# Patient Record
Sex: Female | Born: 1980 | Race: Black or African American | Hispanic: No | Marital: Single | Smoking: Never smoker
Health system: Southern US, Community
[De-identification: ages and names within clinical notes are randomized; demographics above are authoritative.]

## PROBLEM LIST (undated history)

## (undated) ENCOUNTER — Emergency Department (HOSPITAL_BASED_OUTPATIENT_CLINIC_OR_DEPARTMENT_OTHER): Admission: EM | Source: Home / Self Care

## (undated) DIAGNOSIS — R259 Unspecified abnormal involuntary movements: Secondary | ICD-10-CM

## (undated) DIAGNOSIS — J45909 Unspecified asthma, uncomplicated: Secondary | ICD-10-CM

## (undated) DIAGNOSIS — K219 Gastro-esophageal reflux disease without esophagitis: Secondary | ICD-10-CM

## (undated) DIAGNOSIS — D649 Anemia, unspecified: Secondary | ICD-10-CM

## (undated) DIAGNOSIS — F32A Depression, unspecified: Secondary | ICD-10-CM

## (undated) DIAGNOSIS — F419 Anxiety disorder, unspecified: Secondary | ICD-10-CM

## (undated) DIAGNOSIS — R209 Unspecified disturbances of skin sensation: Secondary | ICD-10-CM

## (undated) DIAGNOSIS — R202 Paresthesia of skin: Secondary | ICD-10-CM

## (undated) DIAGNOSIS — D259 Leiomyoma of uterus, unspecified: Secondary | ICD-10-CM

## (undated) DIAGNOSIS — R42 Dizziness and giddiness: Secondary | ICD-10-CM

## (undated) DIAGNOSIS — R51 Headache: Secondary | ICD-10-CM

## (undated) DIAGNOSIS — F329 Major depressive disorder, single episode, unspecified: Secondary | ICD-10-CM

## (undated) DIAGNOSIS — R251 Tremor, unspecified: Secondary | ICD-10-CM

## (undated) DIAGNOSIS — T7840XA Allergy, unspecified, initial encounter: Secondary | ICD-10-CM

## (undated) DIAGNOSIS — I10 Essential (primary) hypertension: Secondary | ICD-10-CM

## (undated) HISTORY — DX: Unspecified asthma, uncomplicated: J45.909

## (undated) HISTORY — DX: Tremor, unspecified: R25.1

## (undated) HISTORY — DX: Unspecified disturbances of skin sensation: R20.9

## (undated) HISTORY — DX: Gastro-esophageal reflux disease without esophagitis: K21.9

## (undated) HISTORY — DX: Unspecified abnormal involuntary movements: R25.9

## (undated) HISTORY — DX: Allergy, unspecified, initial encounter: T78.40XA

## (undated) HISTORY — DX: Depression, unspecified: F32.A

## (undated) HISTORY — PX: ABDOMINAL HYSTERECTOMY: SHX81

## (undated) HISTORY — DX: Dizziness and giddiness: R42

## (undated) HISTORY — DX: Headache: R51

## (undated) HISTORY — DX: Essential (primary) hypertension: I10

## (undated) HISTORY — DX: Paresthesia of skin: R20.2

## (undated) HISTORY — PX: MYOMECTOMY: SHX85

## (undated) HISTORY — PX: HERNIA REPAIR: SHX51

---
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
## 2004-12-26 ENCOUNTER — Emergency Department (HOSPITAL_COMMUNITY): Admission: EM | Admit: 2004-12-26 | Discharge: 2004-12-26 | Payer: Self-pay | Admitting: *Deleted

## 2009-08-12 ENCOUNTER — Encounter: Admission: RE | Admit: 2009-08-12 | Discharge: 2009-08-12 | Payer: Self-pay | Admitting: Family Medicine

## 2010-07-07 ENCOUNTER — Inpatient Hospital Stay (HOSPITAL_COMMUNITY)
Admission: AD | Admit: 2010-07-07 | Discharge: 2010-07-07 | Disposition: A | Discharge status: Home / Self Care | Payer: Medicaid Other | Source: Ambulatory Visit | Attending: Obstetrics & Gynecology | Admitting: Obstetrics & Gynecology

## 2010-07-07 ENCOUNTER — Inpatient Hospital Stay (HOSPITAL_COMMUNITY): Payer: Medicaid Other

## 2010-07-07 DIAGNOSIS — R109 Unspecified abdominal pain: Secondary | ICD-10-CM

## 2010-07-07 DIAGNOSIS — D259 Leiomyoma of uterus, unspecified: Secondary | ICD-10-CM

## 2010-07-07 LAB — CBC
Hemoglobin: 12.8 g/dL (ref 12.0–15.0)
MCHC: 32.8 g/dL (ref 30.0–36.0)
MCV: 88.8 fL (ref 78.0–100.0)
RBC: 4.39 MIL/uL (ref 3.87–5.11)
RDW: 12.8 % (ref 11.5–15.5)
WBC: 4.5 10*3/uL (ref 4.0–10.5)

## 2010-07-07 LAB — WET PREP, GENITAL
Trich, Wet Prep: NONE SEEN
Yeast Wet Prep HPF POC: NONE SEEN

## 2010-07-07 LAB — URINALYSIS, ROUTINE W REFLEX MICROSCOPIC
Bilirubin Urine: NEGATIVE
Nitrite: NEGATIVE
Protein, ur: NEGATIVE mg/dL
pH: 5.5 (ref 5.0–8.0)

## 2010-07-07 LAB — POCT PREGNANCY, URINE: Preg Test, Ur: NEGATIVE

## 2010-07-08 LAB — GC/CHLAMYDIA PROBE AMP, GENITAL: GC Probe Amp, Genital: NEGATIVE

## 2010-07-29 ENCOUNTER — Encounter (INDEPENDENT_AMBULATORY_CARE_PROVIDER_SITE_OTHER): Payer: Medicaid Other | Admitting: Obstetrics and Gynecology

## 2010-07-29 ENCOUNTER — Other Ambulatory Visit: Payer: Self-pay | Admitting: Obstetrics and Gynecology

## 2010-07-29 DIAGNOSIS — Z124 Encounter for screening for malignant neoplasm of cervix: Secondary | ICD-10-CM

## 2010-07-29 DIAGNOSIS — D219 Benign neoplasm of connective and other soft tissue, unspecified: Secondary | ICD-10-CM

## 2010-07-29 DIAGNOSIS — D259 Leiomyoma of uterus, unspecified: Secondary | ICD-10-CM

## 2010-07-29 DIAGNOSIS — Z01419 Encounter for gynecological examination (general) (routine) without abnormal findings: Secondary | ICD-10-CM

## 2010-08-02 ENCOUNTER — Other Ambulatory Visit: Payer: Self-pay | Admitting: Obstetrics and Gynecology

## 2010-08-02 DIAGNOSIS — D219 Benign neoplasm of connective and other soft tissue, unspecified: Secondary | ICD-10-CM

## 2010-08-03 ENCOUNTER — Other Ambulatory Visit: Payer: Medicaid Other

## 2010-08-20 ENCOUNTER — Ambulatory Visit (INDEPENDENT_AMBULATORY_CARE_PROVIDER_SITE_OTHER): Payer: Medicaid Other | Admitting: Obstetrics and Gynecology

## 2010-08-20 DIAGNOSIS — N949 Unspecified condition associated with female genital organs and menstrual cycle: Secondary | ICD-10-CM

## 2010-08-20 DIAGNOSIS — D259 Leiomyoma of uterus, unspecified: Secondary | ICD-10-CM

## 2010-08-27 NOTE — Progress Notes (Signed)
NAME:  Traci Frank, Traci Frank NO.:  [id number]  MEDICAL RECORD NO.:  [id number]           PATIENT TYPE:  A  LOCATION:  [facility]                   FACILITY:  [facility]  PHYSICIAN:  Catalina Antigua, MD     DATE OF BIRTH:  [birth year]  DATE OF SERVICE:                                 CLINIC NOTE  This is a 30 year old nulligravida patient with symptomatic fibroid uterus who has been experiencing irregular periods, but pelvic pain. The patient presents today requesting surgical intervention as to preserve her fertility.  She is currently using condoms for birth control.  PAST MEDICAL HISTORY:  Significant for borderline hypertension for which she is not taking any medication.  PAST SURGICAL HISTORY:  Significant for inguinal hernia repair.  PAST OB/GYN HISTORY:  She is nulligravida.  She denies any cyst or abnormal Pap smear.  She does have an approximately 12-13 weeks' size fibroid uterus.  SOCIAL HISTORY:  She denies drinking, smoking, or the use of illicit drugs.  FAMILY HISTORY:  Significant for diabetes and hypertension, and a great grandmother diagnosed with breast cancer.  REVIEW OF SYSTEMS:  Otherwise within normal limits.  PHYSICAL EXAMINATION:  VITAL SIGNS:  Her blood pressure is 140/92, pulse of 83, weight of 75.8 kg, height of 62 inches. LUNGS:  Clear to auscultation bilaterally. HEART:  Regular rate and rhythm. ABDOMEN:  Soft, nontender, nondistended. PELVIC:  On bimanual exam, she had approximately 16-week size palpable fibroid uterus.  No palpable adnexal masses or tenderness.  The patient had an ultrasound performed on July 07, 2010, which demonstrated the uterus measuring 13 x 7 x 5 cm with excluded a large subserosal fibroid which measured 13 x 10 x 11 cm and a second smaller fibroid which measured 4 cm.  The patient's ultrasound demonstrated normal left and right ovaries.  Endometrial lining was 11 mm.  ASSESSMENT AND PLAN:  This is a  30 year old nulligravida patient with symptomatic fibroid uterus who presents today requesting surgical intervention.  The patient was counseled regarding abdominal myomectomy. Risk, benefits, and alternatives were explained to the patient along with expected recovery time.  The patient verbalized understanding, but desires to have the procedure done robotically.  The patient was informed that she would have to reschedule the surgical consultation with Dr. Marice Potter for possible robotic procedure.  It was explained to the patient that given the large size of her fibroids, robotic procedure may or may not be possible.  The patient verbalized understanding, but still wishes to move forward with the surgical consultation with Dr. Marice Potter. The patient will be rescheduled therefore as per her request.  In the meantime, the patient was advised to follow up with her primary care physician agent for further evaluation of her blood pressure.          ______________________________ Catalina Antigua, MD    PC/MEDQ  D:  08/20/2010  T:  08/21/2010  Job:  [id number]

## 2010-08-27 NOTE — Progress Notes (Unsigned)
[name], [name] NO.:  [id number]  MEDICAL RECORD NO.:  [id number]           PATIENT TYPE:  A  LOCATION:  [facility]                   FACILITY:  [facility]  PHYSICIAN:  Argentina Donovan, MD        DATE OF BIRTH:  February 22, 1981  DATE OF SERVICE:  07/29/2010                                 CLINIC NOTE  The patient is a 30 year old, nulligravida, African American female who was referred by the [facility] because of pelvic pain and fibroids.  On examination, she has normal periods with a hemoglobin of 12.8.  She has variable abdominal pain and is otherwise in good health with a borderline high blood pressure.  At the present time, she is on no medication except Naprosyn which she takes for the cramps and pain.  She uses condoms for birth control and has an allergy to LATEX.  On examination, the abdomen is soft with an obvious bulge below the umbilicus with a mass that extends from the pelvis up to the umbilicus. It seems firm and regular in configuration.  And the external genitalia is normal.  BUS within normal limits.  Vagina is clean and well rugated. Pap smear was taken.  The cervix is closed and nulliparous and uterus feels like about an 18-week pregnancy.  Ultrasound shows an uterus measuring 12 x 7 x 5 with a large left-sided fibroid subserosal arising from the left uterine body which measures 39 x 10 x 11.6 and a second smaller fibroid seen in the right uterine body measuring 3.8.  The endometrium was fine and normal and both ovaries were normal.  Being that the patient has these large fibroids, we have talked to her about uterine artery embolizations and myomectomy since she is a nulligravida and desires children, she says at least one.  I am going to send her for a consultation with the interventional radiologist, have her then come back and see a surgeon unless she just therefore chooses to have the procedure done by Radiology and the Surgery.  Whether or not to use  Depo-Lupron I will leave up to the choice of the surgeon.  IMPRESSION:  Symptomatic leiomyomata uteri in a nulligravida female.          ______________________________ Argentina Donovan, MD    PR/MEDQ  D:  07/29/2010  T:  07/30/2010  Job:  [id number]

## 2010-09-06 ENCOUNTER — Ambulatory Visit: Payer: Medicaid Other | Admitting: Obstetrics & Gynecology

## 2013-05-27 ENCOUNTER — Encounter: Payer: Self-pay | Admitting: Neurology

## 2013-05-27 ENCOUNTER — Ambulatory Visit (INDEPENDENT_AMBULATORY_CARE_PROVIDER_SITE_OTHER): Payer: 59 | Admitting: Neurology

## 2013-05-27 ENCOUNTER — Telehealth: Payer: Self-pay | Admitting: Neurology

## 2013-05-27 ENCOUNTER — Encounter (INDEPENDENT_AMBULATORY_CARE_PROVIDER_SITE_OTHER): Payer: Self-pay

## 2013-05-27 VITALS — BP 156/102 | HR 78 | Temp 98.8°F | Ht 62.0 in | Wt 189.0 lb

## 2013-05-27 DIAGNOSIS — R202 Paresthesia of skin: Secondary | ICD-10-CM | POA: Insufficient documentation

## 2013-05-27 DIAGNOSIS — R259 Unspecified abnormal involuntary movements: Secondary | ICD-10-CM

## 2013-05-27 DIAGNOSIS — R253 Fasciculation: Secondary | ICD-10-CM

## 2013-05-27 DIAGNOSIS — R209 Unspecified disturbances of skin sensation: Secondary | ICD-10-CM

## 2013-05-27 DIAGNOSIS — R42 Dizziness and giddiness: Secondary | ICD-10-CM

## 2013-05-27 DIAGNOSIS — R51 Headache: Secondary | ICD-10-CM

## 2013-05-27 DIAGNOSIS — R251 Tremor, unspecified: Secondary | ICD-10-CM | POA: Insufficient documentation

## 2013-05-27 HISTORY — DX: Tremor, unspecified: R25.1

## 2013-05-27 HISTORY — DX: Paresthesia of skin: R20.2

## 2013-05-27 NOTE — Telephone Encounter (Signed)
Please call Dr. Delena Serve with [facility] to request lab test results. Please also fax my note to Dr. Delena Serve from today, thx

## 2013-05-27 NOTE — Patient Instructions (Addendum)
I think overall you are doing fairly well but I do want to suggest a few things today:  Remember to drink plenty of fluid, eat healthy meals and do not skip any meals. Try to eat protein with a every meal and eat a healthy snack such as fruit or nuts in between meals. Try to keep a regular sleep-wake schedule and try to exercise daily, particularly in the form of walking, 20-30 minutes a day, if you can.   Please remember, that any kind of tremor may be exacerbated by anxiety, anger, nervousness, excitement, dehydration, sleep deprivation, by caffeine, and low blood sugar values or blood sugar fluctuations. Some medications, especially some antidepressants and lithium can cause or exacerbate tremors. Tremors may temporarily calm down her subside with the use of a benzodiazepine such as Valium or related medications and with alcohol. Be aware however that drinking alcohol is not an approved treatment or appropriate treatment for tremor control and long-term use of benzodiazepines such as Valium, lorazepam, alprazolam, or clonazepam can cause habit formation, physical and psychological addiction.  As far as your medications are concerned, I would like to suggest no new medications.    As far as diagnostic testing: MRI brain with and without contrast. We will request blood test results from your family doctor. Please call us with the name of the provider you saw, so I can also send my note to your new PCP.  I would like to see you back in 3 months, sooner if we need to. Please call us with any interim questions, concerns, problems, updates or refill requests.  Please also call us for any test results so we can go over those with you on the phone. Brett Canales is my clinical assistant and will answer any of your questions and relay your messages to me and also relay most of my messages to you.  Our phone number is [phone]. We also have an after hours call service for urgent matters and there is a physician  on-call for urgent questions. For any emergencies you know to call 911 or go to the nearest emergency room.

## 2013-05-27 NOTE — Progress Notes (Signed)
Subjective:    Patient ID: Traci Frank is a 32 y.o. female.  HPI    Huston Foley, MD, PhD [facility] [address], [address] [address] [place], [place] [address]  Dear Dr. Emily Filbert,  I saw your patient, Traci Frank, upon your kind request in my neurologic clinic today for initial consultation of her dizziness, tingling, tremors. The patient is unaccompanied today. As you know, Traci Frank is a 32 year old right-handed woman with a benign underlying medical history who started having dizziness earlier this month. This was described as vertiginous and associated with headache and nausea. She has had paresthesias in her right arm and also noted that trembling in her right arm. She reported no eye pain or visual disturbance and no one-sided weakness. For her headache you gave her a prescription for hydrocodone and acetaminophen and for her nausea she was given a prescription for Zofran. She started having facial tingling last year, and it was intermittent. She also developed a right hand tremor around the same time. Sx became worse in the past 3 weeks and she has noted pain in her L arm. She also went to [facility] last week and they did blood work. She currently has all over tingling and R hand tremor.   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 She lives with her cousin. She has not had any new stressors. She has had a recurrent HA, bifrontal or around both eyes, associated with nausea, no vomiting, 3 times a week, lasting for 6 hours, Ibuprofen 600 mg helps. She describes the HA as throbbing and no aura. Her vertigo has improved on its own. She has had muscle twitching in various areas of the body.   Her Past Medical History Is Significant For: Past Medical History  Diagnosis Date  . Disturbance of skin sensation     Right arm  . Abnormal involuntary movements(781.0)     Right hand  . Headache(784.0)   . Vertigo     Her  Past Surgical History Is Significant For: Past Surgical History  Procedure Laterality Date  . Myomectomy      Her Family History Is Significant For: Family History  Problem Relation Age of Onset  . Diabetes Mother   . Hypertension Mother     Her Social History Is Significant For: History   Social History  . Marital Status: Single    Spouse Name: N/A    Number of Children: N/A  . Years of Education: N/A   Social History Main Topics  . Smoking status: Never Smoker   . Smokeless tobacco: None  . Alcohol Use: 0.0 oz/week    .25 drink(s) per week  . Drug Use: No  . Sexual Activity: None   Other Topics Concern  . None   Social History Narrative  . None    Her Allergies Are:  Allergies  Allergen Reactions  . Latex Itching  :   Her Current Medications Are:  Outpatient Encounter Prescriptions as of 05/27/2013  Medication Sig  . ibuprofen (ADVIL,MOTRIN) 200 MG tablet Take 200 mg by mouth every 6 (six) hours as needed.   Review of Systems:  Out of a complete 14 point review of systems, all are reviewed and negative with the exception of these symptoms as listed below:   Review of Systems  Constitutional: Positive for fatigue.  HENT: Positive for rhinorrhea and tinnitus.   Eyes: Negative.   Respiratory: Negative.   Cardiovascular:  Negative.   Gastrointestinal: Negative.   Endocrine: Negative.   Genitourinary: Negative.   Musculoskeletal: Positive for myalgias.  Skin: Negative.   Allergic/Immunologic: Negative.   Neurological: Positive for tremors, numbness and headaches.  Hematological: Negative.   Psychiatric/Behavioral: Positive for sleep disturbance (insomnia, snoring,shift work,sleepiness).    Objective:  Neurologic Exam  Physical Exam Physical Examination:   Filed Vitals:   05/27/13 0934  BP: 156/102  Pulse: 78  Temp: 98.8 F (37.1 C)    General Examination: The patient is a very pleasant 32 y.o. female in no acute distress. She appears  well-developed and well-nourished and well groomed. She is mildly anxious appearing.   HEENT: Normocephalic, atraumatic, pupils are equal, round and reactive to light and accommodation. Funduscopic exam is normal with sharp disc margins noted. Extraocular tracking is good without limitation to gaze excursion or nystagmus noted. Normal smooth pursuit is noted. Hearing is grossly intact. Tympanic membranes are clear bilaterally. Face is symmetric with normal facial animation and normal facial sensation. Speech is clear with no dysarthria noted. There is no hypophonia. There is no lip, neck/head, jaw or voice tremor. Neck is supple with full range of passive and active motion. There are no carotid bruits on auscultation. Oropharynx exam reveals: mild mouth dryness, adequate dental hygiene and mild airway crowding. Mallampati is class II. Tongue protrudes centrally and palate elevates symmetrically. Tonsils are 1+ in size.    Chest: Clear to auscultation without wheezing, rhonchi or crackles noted.  Heart: S1+S2+0, regular and normal without murmurs, rubs or gallops noted.   Abdomen: Soft, non-tender and non-distended with normal bowel sounds appreciated on auscultation.  Extremities: There is no pitting edema in the distal lower extremities bilaterally. Pedal pulses are intact.  Skin: Warm and dry without trophic changes noted. There are no varicose veins.  Musculoskeletal: exam reveals no obvious joint deformities, tenderness or joint swelling or erythema.   Neurologically:  Mental status: The patient is awake, alert and oriented in all 4 spheres. Her memory, attention, language and knowledge are appropriate. There is no aphasia, agnosia, apraxia or anomia. Speech is clear with normal prosody and enunciation. Thought process is linear. Mood is congruent and affect is normal.  Cranial nerves are as described above under HEENT exam. In addition, shoulder shrug is normal with equal shoulder height  noted. Motor exam: Normal bulk, strength and tone is noted. There is no drift, resting tremor or rebound. There is a RUE mild postural and action tremor, this is intermittent. There tremor frequency is fairly fast and the amplitude is small. On Archimedes spiral drawing there is mild tremulousness noted only on the right Handwriting is mildly tremulousness, but legible. There is no evidence of micrographia.  Romberg is negative. Reflexes are 2+ throughout. Toes are downgoing bilaterally. Fine motor skills are intact with normal finger taps, normal hand movements, normal rapid alternating patting, normal foot taps and normal foot agility. The only finding as she has mild hesitation with finger tapping on the right. There is no decrement in amplitude. Cerebellar testing shows no dysmetria or intention tremor on finger to nose testing. Heel to shin is unremarkable bilaterally. There is no truncal or gait ataxia.  Sensory exam is intact to light touch, pinprick, vibration, temperature sense in the upper and lower extremities.  Gait, station and balance are unremarkable. No veering to one side is noted. No leaning to one side is noted. Posture is age-appropriate and stance is narrow based. No problems turning are noted. She turns en bloc.  Tandem walk is unremarkable. Intact toe and heel stance is noted.               Assessment and Plan:   In summary, Traci Frank is a very pleasant 32 y.o.-year old female with a history of tremor, dizziness, tingling, and recurrent headaches. Her exam is for the most part non-focal with the exception of an intermittent mild right upper extremity tremor with action and posture. This is a faster tremor, with smaller amplitude. I reassured the patient in that she does not have any other tremors or focal findings were parkinsonian signs.   I had a long chat with the patient about my findings and her symptoms. I am not sure how to tie this all together at this time. I advised  her that we will try to get blood work results from her family physician at [facility]. She is going to call his back with the name of the provider she saw. She does indicate she has had thyroid function tests and blood chemistry as well as CBC at their office. I would like to proceed with a brain MRI with and without contrast as differential diagnosis is broad in her case. She could have complicated migraines, symptoms of MS, TIA is less likely and also manifestation of stress. We will call her back with the MRI results as well as additional blood work that may need to be done. I will hold off on doing blood work just yet. We will make an appointment for her for the MRI and for me to see her back in followup. In the interim, based on the available test results if we need to do additional testing such as spinal fluid testing or C-spine MRI we will proceed with that as well. She was in agreement.  As far as medications are concerned, I recommended the following at this time: I did not recommend any new medication.  I answered all her questions today.  Thank you very much for allowing me to participate in the care of this nice patient. If I can be of any further assistance to you please do not hesitate to call me at [phone].  Sincerely,   Huston Foley, MD, PhD

## 2013-05-27 NOTE — Telephone Encounter (Signed)
FYI--WAS SEEN THIS MORNING--PHYSICIAN W/[facility] @ [facility] IS DR. Toni Amend [place] WHERE SHE HAD LAB WORK DONE.

## 2013-06-04 ENCOUNTER — Telehealth: Payer: Self-pay | Admitting: Neurology

## 2013-06-04 NOTE — Telephone Encounter (Signed)
Addendum:  Patient was called back and told she needed to stop by the office to sign a records release form and pay the $20 fee before processing. Patient stated she would stop by Monday morning.

## 2013-06-04 NOTE — Telephone Encounter (Signed)
Patient requesting FMLA papers be filled out before MRI is scheduled as her insurance company called requesting paperwork. Patient is faxing over FMLA papers to [place] today. Please call back to advise.

## 2013-06-10 ENCOUNTER — Other Ambulatory Visit: Payer: Medicaid Other

## 2013-06-13 ENCOUNTER — Ambulatory Visit
Admission: RE | Admit: 2013-06-13 | Discharge: 2013-06-13 | Disposition: A | Discharge status: Home / Self Care | Payer: 59 | Source: Ambulatory Visit | Attending: Neurology | Admitting: Neurology

## 2013-06-13 DIAGNOSIS — R519 Headache, unspecified: Secondary | ICD-10-CM

## 2013-06-13 DIAGNOSIS — R251 Tremor, unspecified: Secondary | ICD-10-CM

## 2013-06-13 DIAGNOSIS — R253 Fasciculation: Secondary | ICD-10-CM

## 2013-06-13 DIAGNOSIS — R42 Dizziness and giddiness: Secondary | ICD-10-CM

## 2013-06-13 DIAGNOSIS — R209 Unspecified disturbances of skin sensation: Secondary | ICD-10-CM

## 2013-06-13 DIAGNOSIS — R51 Headache: Secondary | ICD-10-CM

## 2013-06-13 DIAGNOSIS — R202 Paresthesia of skin: Secondary | ICD-10-CM

## 2013-06-13 MED ORDER — GADOBENATE DIMEGLUMINE 529 MG/ML IV SOLN
18.0000 mL | Freq: Once | INTRAVENOUS | Status: AC | PRN
  Administered 2013-06-13: 18 mL via INTRAVENOUS

## 2013-06-14 DIAGNOSIS — Z0289 Encounter for other administrative examinations: Secondary | ICD-10-CM

## 2013-06-17 NOTE — Progress Notes (Signed)
Quick Note:  Please call and advise the patient that the recent scan we did was within normal limits. We did a brain MRI with and without contrast, which showed normal findings. In particular, there were no acute findings, such as a stroke, or mass or blood products or abnormal contrast uptake. No further action is required on this test at this time. Please remind patient to keep any upcoming appointments or tests and to call us with any interim questions, concerns, problems or updates. Thanks,  Star Age, MD, PhD   ______

## 2013-06-18 ENCOUNTER — Telehealth: Payer: Self-pay

## 2013-06-18 NOTE — Telephone Encounter (Signed)
Message copied by [facility] on Tue Jun 18, 2013 11:41 AM ------      Message from: Star Age      Created: Mon Jun 17, 2013  5:33 PM       Please call and advise the patient that the recent scan we did was within normal limits. We did a brain MRI with and without contrast, which showed normal findings. In particular, there were no acute findings, such as a stroke, or mass or blood products or abnormal contrast uptake. No further action is required on this test at this time. Please remind patient to keep any upcoming appointments or tests and to call us with any interim questions, concerns, problems or updates. Thanks,       Star Age, MD, PhD             ------

## 2013-06-18 NOTE — Telephone Encounter (Signed)
Patient received call from our office without message. We reviewed the results of her MRI. Patient stated that her symptoms have not improved. She stated that she would see about seeing a different doctor to see what they can find. I reviewed last OV note to see what Dr. Guadelupe Sabin plan was for this patient. I see that patient was going to get back top [place] with the name of a new primary care physician. I asked the patient about this. She stated that she was going to go with [employer]. I urged her to call us when she had the name of the physician who would see her. We can then forward out findings and notes and they can manage her care. That for her to bounce from doctor to doctor to find out the root cause of her symptoms is not prudent. Patient agrees to do this.  I reviewed with her that she is scheduled for a follow up on Oct 10 2013 at 2:30 p.m.

## 2013-06-24 ENCOUNTER — Telehealth: Payer: Self-pay

## 2013-06-24 NOTE — Telephone Encounter (Signed)
I spoke with Dr. Rexene Alberts about patient request to complete FMLA forms. She reviewed last office note and felt there was no justification for FMLA. Dr. Rexene Alberts stated she would be glad to have patient come in and she would re-evaluate her.   I called the patient to ascertain what was the purpose of the FMLA forms. Patient stated that she has been sent home from work a number of times and to Urgent Care because of her ongoing symptoms. She said they kept her out of work for the rest of that particular week. She needs FMLA so she won't get written up at work. I let her know we could not complete these forms at this time with the information from Dr. Tori Milks last assessment. I asked patient if she would like to schedule a sooner appointment and Dr. Rexene Alberts can re-evaluate her. Patient stated that she is having the same symptoms, nothing has changed. I asked if she could get the form completed by the Doctor at Urgent Care, since that is the person who took her out on any leave. Patient stated that they would not do it and advised them to have Neurologist complete form.   She will find someone else to give her a second opinion.   Patient asked if she could come pick up her forms and get a refund. I let her know I will leave forms up front for her to pick up. I will notify billing and they will make a request to have refund sent to patient.

## 2013-06-24 NOTE — Telephone Encounter (Signed)
We are unable to complete FMLA forms, based on finding of last (first) office visit. I have notified patient and she is requesting refund of her payment.

## 2013-06-24 NOTE — Telephone Encounter (Signed)
Note reviewed.

## 2013-10-10 ENCOUNTER — Ambulatory Visit: Payer: 59 | Admitting: Neurology

## 2014-01-27 ENCOUNTER — Other Ambulatory Visit: Payer: Self-pay | Admitting: Family Medicine

## 2014-01-27 ENCOUNTER — Other Ambulatory Visit (HOSPITAL_COMMUNITY)
Admission: RE | Admit: 2014-01-27 | Discharge: 2014-01-27 | Disposition: A | Discharge status: Home / Self Care | Payer: 59 | Source: Ambulatory Visit | Attending: Family Medicine | Admitting: Family Medicine

## 2014-01-27 DIAGNOSIS — Z124 Encounter for screening for malignant neoplasm of cervix: Secondary | ICD-10-CM | POA: Diagnosis not present

## 2014-01-27 DIAGNOSIS — Z1151 Encounter for screening for human papillomavirus (HPV): Secondary | ICD-10-CM | POA: Diagnosis present

## 2014-01-28 ENCOUNTER — Other Ambulatory Visit: Payer: Self-pay | Admitting: Family Medicine

## 2014-01-28 DIAGNOSIS — R202 Paresthesia of skin: Principal | ICD-10-CM

## 2014-01-28 DIAGNOSIS — R200 Anesthesia of skin: Secondary | ICD-10-CM

## 2014-01-29 LAB — CYTOLOGY - PAP

## 2014-02-17 ENCOUNTER — Ambulatory Visit
Admission: RE | Admit: 2014-02-17 | Discharge: 2014-02-17 | Disposition: A | Discharge status: Home / Self Care | Payer: 59 | Source: Ambulatory Visit | Attending: Family Medicine | Admitting: Family Medicine

## 2014-02-17 DIAGNOSIS — R200 Anesthesia of skin: Secondary | ICD-10-CM

## 2014-02-17 DIAGNOSIS — R202 Paresthesia of skin: Principal | ICD-10-CM

## 2014-03-17 ENCOUNTER — Ambulatory Visit: Payer: 59 | Admitting: Neurology

## 2014-03-17 ENCOUNTER — Telehealth: Payer: Self-pay | Admitting: Neurology

## 2014-03-17 NOTE — Telephone Encounter (Signed)
Pt no showed today's appt due to car trouble. Dr. Tomi Likens gave OK to r/s appt / Sherri S.

## 2014-04-28 ENCOUNTER — Encounter: Payer: Self-pay | Admitting: Neurology

## 2014-04-28 ENCOUNTER — Ambulatory Visit (INDEPENDENT_AMBULATORY_CARE_PROVIDER_SITE_OTHER): Payer: 59 | Admitting: Neurology

## 2014-04-28 VITALS — BP 140/92 | HR 78 | Temp 98.2°F | Resp 20 | Ht 62.0 in | Wt 187.2 lb

## 2014-04-28 DIAGNOSIS — R202 Paresthesia of skin: Secondary | ICD-10-CM

## 2014-04-28 DIAGNOSIS — M62838 Other muscle spasm: Secondary | ICD-10-CM

## 2014-04-28 DIAGNOSIS — R251 Tremor, unspecified: Secondary | ICD-10-CM

## 2014-04-28 LAB — CK: CK TOTAL: 85 U/L (ref 7–177)

## 2014-04-28 LAB — FOLATE: FOLATE: 11.4 ng/mL

## 2014-04-28 LAB — MAGNESIUM: MAGNESIUM: 1.9 mg/dL (ref 1.5–2.5)

## 2014-04-28 NOTE — Progress Notes (Signed)
NEUROLOGY CONSULTATION NOTE  Traci Frank MRN: [id number] DOB: 25-Apr-1981  Referring provider: Ms. Rolland Porter Primary care provider: Ms. Rolland Porter  Reason for consult:  Tremor, paresthesias  HISTORY OF PRESENT ILLNESS: Traci Frank is a 33 year old right-handed woman with headache  who presents for tremor and numbness and tingling.  Records and images personally reviewed.  In late 2014, she began experiencing several symptoms.  She developed a tremor in the right hand.  Her right arm would start feeling tired and then the hand would start to tremor.  It would occur spontaneously and was not triggered by any particular activity, however it caused difficulty typing at work.  At first, it would last up to 5 hours, however it is now quite infrequent and only occurs once in awhile.  Now, she says her right hand usually feels ice-cold at times.  There is no associated neck pain or pain radiating down the arm.  There is no weakness.  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  She had an ultrasound which revealed no mass.  She also reports tingling of the entire body.  At first, it only involved the cheeks, but then spread to involve the entire body.  Symptoms are constant.  There is no associated pain or numbness. She also experiences muscle jerks or spasms, most pronounced when she is inactive in bed.  It would either present as small spasms of different muscles to jerking of the entire limb.  She was evaluated at [facility] in December 2014.  An MRI of the brain with and without contrast was performed on 06/17/13, which was normal.  Labs from December 2014 showed normal CBC and CMP.  B12 was 297 and TSH was 1.33.  A diagnosis was never made.  She reports that she has some family members with history of tremor.  PAST MEDICAL HISTORY: Past Medical History  Diagnosis Date    . Disturbance of skin sensation     Right arm  . Abnormal involuntary movements(781.0)     Right hand  . Headache(784.0)   . Vertigo   . Tingling sensation 05/27/2013  . Tremor of unknown origin 05/27/2013    PAST SURGICAL HISTORY: Past Surgical History  Procedure Laterality Date  . Myomectomy      MEDICATIONS: Current Outpatient Prescriptions on File Prior to Visit  Medication Sig Dispense Refill  . ibuprofen (ADVIL,MOTRIN) 200 MG tablet Take 200 mg by mouth every 6 (six) hours as needed.     No current facility-administered medications on file prior to visit.    ALLERGIES: Allergies  Allergen Reactions  . Latex Itching    FAMILY HISTORY: Family History  Problem Relation Age of Onset  . Hypertension Mother   . Diabetes Maternal Grandmother   . Diabetes Maternal Grandfather   . Stroke Paternal Grandfather     SOCIAL HISTORY: History   Social History  . Marital Status: Single    Spouse Name: N/A    Number of Children: N/A  . Years of Education: N/A   Occupational History  . Not on file.   Social History Main Topics  . Smoking status: Never Smoker   . Smokeless tobacco: Never Used  . Alcohol Use: 0.0 oz/week    0 Not specified per week     Comment: 1 drink a month  . Drug Use: No  . Sexual Activity:  Partners: Male   Other Topics Concern  . Not on file   Social History Narrative    REVIEW OF SYSTEMS: Constitutional: No fevers, chills, or sweats, no generalized fatigue, change in appetite Eyes: No visual changes, double vision, eye pain Ear, nose and throat: No hearing loss, ear pain, nasal congestion, sore throat Cardiovascular: No chest pain, palpitations Respiratory:  No shortness of breath at rest or with exertion, wheezes GastrointestinaI: No nausea, vomiting, diarrhea, abdominal pain, fecal incontinence Genitourinary:  No dysuria, urinary retention or frequency Musculoskeletal:  No neck pain, back pain Integumentary: No rash,  pruritus, skin lesions Neurological: as above Psychiatric: No depression, insomnia, anxiety Endocrine: No palpitations, fatigue, diaphoresis, mood swings, change in appetite, change in weight, increased thirst Hematologic/Lymphatic:  No anemia, purpura, petechiae. Allergic/Immunologic: no itchy/runny eyes, nasal congestion, recent allergic reactions, rashes  PHYSICAL EXAM: Filed Vitals:   04/28/14 1306  BP: 140/92  Pulse: 78  Temp: 98.2 F (36.8 C)  Resp: 20   General: No acute distress Head:  Normocephalic/atraumatic Eyes:  fundi unremarkable, without vessel changes, exudates, hemorrhages or papilledema. CN III, IV, VI:  full range of motion, no nystagmus, no ptosis Neck: supple, no paraspinal tenderness, full range of motion Back: No paraspinal tenderness Heart: regular rate and rhythm Lungs: Clear to auscultation bilaterally. Vascular: No carotid bruits. Neurological Exam: Mental status: alert and oriented to person, place, and time, recent and remote memory intact, fund of knowledge intact, attention and concentration intact, speech fluent and not dysarthric, language intact. Cranial nerves: CN I: not tested CN II: pupils equal, round and reactive to light, visual fields intact, fundi unremarkable, without vessel changes, exudates, hemorrhages or papilledema. CN III, IV, VI:  full range of motion, no nystagmus, no ptosis CN V: facial sensation intact CN VII: upper and lower face symmetric CN VIII: hearing intact CN IX, X: gag intact, uvula midline CN XI: sternocleidomastoid and trapezius muscles intact CN XII: tongue midline Bulk & Tone: normal, no fasciculations. Motor:  5/5 throughout Sensation:  Reduced pinprick over dorsum of right hand and (insignificant) left arm.  Vibration intact. Deep Tendon Reflexes:  2+ throughout, toes downgoing Finger to nose testing:  No dysmetria Gait:  Normal station and stride.  Able to turn and walk in tandem. Romberg  negative.  IMPRESSION: Intermittent right hand tremor, not present on evaluation Muscle spasms or jerks Paresthesias involving the entire body  PLAN: 1. Will get NCV-EMG of the right upper and lower extremities. 2.  Check CK, Mg, folate 3.  Follow up after tests.  Thank you for allowing me to take part in the care of this patient.  Metta Clines, DO  CC:  Marda Stalker, PA

## 2014-04-28 NOTE — Patient Instructions (Signed)
Not really sure at this time what the cause is. 1.  We will check CK, Mg, and folate 2.  We will check a nerve conduction study of the right upper and lower extremities  3.  Follow up afterwards.

## 2014-04-29 ENCOUNTER — Telehealth: Payer: Self-pay | Admitting: *Deleted

## 2014-04-29 NOTE — Telephone Encounter (Signed)
-----   Message from Dudley Major, DO sent at 04/29/2014  7:26 AM EST ----- Labs are okay ----- Message -----    From: Lab in [facility] Interface    Sent: 04/28/2014  10:17 PM      To: Dudley Major, DO

## 2014-04-29 NOTE — Telephone Encounter (Signed)
Patient is aware of normal labs  

## 2014-06-12 ENCOUNTER — Ambulatory Visit (INDEPENDENT_AMBULATORY_CARE_PROVIDER_SITE_OTHER): Payer: 59 | Admitting: Neurology

## 2014-06-12 DIAGNOSIS — R202 Paresthesia of skin: Secondary | ICD-10-CM

## 2014-06-12 DIAGNOSIS — M62838 Other muscle spasm: Secondary | ICD-10-CM

## 2014-06-12 DIAGNOSIS — R251 Tremor, unspecified: Secondary | ICD-10-CM

## 2014-06-12 NOTE — Procedures (Signed)
[facility]  [address], [address]  [place], [place] [address] Tel: [phone] Fax:  [phone] Test Date:  06/12/2014  Patient: Traci Frank DOB: 07-Jun-1980 Physician: Narda Amber  Sex: Female Height: 5\' 2"  Ref Phys: Metta Clines  ID#: [id number] Temp: 32.0C Technician: Laureen Ochs R. NCS T.   Patient Complaints: Patient is a 34 year old female here for evaluation of whole-body paresthesias and muscle spasms.  NCV & EMG Findings: Extensive electrodiagnostic testing of the right upper and lower extremity shows: 1. Right median, ulnar, radial, and palmar sensory responses are within normal limits.  2. Right median and ulnar motor responses are within normal limits.  3. Right sural, superficial peroneal, and medial plantar response is within normal limits. 4. Right peroneal and tibial motor responses are within normal limits. 5. No evidence of active or chronic motor axon loss changes affecting any of the tested muscles. Motor unit configuration and recruitment pattern is normal.  Impression: This is a normal study of the right upper and lower extremity.  In particular, there is no evidence of a generalized sensorimotor polyneuropathy, cervical/lumbosacral radiculopathy, carpal tunnel syndrome, or diffuse myopathy.   ___________________________ Narda Amber    Nerve Conduction Studies Anti Sensory Summary Table   Stim Site NR Peak (ms) Norm Peak (ms) P-T Amp (V) Norm P-T Amp  Right Median Anti Sensory (2nd Digit)  32C  Wrist    3.1 <3.4 73.1 >20  Right Radial Anti Sensory (Base 1st Digit)  32C  Wrist    2.3 <2.7 48.4 >18  Right Sup Peroneal Anti Sensory (Ant Lat Mall)  32C  12 cm    2.4 <4.5 18.3 >5  Right Sural Anti Sensory (Lat Mall)  32C  Calf    3.0 <4.5 12.3 >5  Right Ulnar Anti Sensory (5th Digit)  32C  Wrist    2.6 <3.1 65.7 >12   Ortho Sensory Summary Table   Stim Site NR Peak (ms) Norm Peak (ms) P-T Amp (V) Norm P-T Amp  Right Medial  Plantar Ortho Sensory (Med Malleolus)  32C  Digit 1    3.4  20.3    Motor Summary Table   Stim Site NR Onset (ms) Norm Onset (ms) O-P Amp (mV) Norm O-P Amp Site1 Site2 Delta-0 (ms) Dist (cm) Vel (m/s) Norm Vel (m/s)  Right Median Motor (Abd Poll Brev)  32C  Wrist    3.3 <3.9 17.3 >6 Elbow Wrist 4.2 27.0 64 >50  Elbow    7.5  16.1         Right Peroneal Motor (Ext Dig Brev)  32C  Ankle    4.0 <5.5 7.5 >3 B Fib Ankle 5.6 28.0 50 >40  B Fib    9.6  6.9  Poplt B Fib 1.9 9.0 47 >40  Poplt    11.5  7.0         Right Peroneal TA Motor (Tib Ant)  32C  Fib Head    2.8 <4.0 6.5 >4 Poplit Fib Head 0.9 6.0 67 >40  Poplit    3.7  6.2         Right Tibial Motor (Abd Hall Brev)  32C  Ankle    4.1 <6.0 14.0 >8 Knee Ankle 6.8 35.0 51 >40  Knee    10.9  13.7         Right Ulnar Motor (Abd Dig Minimi)  32C  Wrist    3.1 <3.1 12.9 >7 B Elbow Wrist 3.7 21.0 57 >50  B Elbow    6.8  12.3  A Elbow B Elbow 1.5 10.0 67 >50  A Elbow    8.3  12.3          Comparison Summary Table   Stim Site NR Peak (ms) Norm Peak (ms) P-T Amp (V) Site1 Site2 Delta-P (ms) Norm Delta (ms)  Right Median/Ulnar Palm Comparison (Wrist - 8cm)  32C  Median Palm    1.7 <2.2 87.5 Median Palm Ulnar Palm 0.0   Ulnar Palm    1.7 <2.2 15.6       F Wave Studies   NR F-Lat (ms) Lat Norm (ms) L-R F-Lat (ms)  Right Ulnar (Mrkrs) (Abd Dig Min)  32C     24.71 <33    EMG   Side Muscle Ins Act Fibs Psw Fasc Number Recrt Dur Dur. Amp Amp. Poly Poly. Comment  Right AntTibialis Nml Nml Nml Nml Nml Nml Nml Nml Nml Nml Nml Nml N/A  Right Gastroc Nml Nml Nml Nml Nml Nml Nml Nml Nml Nml Nml Nml N/A  Right Flex Dig Long Nml Nml Nml Nml Nml Nml Nml Nml Nml Nml Nml Nml N/A  Right RectFemoris Nml Nml Nml Nml Nml Nml Nml Nml Nml Nml Nml Nml N/A  Right BicepsFemS Nml Nml Nml Nml Nml Nml Nml Nml Nml Nml Nml Nml N/A  Right GluteusMed Nml Nml Nml Nml Nml Nml Nml Nml Nml Nml Nml Nml N/A  Right 1stDorInt Nml Nml Nml Nml Nml Nml Nml Nml Nml Nml  Nml Nml N/A  Right Ext Indicis Nml Nml Nml Nml Nml Nml Nml Nml Nml Nml Nml Nml N/A  Right PronatorTeres Nml Nml Nml Nml Nml Nml Nml Nml Nml Nml Nml Nml N/A  Right Abd Poll Brev Nml Nml Nml Nml Nml Nml Nml Nml Nml Nml Nml Nml N/A  Right Biceps Nml Nml Nml Nml Nml Nml Nml Nml Nml Nml Nml Nml N/A  Right Triceps Nml Nml Nml Nml Nml Nml Nml Nml Nml Nml Nml Nml N/A  Right Deltoid Nml Nml Nml Nml Nml Nml Nml Nml Nml Nml Nml Nml N/A      Waveforms:

## 2014-06-13 ENCOUNTER — Telehealth: Payer: Self-pay | Admitting: *Deleted

## 2014-06-13 NOTE — Telephone Encounter (Signed)
Patient is aware of normal eeg

## 2014-06-13 NOTE — Telephone Encounter (Signed)
-----   Message from Dudley Major, DO sent at 06/13/2014  6:39 AM EST ----- ncv-emg normal ----- Message -----    From: Alda Berthold, DO    Sent: 06/12/2014   2:32 PM      To: Dudley Major, DO

## 2016-07-05 DIAGNOSIS — I10 Essential (primary) hypertension: Secondary | ICD-10-CM | POA: Insufficient documentation

## 2016-07-05 DIAGNOSIS — K5904 Chronic idiopathic constipation: Secondary | ICD-10-CM | POA: Insufficient documentation

## 2018-01-11 DIAGNOSIS — R198 Other specified symptoms and signs involving the digestive system and abdomen: Secondary | ICD-10-CM | POA: Insufficient documentation

## 2018-01-11 DIAGNOSIS — Z86018 Personal history of other benign neoplasm: Secondary | ICD-10-CM | POA: Insufficient documentation

## 2018-12-16 DIAGNOSIS — F322 Major depressive disorder, single episode, severe without psychotic features: Secondary | ICD-10-CM | POA: Insufficient documentation

## 2019-02-01 ENCOUNTER — Ambulatory Visit (INDEPENDENT_AMBULATORY_CARE_PROVIDER_SITE_OTHER): Payer: Self-pay | Admitting: Psychiatry

## 2019-02-01 ENCOUNTER — Other Ambulatory Visit: Payer: Self-pay

## 2019-02-01 ENCOUNTER — Encounter (HOSPITAL_COMMUNITY): Payer: Self-pay | Admitting: Psychiatry

## 2019-02-01 DIAGNOSIS — F410 Panic disorder [episodic paroxysmal anxiety] without agoraphobia: Secondary | ICD-10-CM

## 2019-02-01 DIAGNOSIS — F3342 Major depressive disorder, recurrent, in full remission: Secondary | ICD-10-CM | POA: Insufficient documentation

## 2019-02-01 DIAGNOSIS — F411 Generalized anxiety disorder: Secondary | ICD-10-CM

## 2019-02-01 DIAGNOSIS — F331 Major depressive disorder, recurrent, moderate: Secondary | ICD-10-CM

## 2019-02-01 DIAGNOSIS — F3341 Major depressive disorder, recurrent, in partial remission: Secondary | ICD-10-CM | POA: Insufficient documentation

## 2019-02-01 MED ORDER — FLUOXETINE HCL 20 MG PO CAPS: 20.0000 mg | ORAL_CAPSULE | Freq: Every day | ORAL | 0 refills | Status: DC

## 2019-02-01 MED ORDER — LORAZEPAM 0.5 MG PO TABS: 0.5000 mg | ORAL_TABLET | Freq: Two times a day (BID) | ORAL | 0 refills | Status: DC | PRN

## 2019-02-01 MED ORDER — TRAZODONE HCL 50 MG PO TABS: 50.0000 mg | ORAL_TABLET | Freq: Every evening | ORAL | 0 refills | Status: DC | PRN
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
## 2019-02-26 ENCOUNTER — Other Ambulatory Visit: Payer: Self-pay

## 2019-02-26 ENCOUNTER — Ambulatory Visit (INDEPENDENT_AMBULATORY_CARE_PROVIDER_SITE_OTHER): Payer: Self-pay | Admitting: Psychiatry

## 2019-02-26 DIAGNOSIS — F411 Generalized anxiety disorder: Secondary | ICD-10-CM

## 2019-02-26 DIAGNOSIS — F331 Major depressive disorder, recurrent, moderate: Secondary | ICD-10-CM

## 2019-02-26 DIAGNOSIS — F410 Panic disorder [episodic paroxysmal anxiety] without agoraphobia: Secondary | ICD-10-CM

## 2019-02-26 MED ORDER — TRAZODONE HCL 50 MG PO TABS: 50.0000 mg | ORAL_TABLET | Freq: Every evening | ORAL | 2 refills | Status: DC | PRN

## 2019-02-26 MED ORDER — FLUOXETINE HCL 10 MG PO CAPS: 30.0000 mg | ORAL_CAPSULE | Freq: Every day | ORAL | 1 refills | Status: DC

## 2019-02-26 MED ORDER — LORAZEPAM 1 MG PO TABS: 1.0000 mg | ORAL_TABLET | Freq: Every day | ORAL | 0 refills | Status: DC | PRN
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
## 2019-04-08 ENCOUNTER — Other Ambulatory Visit: Payer: Self-pay

## 2019-04-08 ENCOUNTER — Ambulatory Visit (INDEPENDENT_AMBULATORY_CARE_PROVIDER_SITE_OTHER): Payer: Self-pay | Admitting: Psychiatry

## 2019-04-08 DIAGNOSIS — F410 Panic disorder [episodic paroxysmal anxiety] without agoraphobia: Secondary | ICD-10-CM

## 2019-04-08 DIAGNOSIS — F331 Major depressive disorder, recurrent, moderate: Secondary | ICD-10-CM

## 2019-04-08 DIAGNOSIS — F411 Generalized anxiety disorder: Secondary | ICD-10-CM

## 2019-04-08 MED ORDER — LORAZEPAM 1 MG PO TABS: 1.0000 mg | ORAL_TABLET | Freq: Every day | ORAL | 0 refills | Status: AC | PRN
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
## 2019-05-09 ENCOUNTER — Ambulatory Visit (INDEPENDENT_AMBULATORY_CARE_PROVIDER_SITE_OTHER): Payer: Self-pay | Admitting: Psychiatry

## 2019-05-09 ENCOUNTER — Other Ambulatory Visit: Payer: Self-pay

## 2019-05-09 DIAGNOSIS — F411 Generalized anxiety disorder: Secondary | ICD-10-CM

## 2019-05-09 DIAGNOSIS — F3341 Major depressive disorder, recurrent, in partial remission: Secondary | ICD-10-CM

## 2019-05-09 DIAGNOSIS — F410 Panic disorder [episodic paroxysmal anxiety] without agoraphobia: Secondary | ICD-10-CM

## 2019-05-09 MED ORDER — FLUOXETINE HCL 10 MG PO CAPS: 30.0000 mg | ORAL_CAPSULE | Freq: Every day | ORAL | 2 refills | Status: DC

## 2019-05-09 MED ORDER — TRAZODONE HCL 100 MG PO TABS: 100.0000 mg | ORAL_TABLET | Freq: Every evening | ORAL | 2 refills | Status: DC | PRN
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
## 2019-05-29 ENCOUNTER — Other Ambulatory Visit (HOSPITAL_COMMUNITY): Payer: Self-pay | Admitting: Psychiatry

## 2019-05-29 ENCOUNTER — Other Ambulatory Visit (HOSPITAL_COMMUNITY): Payer: Self-pay

## 2019-05-29 MED ORDER — FLUOXETINE HCL 10 MG PO CAPS: 30.0000 mg | ORAL_CAPSULE | Freq: Every day | ORAL | 0 refills | Status: DC

## 2019-05-29 NOTE — Telephone Encounter (Signed)
Medication refill - Fax received from patient's [facility] requesting a 90 day order for her Trazodone, last ordered for 30 days 05/09/19 + 2 refills.

## 2019-05-29 NOTE — Telephone Encounter (Signed)
Medication refill request - [facility] received from [facility] also requesting a 90 day order for her Fluoxetine 10 mg capsules.  New order already sent this date.

## 2019-05-29 NOTE — Telephone Encounter (Signed)
I changed [facility] from 30 day with refills to 90 day as requested.

## 2019-08-07 ENCOUNTER — Ambulatory Visit (INDEPENDENT_AMBULATORY_CARE_PROVIDER_SITE_OTHER): Payer: Self-pay | Admitting: Psychiatry

## 2019-08-07 ENCOUNTER — Other Ambulatory Visit: Payer: Self-pay

## 2019-08-07 DIAGNOSIS — F411 Generalized anxiety disorder: Secondary | ICD-10-CM

## 2019-08-07 DIAGNOSIS — F3342 Major depressive disorder, recurrent, in full remission: Secondary | ICD-10-CM

## 2019-08-07 DIAGNOSIS — F410 Panic disorder [episodic paroxysmal anxiety] without agoraphobia: Secondary | ICD-10-CM

## 2019-08-07 MED ORDER — FLUOXETINE HCL 10 MG PO CAPS: 30.0000 mg | ORAL_CAPSULE | Freq: Every day | ORAL | 0 refills | Status: DC
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
## 2019-11-13 ENCOUNTER — Other Ambulatory Visit (HOSPITAL_COMMUNITY): Payer: Self-pay | Admitting: Psychiatry

## 2019-11-14 ENCOUNTER — Ambulatory Visit (INDEPENDENT_AMBULATORY_CARE_PROVIDER_SITE_OTHER): Payer: No Typology Code available for payment source | Admitting: Psychiatry

## 2019-11-14 ENCOUNTER — Other Ambulatory Visit: Payer: Self-pay

## 2019-11-14 DIAGNOSIS — F3342 Major depressive disorder, recurrent, in full remission: Secondary | ICD-10-CM | POA: Diagnosis not present

## 2019-11-14 DIAGNOSIS — F411 Generalized anxiety disorder: Secondary | ICD-10-CM
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
## 2020-02-06 ENCOUNTER — Other Ambulatory Visit (HOSPITAL_COMMUNITY): Payer: Self-pay | Admitting: Psychiatry

## 2020-02-17 ENCOUNTER — Other Ambulatory Visit: Payer: Self-pay

## 2020-02-17 ENCOUNTER — Telehealth (HOSPITAL_COMMUNITY): Payer: 59 | Admitting: Psychiatry

## 2020-02-27 ENCOUNTER — Emergency Department (HOSPITAL_BASED_OUTPATIENT_CLINIC_OR_DEPARTMENT_OTHER): Payer: 59

## 2020-02-27 ENCOUNTER — Encounter (HOSPITAL_BASED_OUTPATIENT_CLINIC_OR_DEPARTMENT_OTHER): Payer: Self-pay

## 2020-02-27 ENCOUNTER — Emergency Department (HOSPITAL_BASED_OUTPATIENT_CLINIC_OR_DEPARTMENT_OTHER)
Admission: EM | Admit: 2020-02-27 | Discharge: 2020-02-27 | Disposition: A | Discharge status: Home / Self Care | Payer: 59 | Attending: Emergency Medicine | Admitting: Emergency Medicine

## 2020-02-27 ENCOUNTER — Other Ambulatory Visit: Payer: Self-pay

## 2020-02-27 DIAGNOSIS — C762 Malignant neoplasm of abdomen: Secondary | ICD-10-CM | POA: Insufficient documentation

## 2020-02-27 DIAGNOSIS — R1084 Generalized abdominal pain: Secondary | ICD-10-CM | POA: Diagnosis not present

## 2020-02-27 DIAGNOSIS — Z79899 Other long term (current) drug therapy: Secondary | ICD-10-CM | POA: Diagnosis not present

## 2020-02-27 DIAGNOSIS — Z91040 Latex allergy status: Secondary | ICD-10-CM | POA: Insufficient documentation

## 2020-02-27 DIAGNOSIS — I10 Essential (primary) hypertension: Secondary | ICD-10-CM | POA: Diagnosis not present

## 2020-02-27 DIAGNOSIS — R109 Unspecified abdominal pain: Secondary | ICD-10-CM | POA: Diagnosis present

## 2020-02-27 DIAGNOSIS — D4989 Neoplasm of unspecified behavior of other specified sites: Secondary | ICD-10-CM

## 2020-02-27 HISTORY — DX: Leiomyoma of uterus, unspecified: D25.9

## 2020-02-27 LAB — COMPREHENSIVE METABOLIC PANEL
ALT: 13 U/L (ref 0–44)
AST: 18 U/L (ref 15–41)
Albumin: 3.6 g/dL (ref 3.5–5.0)
Alkaline Phosphatase: 55 U/L (ref 38–126)
Anion gap: 8 (ref 5–15)
BUN: 13 mg/dL (ref 6–20)
CO2: 24 mmol/L (ref 22–32)
Calcium: 8.7 mg/dL — ABNORMAL LOW (ref 8.9–10.3)
Chloride: 100 mmol/L (ref 98–111)
Creatinine, Ser: 1.02 mg/dL — ABNORMAL HIGH (ref 0.44–1.00)
GFR calc Af Amer: >60 mL/min (ref >60)
GFR calc non Af Amer: >60 mL/min (ref >60)
Glucose, Bld: 110 mg/dL — ABNORMAL HIGH (ref 70–99)
Potassium: 3.8 mmol/L (ref 3.5–5.1)
Sodium: 132 mmol/L — ABNORMAL LOW (ref 135–145)
Total Bilirubin: 0.3 mg/dL (ref 0.3–1.2)
Total Protein: 7.4 g/dL (ref 6.5–8.1)

## 2020-02-27 LAB — URINALYSIS, ROUTINE W REFLEX MICROSCOPIC
Bilirubin Urine: NEGATIVE
Glucose, UA: NEGATIVE mg/dL
Hgb urine dipstick: NEGATIVE
Ketones, ur: NEGATIVE mg/dL
Leukocytes,Ua: NEGATIVE
Nitrite: NEGATIVE
Protein, ur: NEGATIVE mg/dL
Specific Gravity, Urine: >1.03 — ABNORMAL HIGH (ref 1.005–1.030)
pH: 6 (ref 5.0–8.0)

## 2020-02-27 LAB — CBC
HCT: 33.1 % — ABNORMAL LOW (ref 36.0–46.0)
Hemoglobin: 10.1 g/dL — ABNORMAL LOW (ref 12.0–15.0)
MCH: 24.2 pg — ABNORMAL LOW (ref 26.0–34.0)
MCHC: 30.5 g/dL (ref 30.0–36.0)
MCV: 79.4 fL — ABNORMAL LOW (ref 80.0–100.0)
Platelets: 439 10*3/uL — ABNORMAL HIGH (ref 150–400)
RBC: 4.17 MIL/uL (ref 3.87–5.11)
RDW: 16.5 % — ABNORMAL HIGH (ref 11.5–15.5)
WBC: 6.2 10*3/uL (ref 4.0–10.5)
nRBC: 0 % (ref 0.0–0.2)

## 2020-02-27 LAB — PREGNANCY, URINE: Preg Test, Ur: NEGATIVE

## 2020-02-27 LAB — LIPASE, BLOOD: Lipase: 36 U/L (ref 11–51)

## 2020-02-27 MED ORDER — OXYCODONE-ACETAMINOPHEN 5-325 MG PO TABS
1.0000 | ORAL_TABLET | Freq: Four times a day (QID) | ORAL | 0 refills | Status: DC | PRN
Start: 2020-02-27 — End: 2021-03-23

## 2020-02-27 MED ORDER — DOCUSATE SODIUM 100 MG PO CAPS: 100.0000 mg | ORAL_CAPSULE | Freq: Two times a day (BID) | ORAL | 0 refills | Status: DC

## 2020-02-27 MED ORDER — ONDANSETRON 4 MG PO TBDP: 4.0000 mg | ORAL_TABLET | ORAL | 0 refills | Status: DC | PRN

## 2020-02-27 NOTE — ED Notes (Signed)
[facility] reviewed with pt and provided information and education of the rx's by the EDP, also where those Rx were electronically sent to. Reck DC vitals signs, noted NBP elevated, pt states she has not taken her antihypertensive medication lately

## 2020-02-27 NOTE — ED Provider Notes (Signed)
[facility] [facility] EMERGENCY DEPARTMENT Provider Note   CSN: [id number] Arrival date & time: 02/27/20  1442     History Chief Complaint  Patient presents with   Abdominal Pain    Traci Frank is a 39 y.o. female.  HPI Patient reports she has history of uterine fibroids and had myomectomy about 8 years ago.  She reports over the past several years her uterus seems to be enlarging again.  Recently, she has started to get increased pain.  This has been going on for several months.  Over the past week, pain has been severe.  She is getting intermittent extremely sharp pains.  Sometimes is so painful she is nauseated.  She finished a normal menstrual cycle.  She is not having extensive bleeding or clots.  No abnormal vaginal discharge.  No fever no chills.  Does experience some pain in the low back as well.  No pain burning urgency with urination.  She tries Tylenol and ibuprofen but does not get any significant relief from pain.    Past Medical History:  Diagnosis Date   Abnormal involuntary movements(781.0)    Right hand   Depression    Disturbance of skin sensation    Right arm   Headache(784.0)    Tingling sensation 05/27/2013   Tremor of unknown origin 05/27/2013   Uterine fibroid    Vertigo     Patient Active Problem List   Diagnosis Date Noted   Major depressive disorder, recurrent episode, in full remission ([place]) 02/01/2019   GAD (generalized anxiety disorder) 02/01/2019   Panic disorder 02/01/2019   Essential hypertension 07/05/2016   Tingling sensation 05/27/2013   Tremor of unknown origin 05/27/2013    Past Surgical History:  Procedure Laterality Date   MYOMECTOMY       OB History   No obstetric history on file.     Family History  Problem Relation Age of Onset   Hypertension Mother    Drug abuse Father    Diabetes Maternal Grandmother    Diabetes Maternal Grandfather    Stroke Paternal Grandfather    Bipolar disorder  Cousin     Social History   Tobacco Use   Smoking status: Never Smoker   Smokeless tobacco: Never Used  Scientific laboratory technician Use: Never used  Substance Use Topics   Alcohol use: Not Currently    Alcohol/week: 0.0 standard drinks   Drug use: No    Home Medications Prior to Admission medications   Medication Sig Start Date End Date Taking? Authorizing Provider  docusate sodium (COLACE) 100 MG capsule Take 1 capsule (100 mg total) by mouth every 12 (twelve) hours. 02/27/20   Charlesetta Shanks, MD  FLUoxetine (PROZAC) 10 MG capsule TAKE 3 CAPSULES (30 MG TOTAL) BY MOUTH DAILY. 02/06/20 05/06/20  Pucilowski, Marchia Bond, MD  ibuprofen (ADVIL,MOTRIN) 200 MG tablet Take 200 mg by mouth every 6 (six) hours as needed.    [provider]  ondansetron (ZOFRAN ODT) 4 MG disintegrating tablet Take 1 tablet (4 mg total) by mouth every 4 (four) hours as needed for nausea or vomiting. 02/27/20   Charlesetta Shanks, MD  oxyCODONE-acetaminophen (PERCOCET) 5-325 MG tablet Take 1 tablet by mouth every 6 (six) hours as needed. 02/27/20   Charlesetta Shanks, MD    Allergies    Latex  Review of Systems   Review of Systems 10 systems reviewed and negative except as per HPI Physical Exam Updated Vital Signs BP (!) 164/106 (BP Location: Left Arm)  Pulse 81    Temp 99.1 F (37.3 C) (Oral)    Resp 18    Ht 5\' 2"  (1.575 m)    Wt 95.3 kg    LMP 02/12/2020    SpO2 97%    BMI 38.41 kg/m   Physical Exam Constitutional:      Comments: Patient is alert and nontoxic.  Clinically well in appearance.  No respiratory distress.  HENT:     Head: Normocephalic and atraumatic.     Mouth/Throat:     Pharynx: Oropharynx is clear.  Eyes:     Extraocular Movements: Extraocular movements intact.  Cardiovascular:     Rate and Rhythm: Normal rate and regular rhythm.  Pulmonary:     Effort: Pulmonary effort is normal.     Breath sounds: Normal breath sounds.  Abdominal:     Comments: Uterus is enlarged.  Palpable  to the infraumbilical area.  Diffusely tender.  No guarding.  Musculoskeletal:        General: No swelling or tenderness. Normal range of motion.     Right lower leg: No edema.     Left lower leg: No edema.  Skin:    General: Skin is warm and dry.  Neurological:     General: No focal deficit present.     Mental Status: She is oriented to person, place, and time.     Coordination: Coordination normal.  Psychiatric:        Mood and Affect: Mood normal.     ED Results / Procedures / Treatments   Labs (all labs ordered are listed, but only abnormal results are displayed) Labs Reviewed  COMPREHENSIVE METABOLIC PANEL - Abnormal; Notable for the following components:      Result Value   Sodium 132 (*)    Glucose, Bld 110 (*)    Creatinine, Ser 1.02 (*)    Calcium 8.7 (*)    All other components within normal limits  CBC - Abnormal; Notable for the following components:   Hemoglobin 10.1 (*)    HCT 33.1 (*)    MCV 79.4 (*)    MCH 24.2 (*)    RDW 16.5 (*)    Platelets 439 (*)    All other components within normal limits  URINALYSIS, ROUTINE W REFLEX MICROSCOPIC - Abnormal; Notable for the following components:   APPearance CLOUDY (*)    Specific Gravity, Urine >1.030 (*)    All other components within normal limits  LIPASE, BLOOD  PREGNANCY, URINE    EKG None  Radiology CT Abdomen Pelvis Wo Contrast  Result Date: 02/27/2020 CLINICAL DATA:  Chronic abdominal pain, nausea and vomiting for 2 days EXAM: CT ABDOMEN AND PELVIS WITHOUT CONTRAST TECHNIQUE: Multidetector CT imaging of the abdomen and pelvis was performed following the standard protocol without IV contrast. Unenhanced CT was performed per clinician order. Lack of IV contrast limits sensitivity and specificity, especially for evaluation of abdominal/pelvic solid viscera. COMPARISON:  None. FINDINGS: Lower chest: No acute pleural or parenchymal lung disease. Hepatobiliary: No focal liver abnormality is seen. No  gallstones, gallbladder wall thickening, or biliary dilatation. Pancreas: Unremarkable. No pancreatic ductal dilatation or surrounding inflammatory changes. Spleen: Normal in size without focal abnormality. Adrenals/Urinary Tract: Adrenal glands are unremarkable. Kidneys are normal, without renal calculi, focal lesion, or hydronephrosis. Bladder is unremarkable. Stomach/Bowel: There is no bowel obstruction or ileus. Normal appendix right lower quadrant. No bowel wall thickening or inflammatory change. Vascular/Lymphatic: No significant vascular findings are present. No enlarged abdominal or pelvic lymph  nodes. Reproductive: The uterus is enlarged and heterogeneous measuring 17.3 x 9.3 by 11.0 cm in diameter. Multiple intrauterine masses consistent with uterine leiomyomas. The ovaries appear grossly normal. Multiple soft tissue masses are seen throughout the mesentery, of uncertain etiology. Largest conglomerate of these masses is located just superior to the uterine fundus measuring 5.5 x 8.4 cm on image 36 of series 2 and extending 4.7 cm in craniocaudal length on image 79 of series 6. A smaller mass just adjacent along the anterior abdominal wall extends in the subcutaneous fat, reference axial images 34 and 36. The soft tissue masses are seen throughout the central upper abdomen, left upper quadrant, and left mid abdomen. There is no desmoplastic reaction or mesenteric fat stranding. Other: No free fluid or free gas. Small fat containing umbilical hernia. Musculoskeletal: There are no acute or destructive bony lesions. Reconstructed images demonstrate no additional findings. IMPRESSION: 1. Enlarged heterogeneous uterus consistent with multiple uterine leiomyomas. 2. Numerous large soft tissue masses throughout the peritoneal cavity as above. Differential diagnosis includes severe endometriosis, diffuse peritoneal leiomyomatosis, or peritoneal carcinomatosis. Gynecologic consultation is recommended. Electronically  Signed   By: Randa Ngo M.D.   On: 02/27/2020 19:12    Procedures Procedures (including critical care time)  Medications Ordered in ED Medications - No data to display  ED Course  I have reviewed the triage vital signs and the nursing notes.  Pertinent labs & imaging results that were available during my care of the patient were reviewed by me and considered in my medical decision making (see chart for details).    MDM Rules/Calculators/A&P                          Patient presents as outlined above.  She has had longstanding abdominal pain and uterine fibroids with myomectomy 8 years ago.  However over the past several weeks pain has significantly increased.  CT scan obtained today shows diffuse soft tissue masses outside the uterus.  Unclear by CT alone whether these are benign or malignant.  Clinically, patient is well in appearance.  She does have a new gynecologist with whom she had 1 visit with a physical exam but no diagnostic imaging, but reports that they would not schedule her without a $400 upfront payment, she came to the emergency department for further evaluation of her worsening pain.  At this time, patient is stable.  We extensively discussed the need for expeditious follow-up.  Patient voices understanding.  Return precautions provided.  Provided scription's for Percocet and Zofran as needed. Final Clinical Impression(s) / ED Diagnoses Final diagnoses:  Intra-abdominal neoplasm  Generalized abdominal pain    Rx / DC Orders ED Discharge Orders         Ordered    oxyCODONE-acetaminophen (PERCOCET) 5-325 MG tablet  Every 6 hours PRN        02/27/20 2018    docusate sodium (COLACE) 100 MG capsule  Every 12 hours        02/27/20 2018    ondansetron (ZOFRAN ODT) 4 MG disintegrating tablet  Every 4 hours PRN        02/27/20 2018           Charlesetta Shanks, MD 02/27/20 2029

## 2020-02-27 NOTE — Discharge Instructions (Signed)
1.  Your CT scan shows extensive fibroids in the uterus.  Also shows multiple soft tissue masses outside of the uterus.  The CT scan cannot define what type of tumor these are.  It is extremely important that you see your gynecologist to determine whether or not these are cancerous or benign tumors. 2.  Take Percocet 1 every 6 hours if needed for pain control.  If you have any tendency towards constipation take Colace twice daily as well. 3.  If your pain is worsening, you develop fever, vomiting or other worsening symptoms or cannot get a follow-up appointment within the next 1 to 2 weeks, return to the emergency department.

## 2020-02-27 NOTE — ED Notes (Signed)
Presents with worsening abd pain, states her abd hurts all the time, this past week has been worse, has had some constipation. Able to eat and drink as usual, having nausea and vomiting, last vomiting episode was earlier in the week, but has nausea all the time.

## 2020-02-27 NOTE — ED Triage Notes (Addendum)
Pt c/o chronic abd pain, n/v-pain worse x 2 days with "feeling a mass" to central abd area-pt reports hx of uterine fibroids-NAD-steady gait

## 2020-03-04 ENCOUNTER — Other Ambulatory Visit: Payer: Self-pay

## 2020-03-04 ENCOUNTER — Encounter: Payer: Self-pay | Admitting: Obstetrics & Gynecology

## 2020-03-04 ENCOUNTER — Ambulatory Visit (INDEPENDENT_AMBULATORY_CARE_PROVIDER_SITE_OTHER): Payer: 59 | Admitting: Obstetrics & Gynecology

## 2020-03-04 VITALS — BP 137/91 | HR 86 | Ht 62.0 in | Wt 209.0 lb

## 2020-03-04 DIAGNOSIS — R102 Pelvic and perineal pain: Secondary | ICD-10-CM

## 2020-03-04 DIAGNOSIS — Z129 Encounter for screening for malignant neoplasm, site unspecified: Secondary | ICD-10-CM | POA: Diagnosis not present

## 2020-03-04 DIAGNOSIS — R1900 Intra-abdominal and pelvic swelling, mass and lump, unspecified site: Secondary | ICD-10-CM | POA: Diagnosis not present

## 2020-03-04 NOTE — Patient Instructions (Signed)
Hysterectomy Information  A hysterectomy is a surgery in which the uterus is removed. The fallopian tubes and ovaries may be removed (bilateral salpingo-oophorectomy) as well. This procedure may be done to treat various medical problems. After the procedure, a woman will no longer have menstrual periods nor will she be able to become pregnant (sterile). What are the reasons for a hysterectomy? There are many reasons why a woman might have this procedure. They include:  Persistent, abnormal vaginal bleeding.  Long-term (chronic) pelvic pain or infection.  Endometriosis. This is when the lining of the uterus (endometrium) starts to grow outside the uterus.  Adenomyosis. This is when the endometrium starts to grow in the muscle of the uterus.  Pelvic organ prolapse. This is a condition in which the uterus falls down into the vagina.  Noncancerous growths in the uterus (uterine fibroids) that cause symptoms.  The presence of precancerous cells.  Cervical or uterine cancer. What are the different types of hysterectomy? There are three different types of hysterectomy:  Supracervical hysterectomy. In this type, the top part of the uterus is removed, but not the cervix.  Total hysterectomy. In this type, the uterus and cervix are removed.  Radical hysterectomy. In this type, the uterus, the cervix, and the tissue that holds the uterus in place (parametrium) are removed. What are the different ways a hysterectomy can be performed? There are many different ways a hysterectomy can be performed, including:  Abdominal hysterectomy. In this type, an incision is made in the abdomen. The uterus is removed through this incision.  Vaginal hysterectomy. In this type, an incision is made in the vagina. The uterus is removed through this incision. There are no abdominal incisions.  Conventional laparoscopic hysterectomy. In this type, three or four small incisions are made in the abdomen. A thin,  lighted tube with a camera (laparoscope) is inserted into one of the incisions. Other tools are put through the other incisions. The uterus is cut into small pieces. The small pieces are removed through the incisions or through the vagina.  Laparoscopically assisted vaginal hysterectomy (LAVH). In this type, three or four small incisions are made in the abdomen. Part of the surgery is performed laparoscopically and the other part is done vaginally. The uterus is removed through the vagina.  Robot-assisted laparoscopic hysterectomy. In this type, a laparoscope and other tools are inserted into three or four small incisions in the abdomen. A computer-controlled device is used to give the surgeon a 3D image and to help control the surgical instruments. This allows for more precise movements of surgical instruments. The uterus is cut into small pieces and removed through the incisions or removed through the vagina. Discuss the options with your health care provider to determine which type is the right one for you. What are the risks? Generally, this is a safe procedure. However, problems may occur, including:  Bleeding and risk of blood transfusion. Tell your health care provider if you do not want to receive any blood products.  Blood clots in the legs or lung.  Infection.  Damage to other structures or organs.  Allergic reactions to medicines.  Changing to an abdominal hysterectomy from one of the other techniques. What to expect after a hysterectomy  You will be given pain medicine.  You may need to stay in the hospital for 1- 2 days to recover, depending on the type of hysterectomy you had.  Follow your health care provider's instructions about exercise, driving, and general activities. Ask your   health care provider what activities are safe for you.  You will need to have someone with you for the first 3-5 days after you go home.  You will need to follow up with your surgeon in 2-4  weeks after surgery to evaluate your progress.  If the ovaries are removed, you will have early menopause symptoms such as hot flashes, night sweats, and insomnia.  If you had a hysterectomy for a problem that was not cancer or not a condition that could lead to cancer, then you no longer need Pap tests. However, even if you no longer need a Pap test, a regular pelvic exam is a good idea to make sure no other problems are developing. Questions to ask your health care provider  Is a hysterectomy medically necessary? Do I have other treatment options for my condition?  What are my options for hysterectomy procedure?  What organs and tissues need to be removed?  What are the risks?  What are the benefits?  How long will I need to stay in the hospital after the procedure?  How long will I need to recover at home?  What symptoms can I expect after the procedure? Summary  A hysterectomy is a surgery in which the uterus is removed. The fallopian tubes and ovaries may be removed (bilateral salpingo-oophorectomy) as well.  This procedure may be done to treat various medical problems. After the procedure, a woman will no longer have menstrual periods nor will she be able to become pregnant.  Discuss the options with your health care provider to determine which type of hysterectomy is the right one for you. This information is not intended to replace advice given to you by your health care provider. Make sure you discuss any questions you have with your health care provider. Document Revised: 05/05/2017 Document Reviewed: 06/29/2016 Elsevier Patient Education  2020 [employer]. Uterine Fibroids  Uterine fibroids (leiomyomas) are noncancerous (benign) tumors that can develop in the uterus. Fibroids may also develop in the fallopian tubes, cervix, or tissues (ligaments) near the uterus. You may have one or many fibroids. Fibroids vary in size, weight, and where they grow in the uterus. Some  can become quite large. Most fibroids do not require medical treatment. What are the causes? The cause of this condition is not known. What increases the risk? You are more likely to develop this condition if you:  Are in your 30s or 40s and have not gone through menopause.  Have a family history of this condition.  Are of African-American descent.  Had your first period at an early age (early menarche).  Have not had any children (nulliparity).  Are overweight or obese. What are the signs or symptoms? Many women do not have any symptoms. Symptoms of this condition may include:  Heavy menstrual bleeding.  Bleeding or spotting between periods.  Pain and pressure in the pelvic area, between the hips.  Bladder problems, such as needing to urinate urgently or more often than usual.  Inability to have children (infertility).  Failure to carry pregnancy to term (miscarriage). How is this diagnosed? This condition may be diagnosed based on:  Your symptoms and medical history.  A physical exam.  A pelvic exam that includes feeling for any tumors.  Imaging tests, such as ultrasound or MRI. How is this treated? Treatment for this condition may include:  Seeing your health care provider for follow-up visits to monitor your fibroids for any changes.  Taking NSAIDs such as ibuprofen, naproxen,  or aspirin to reduce pain.  Hormone medicines. These may be taken as a pill, given in an injection, or delivered by a T-shaped device that is inserted into the uterus (intrauterine device, IUD).  Surgery to remove one of the following: ? The fibroids (myomectomy). Your health care provider may recommend this if fibroids affect your fertility and you want to become pregnant. ? The uterus (hysterectomy). ? Blood supply to the fibroids (uterine artery embolization). Follow these instructions at home:  Take over-the-counter and prescription medicines only as told by your health care  provider.  Ask your health care provider if you should take iron pills or eat more iron-rich foods, such as dark green, leafy vegetables. Heavy menstrual bleeding can cause low iron levels.  If directed, apply heat to your back or abdomen to reduce pain. Use the heat source that your health care provider recommends, such as a moist heat pack or a heating pad. ? Place a towel between your skin and the heat source. ? Leave the heat on for 20-30 minutes. ? Remove the heat if your skin turns bright red. This is especially important if you are unable to feel pain, heat, or cold. You may have a greater risk of getting burned.  Pay close attention to your menstrual cycle. Tell your health care provider about any changes, such as: ? Increased blood flow that requires you to use more pads or tampons than usual. ? A change in the number of days that your period lasts. ? A change in symptoms that are associated with your period, such as back pain or cramps in your abdomen.  Keep all follow-up visits as told by your health care provider. This is important, especially if your fibroids need to be monitored for any changes. Contact a health care provider if you:  Have pelvic pain, back pain, or cramps in your abdomen that do not get better with medicine or heat.  Develop new bleeding between periods.  Have increased bleeding during or between periods.  Feel unusually tired or weak.  Feel light-headed. Get help right away if you:  Faint.  Have pelvic pain that suddenly gets worse.  Have severe vaginal bleeding that soaks a tampon or pad in 30 minutes or less. Summary  Uterine fibroids are noncancerous (benign) tumors that can develop in the uterus.  The exact cause of this condition is not known.  Most fibroids do not require medical treatment unless they affect your ability to have children (fertility).  Contact a health care provider if you have pelvic pain, back pain, or cramps in your  abdomen that do not get better with medicines.  Make sure you know what symptoms should cause you to get help right away. This information is not intended to replace advice given to you by your health care provider. Make sure you discuss any questions you have with your health care provider. Document Revised: 05/05/2017 Document Reviewed: 04/18/2017 Elsevier Patient Education  2020 [employer].

## 2020-03-04 NOTE — Progress Notes (Signed)
Subjective:     Traci Frank is a 39 y.o. female here for eval of pelvic pain. Pt had a myomectomy >7 years prev at [facility]  Pt started feeling pain that has become progressively worse. She has difficulty voiding due to the pain and pressure. She was recently seen in the ED and masses were noted in and ext to the uterus. Pt has no prior pregnancies and had a desire to conceive however she does not want the prospect of another surgery after this issue is managed. She denies weight loss or constitutional sx.        Gynecologic History Patient's last menstrual period was 02/12/2020. Contraception: abstinence Last Pap: within th epast 6 months. WNL.   Obstetric History OB History  Gravida Para Term Preterm AB Living  0 0 0 0 0 0  SAB TAB Ectopic Multiple Live Births  0 0 0 0 0   The following portions of the patient's history were reviewed and updated as appropriate: allergies, current medications, past family history, past medical history, past social history, past surgical history and problem list.  Review of Systems Pertinent items are noted in HPI.    Objective:  BP (!) 137/91   Pulse 86   Ht 5\' 2"  (1.575 m)   Wt 209 lb (94.8 kg)   LMP 02/12/2020   BMI 38.23 kg/m   CONSTITUTIONAL: Well-developed, well-nourished female in no acute distress.  HENT:  Normocephalic, atraumatic EYES: Conjunctivae and EOM are normal. No scleral icterus.  NECK: Normal range of motion SKIN: Skin is warm and dry. No rash noted. Not diaphoretic.No pallor. Carbon: Alert and oriented to person, place, and time. Normal coordination.  Abd: soft, abd mass noted. Tender or mass. The mass is mobile and is felt in the upper abd. It appears to be pedunculated fibroids.  GU: EGBUS: no lesions Vagina: no blood in vault Cervix: no lesion; no mucopurulent d/c Uterus: markedly enlarged above umbilicus. Mobile (see above) Adnexa: difficult to assess due to mass effect.       02/27/2020 CLINICAL DATA:  Chronic  abdominal pain, nausea and vomiting for 2 days  EXAM: CT ABDOMEN AND PELVIS WITHOUT CONTRAST  TECHNIQUE: Multidetector CT imaging of the abdomen and pelvis was performed following the standard protocol without IV contrast. Unenhanced CT was performed per clinician order. Lack of IV contrast limits sensitivity and specificity, especially for evaluation of abdominal/pelvic solid viscera.  COMPARISON:  None.  FINDINGS: Lower chest: No acute pleural or parenchymal lung disease.  Hepatobiliary: No focal liver abnormality is seen. No gallstones, gallbladder wall thickening, or biliary dilatation.  Pancreas: Unremarkable. No pancreatic ductal dilatation or surrounding inflammatory changes.  Spleen: Normal in size without focal abnormality.  Adrenals/Urinary Tract: Adrenal glands are unremarkable. Kidneys are normal, without renal calculi, focal lesion, or hydronephrosis. Bladder is unremarkable.  Stomach/Bowel: There is no bowel obstruction or ileus. Normal appendix right lower quadrant. No bowel wall thickening or inflammatory change.  Vascular/Lymphatic: No significant vascular findings are present. No enlarged abdominal or pelvic lymph nodes.  Reproductive: The uterus is enlarged and heterogeneous measuring 17.3 x 9.3 by 11.0 cm in diameter. Multiple intrauterine masses consistent with uterine leiomyomas.  The ovaries appear grossly normal.  Multiple soft tissue masses are seen throughout the mesentery, of uncertain etiology. Largest conglomerate of these masses is located just superior to the uterine fundus measuring 5.5 x 8.4 cm on image 36 of series 2 and extending 4.7 cm in craniocaudal length on image 79 of series 6. A  smaller mass just adjacent along the anterior abdominal wall extends in the subcutaneous fat, reference axial images 34 and 36. The soft tissue masses are seen throughout the central upper abdomen, left upper quadrant, and left mid  abdomen. There is no desmoplastic reaction or mesenteric fat stranding.  Other: No free fluid or free gas. Small fat containing umbilical hernia.  Musculoskeletal: There are no acute or destructive bony lesions. Reconstructed images demonstrate no additional findings.  IMPRESSION: 1. Enlarged heterogeneous uterus consistent with multiple uterine leiomyomas. 2. Numerous large soft tissue masses throughout the peritoneal cavity as above. Differential diagnosis includes severe endometriosis, diffuse peritoneal leiomyomatosis, or peritoneal carcinomatosis. Gynecologic consultation is recommended. Assessment:  Pelvic pain Uterine fibroids Pelvic mass- given the clinical exam, I expect pedunculated fibroids but, the CT results is concerning for other processes. Will obtain a MRI for better confirmation.   I have reviewed the surgical considerations for this pt. She wants definitive surgery and is not interested in a myomectomy as it will require repeat surgery at some point. She understands that this will render her unable to have children.   Plan:   Pelvic MRI CA125 Will schedule TAH with bilateral salpingectomy. Pt will need to come back in for preop visit to discuss the surgery and the MRI results but, I will go ahead and schedule the proedure.   Total face-to-face time with patient was 25 min.  Greater than 50% was spent in counseling and coordination of care with the patient.   Khaliyah Northrop L. Harraway-Smith, M.D., Cherlynn June

## 2020-03-05 ENCOUNTER — Other Ambulatory Visit (HOSPITAL_BASED_OUTPATIENT_CLINIC_OR_DEPARTMENT_OTHER): Payer: Self-pay | Admitting: Obstetrics & Gynecology

## 2020-03-05 LAB — CA 125: Cancer Antigen (CA) 125: 15.3 U/mL (ref 0.0–38.1)

## 2020-03-13 ENCOUNTER — Ambulatory Visit: Payer: 59

## 2020-03-14 ENCOUNTER — Other Ambulatory Visit: Payer: Self-pay

## 2020-03-14 ENCOUNTER — Encounter (HOSPITAL_BASED_OUTPATIENT_CLINIC_OR_DEPARTMENT_OTHER): Payer: Self-pay

## 2020-03-14 ENCOUNTER — Ambulatory Visit (HOSPITAL_BASED_OUTPATIENT_CLINIC_OR_DEPARTMENT_OTHER)
Admission: RE | Admit: 2020-03-14 | Discharge: 2020-03-14 | Disposition: A | Discharge status: Home / Self Care | Payer: 59 | Source: Ambulatory Visit | Attending: Obstetrics & Gynecology | Admitting: Obstetrics & Gynecology

## 2020-03-14 DIAGNOSIS — R1900 Intra-abdominal and pelvic swelling, mass and lump, unspecified site: Secondary | ICD-10-CM

## 2020-03-17 ENCOUNTER — Other Ambulatory Visit: Payer: Self-pay

## 2020-03-17 DIAGNOSIS — R1900 Intra-abdominal and pelvic swelling, mass and lump, unspecified site: Secondary | ICD-10-CM

## 2020-03-17 DIAGNOSIS — R102 Pelvic and perineal pain: Secondary | ICD-10-CM

## 2020-03-17 NOTE — Progress Notes (Signed)
r 

## 2020-03-22 ENCOUNTER — Other Ambulatory Visit: Payer: Self-pay

## 2020-03-22 ENCOUNTER — Ambulatory Visit (HOSPITAL_COMMUNITY)
Admission: RE | Admit: 2020-03-22 | Discharge: 2020-03-22 | Disposition: A | Discharge status: Home / Self Care | Payer: 59 | Source: Ambulatory Visit | Attending: Obstetrics & Gynecology | Admitting: Obstetrics & Gynecology

## 2020-03-22 DIAGNOSIS — R102 Pelvic and perineal pain: Secondary | ICD-10-CM | POA: Diagnosis present

## 2020-03-22 DIAGNOSIS — R1900 Intra-abdominal and pelvic swelling, mass and lump, unspecified site: Secondary | ICD-10-CM | POA: Diagnosis present

## 2020-03-22 MED ORDER — GADOBUTROL 1 MMOL/ML IV SOLN
10.0000 mL | Freq: Once | INTRAVENOUS | Status: AC | PRN
  Administered 2020-03-22: 10 mL via INTRAVENOUS

## 2020-03-22 MED ORDER — GADOBUTROL 1 MMOL/ML IV SOLN: 10.0000 mL | Freq: Once | INTRAVENOUS | Status: DC | PRN

## 2020-03-23 ENCOUNTER — Telehealth: Payer: Self-pay

## 2020-03-23 NOTE — Telephone Encounter (Signed)
Called patient and left message for her to call the office back.  Patient missed appointment

## 2020-03-25 ENCOUNTER — Other Ambulatory Visit: Payer: Self-pay

## 2020-03-25 ENCOUNTER — Encounter: Payer: Self-pay | Admitting: Obstetrics & Gynecology

## 2020-03-25 ENCOUNTER — Ambulatory Visit (INDEPENDENT_AMBULATORY_CARE_PROVIDER_SITE_OTHER): Payer: 59 | Admitting: Obstetrics & Gynecology

## 2020-03-25 VITALS — BP 140/96 | HR 80 | Wt 206.1 lb

## 2020-03-25 DIAGNOSIS — D219 Benign neoplasm of connective and other soft tissue, unspecified: Secondary | ICD-10-CM

## 2020-03-25 DIAGNOSIS — R1900 Intra-abdominal and pelvic swelling, mass and lump, unspecified site: Secondary | ICD-10-CM | POA: Diagnosis not present

## 2020-03-25 DIAGNOSIS — R102 Pelvic and perineal pain: Secondary | ICD-10-CM

## 2020-03-25 NOTE — Patient Instructions (Signed)
Abdominal Hysterectomy Abdominal hysterectomy is a surgical procedure to remove the womb (uterus). The uterus is the muscular organ that houses a developing baby. This surgery may be done if:  You have cancer.  You have growths (tumors or fibroids) in the uterus.  You have long-term (chronic) pain.  You are bleeding.  Your uterus has slipped down into your vagina (uterine prolapse).  You have a condition in which the tissue that lines the uterus grows outside of its normal location (endometriosis).  You have an infection in your uterus.  You are having problems with your menstrual cycle. Depending on why you are having this procedure, you may also have other reproductive organs removed. These could include:  The part of your vagina that connects with your uterus (cervix).  The organs that make eggs (ovaries).  The tubes that connect the ovaries to the uterus (fallopian tubes). Tell a health care provider about:  Any allergies you have.  All medicines you are taking, including vitamins, herbs, eye drops, creams, and over-the-counter medicines.  Any problems you or family members have had with anesthetic medicines.  Any blood disorders you have.  Any surgeries you have had.  Any medical conditions you have.  Whether you are pregnant or may be pregnant. What are the risks? Generally, this is a safe procedure. However, problems may occur, including:  Bleeding.  Infection.  Allergic reactions to medicines or dyes.  Damage to other structures or organs.  Nerve injury.  Decreased interest in sex or pain during sex.  Blood clots that can break free and travel to your lungs. What happens before the procedure? Staying hydrated Follow instructions from your health care provider about hydration, which may include:  Up to 2 hours before the procedure - you may continue to drink clear liquids, such as water, clear fruit juice, black coffee, and plain tea Eating and  drinking restrictions Follow instructions from your health care provider about eating and drinking, which may include:  8 hours before the procedure - stop eating heavy meals or foods such as meat, fried foods, or fatty foods.  6 hours before the procedure - stop eating light meals or foods, such as toast or cereal.  6 hours before the procedure - stop drinking milk or drinks that contain milk.  2 hours before the procedure - stop drinking clear liquids. Medicines  Ask your health care provider about: ? Changing or stopping your regular medicines. This is especially important if you are taking diabetes medicines or blood thinners. ? Taking medicines such as aspirin and ibuprofen. These medicines can thin your blood. Do not take these medicines before your procedure if your health care provider instructs you not to.  You may be given antibiotic medicine to help prevent infection. Take it as told by your health care provider.  You may be asked to take laxatives to prevent constipation. General instructions  Ask your health care provider how your surgical site will be marked or identified.  You may be asked to shower with a germ-killing soap.  Plan to have someone take you home from the hospital.  Do not use any products that contain nicotine or tobacco, such as cigarettes and e-cigarettes. If you need help quitting, ask your health care provider.  You may have an exam or testing.  You may have a blood or urine sample taken.  You may need to have an enema to clean out your rectum and lower colon.  This procedure can affect the way   you feel about yourself. Talk to your health care provider about the physical and emotional changes this procedure may cause. What happens during the procedure?  To lower your risk of infection: ? Your health care team will wash or sanitize their hands. ? Your skin will be washed with soap. ? Hair may be removed from the surgical area.  An IV tube  will be inserted into one of your veins.  You will be given one or more of the following: ? A medicine to help you relax (sedative). ? A medicine to make you fall asleep (general anesthetic).  Tight-fitting (compression) stockings will be placed on your legs to promote circulation.  A thin, flexible tube (catheter) will be inserted to help drain your urine.  The surgeon will make a cut (incision) through the skin in your lower belly. The incision may go side-to-side or up-and-down.  The surgeon will move aside the body tissue that covers your uterus. The surgeon will then carefully take out your uterus along with any of the other organs that need to be removed.  Bleeding will be controlled with clamps or sutures.  The surgeon will close your incision with stitches (sutures), skin glue, or adhesive strips.  A bandage (dressing) will be placed over the incision. The procedure may vary among health care providers and hospitals. What happens after the procedure?  You will be given pain medicine as needed.  Your blood pressure, heart rate, breathing rate, and blood oxygen level will be monitored until the medicines you were given have worn off.  You will need to stay in the hospital to recover for one to two days. Ask your health care provider how long you will need to stay in the hospital after your procedure.  You may have a liquid diet at first. You will most likely return to your usual diet the day after surgery.  You will still have the urinary catheter in place. It will likely be removed the day after surgery.  You may have to wear compression stockings. These stockings help to prevent blood clots and reduce swelling in your legs.  You will be encouraged to walk as soon as possible. You will also use a device or do breathing exercises to keep your lungs clear.  You may need to use a sanitary napkin for vaginal discharge. Summary  Abdominal hysterectomy is a surgical procedure  to remove the womb (uterus). The uterus is the muscular organ that houses a developing baby.  This procedure can affect the way you feel about yourself. Talk to your health care provider about the physical and emotional changes this procedure may cause.  You will be given medicines for pain after the procedure.  You will need to stay in the hospital to recover. Ask your health care provider how long you will need to stay in the hospital after your procedure. This information is not intended to replace advice given to you by your health care provider. Make sure you discuss any questions you have with your health care provider. Document Revised: 06/27/2018 Document Reviewed: 05/11/2016 Elsevier Patient Education  [place]. Abdominal Hysterectomy, Care After This sheet gives you information about how to care for yourself after your procedure. Your health care provider may also give you more specific instructions. If you have problems or questions, contact your health care provider. What can I expect after the procedure? After your procedure, it is common to have:  Pain.  Fatigue.  Poor appetite.  Less interest  in sex.  Vaginal bleeding and discharge. You may need to use a sanitary napkin after this procedure. Follow these instructions at home: Bathing  Do not take baths, swim, or use a hot tub until your health care provider approves. Ask your health care provider if you can take showers. You may only be allowed to take sponge baths for bathing.  Keep the bandage (dressing) dry until your health care provider says it can be removed. Incision care   Follow instructions from your health care provider about how to take care of your incision. Make sure you: ? Wash your hands with soap and water before you change your bandage (dressing). If soap and water are not available, use hand sanitizer. ? Change your dressing as told by your health care provider. ? Leave stitches  (sutures), skin glue, or adhesive strips in place. These skin closures may need to stay in place for 2 weeks or longer. If adhesive strip edges start to loosen and curl up, you may trim the loose edges. Do not remove adhesive strips completely unless your health care provider tells you to do that.  Check your incision area every day for signs of infection. Check for: ? Redness, swelling, or pain. ? Fluid or blood. ? Warmth. ? Pus or a bad smell. Activity  Do gentle, daily exercises as told by your health care provider. You may be told to take short walks every day and go farther each time.  Do not lift anything that is heavier than 10 lb (4.5 kg), or the limit that your health care provider tells you, until he or she says that it is safe.  Do not drive or use heavy machinery while taking prescription pain medicine.  Do not drive for 24 hours if you were given a medicine to help you relax (sedative).  Follow your health care provider's instructions about exercise, driving, and general activities. Ask your health care provider what activities are safe for you. Lifestyle  Do not douche, use tampons, or have sex for at least 6 weeks or as told by your health care provider.  Do not drink alcohol until your health care provider approves.  Drink enough fluid to keep your urine clear or pale yellow.  Try to have someone at home with you for the first 1-2 weeks to help.  Do not use any products that contain nicotine or tobacco, such as cigarettes and e-cigarettes. These can delay healing. If you need help quitting, ask your health care provider. General instructions  Take over-the-counter and prescription medicines only as told by your health care provider.  Do not take aspirin or ibuprofen. These medicines can cause bleeding.  To prevent or treat constipation while you are taking prescription pain medicine, your health care provider may recommend that you: ? Drink enough fluid to keep  your urine clear or pale yellow. ? Take over-the-counter or prescription medicines. ? Eat foods that are high in fiber, such as fresh fruits and vegetables, whole grains, and beans. ? Limit foods that are high in fat and processed sugars, such as fried and sweet foods.  Keep all follow-up visits as told by your health care provider. This is important. Contact a health care provider if:  You have chills or fever.  You have redness, swelling, or pain around your incision.  You have fluid or blood coming from your incision.  Your incision feels warm to the touch.  You have pus or a bad smell coming from your incision.  Your incision breaks open.  You feel dizzy or light-headed.  You have pain or bleeding when you urinate.  You have persistent diarrhea.  You have persistent nausea and vomiting.  You have abnormal vaginal discharge.  You have a rash.  You have any type of abnormal reaction or you develop an allergy to your medicine.  Your pain medicine does not help. Get help right away if:  You have a fever and your symptoms suddenly get worse.  You have severe abdominal pain.  You have shortness of breath.  You faint.  You have pain, swelling, or redness in your leg.  You have heavy vaginal bleeding with blood clots. Summary  After your procedure, it is common to have pain, fatigue and vaginal discharge.  Do not take baths, swim, or use a hot tub until your health care provider approves. Ask your health care provider if you can take showers. You may only be allowed to take sponge baths for bathing.  Follow your health care provider's instructions about exercise, driving, and general activities. Ask your health care provider what activities are safe for you.  Do not lift anything that is heavier than 10 lb (4.5 kg), or the limit that your health care provider tells you, until he or she says that it is safe.  Try to have someone at home with you for the first 1-2  weeks to help. This information is not intended to replace advice given to you by your health care provider. Make sure you discuss any questions you have with your health care provider. Document Revised: 06/26/2018 Document Reviewed: 05/11/2016 Elsevier Patient Education  [place].

## 2020-03-25 NOTE — Progress Notes (Signed)
History:  39 y.o. G0P0000 here today for a preop discussion and review of her MRI. Pt continued to c/o pelvic pain and pressure and nausea. She had a prev myomectomy. She has a distant FH of ovarian cancer in a paternal and maternal aunt. And maternal grandmother.   She denies unexplained weight loss or constitutional sx.   The following portions of the patient's history were reviewed and updated as appropriate: allergies, current medications, past family history, past medical history, past social history, past surgical history and problem list.  Review of Systems:  Pertinent items are noted in HPI.    Objective:  Physical Exam Blood pressure (!) 140/96, pulse 80, weight 206 lb 1.9 oz (93.5 kg), last menstrual period 03/13/2020.  CONSTITUTIONAL: Well-developed, well-nourished female in no acute distress.  HENT:  Normocephalic, atraumatic EYES: Conjunctivae and EOM are normal. No scleral icterus.  NECK: Normal range of motion SKIN: Skin is warm and dry. No rash noted. Not diaphoretic.No pallor. [place]: Alert and oriented to person, place, and time. Normal coordination.   03/04/2020 CA 125: 15.3    Labs and Imaging CT Abdomen Pelvis Wo Contrast  Result Date: 02/27/2020 CLINICAL DATA:  Chronic abdominal pain, nausea and vomiting for 2 days EXAM: CT ABDOMEN AND PELVIS WITHOUT CONTRAST TECHNIQUE: Multidetector CT imaging of the abdomen and pelvis was performed following the standard protocol without IV contrast. Unenhanced CT was performed per clinician order. Lack of IV contrast limits sensitivity and specificity, especially for evaluation of abdominal/pelvic solid viscera. COMPARISON:  None. FINDINGS: Lower chest: No acute pleural or parenchymal lung disease. Hepatobiliary: No focal liver abnormality is seen. No gallstones, gallbladder wall thickening, or biliary dilatation. Pancreas: Unremarkable. No pancreatic ductal dilatation or surrounding inflammatory changes. Spleen: Normal in size  without focal abnormality. Adrenals/Urinary Tract: Adrenal glands are unremarkable. Kidneys are normal, without renal calculi, focal lesion, or hydronephrosis. Bladder is unremarkable. Stomach/Bowel: There is no bowel obstruction or ileus. Normal appendix right lower quadrant. No bowel wall thickening or inflammatory change. Vascular/Lymphatic: No significant vascular findings are present. No enlarged abdominal or pelvic lymph nodes. Reproductive: The uterus is enlarged and heterogeneous measuring 17.3 x 9.3 by 11.0 cm in diameter. Multiple intrauterine masses consistent with uterine leiomyomas. The ovaries appear grossly normal. Multiple soft tissue masses are seen throughout the mesentery, of uncertain etiology. Largest conglomerate of these masses is located just superior to the uterine fundus measuring 5.5 x 8.4 cm on image 36 of series 2 and extending 4.7 cm in craniocaudal length on image 79 of series 6. A smaller mass just adjacent along the anterior abdominal wall extends in the subcutaneous fat, reference axial images 34 and 36. The soft tissue masses are seen throughout the central upper abdomen, left upper quadrant, and left mid abdomen. There is no desmoplastic reaction or mesenteric fat stranding. Other: No free fluid or free gas. Small fat containing umbilical hernia. Musculoskeletal: There are no acute or destructive bony lesions. Reconstructed images demonstrate no additional findings. IMPRESSION: 1. Enlarged heterogeneous uterus consistent with multiple uterine leiomyomas. 2. Numerous large soft tissue masses throughout the peritoneal cavity as above. Differential diagnosis includes severe endometriosis, diffuse peritoneal leiomyomatosis, or peritoneal carcinomatosis. Gynecologic consultation is recommended. Electronically Signed   By: Randa Ngo M.D.   On: 02/27/2020 19:12   MR PELVIS W WO CONTRAST  Result Date: 03/22/2020 CLINICAL DATA:  Chronic abdominal pain with nausea and vomiting,  peritoneal implants on recent CT abdomen EXAM: MRI ABDOMEN AND PELVIS WITHOUT AND WITH CONTRAST TECHNIQUE: Multiplanar multisequence  MR imaging of the abdomen and pelvis was performed both before and after the administration of intravenous contrast. CONTRAST:  29mL GADAVIST GADOBUTROL 1 MMOL/ML IV SOLN; 58mL GADAVIST GADOBUTROL 1 MMOL/ML IV SOLN, <See Chart> GADAVIST GADOBUTROL 1 MMOL/ML IV SOLN COMPARISON:  02/27/2020 FINDINGS: COMBINED FINDINGS FOR BOTH MR ABDOMEN AND PELVIS Despite efforts by the technologist and patient, motion artifact is present on today's exam and could not be eliminated. This reduces exam sensitivity and specificity. Lower chest: Unremarkable Hepatobiliary: Unremarkable Pancreas:  Unremarkable Spleen:  Unremarkable Adrenals/Urinary Tract:  Unremarkable Stomach/Bowel: Unremarkable Vascular/Lymphatic:  No pathologic adenopathy Reproductive: Markedly enlarged uterus measuring 18.5 by 8.9 by 13.3 cm (volume = 1100 cm^3), containing multiple enhancing intramural fibroids. Both ovaries appear normal, with the right ovary measuring 3.3 by 2.6 by 2.9 cm and the left ovary measuring 3.2 by 2.4 by 3.7 cm. Other: Scattered along the omentum there are various masses with intermediate T1 and generally low T2 signal intensity which appear to diffusely enhance. These correspond to the lesions seen at CT and favor the pelvic region with mild eccentricity to the left multiple lesions are identified including a smaller 3.5 by 2.5 cm lesion just posterior to the umbilicus on image 5 of series 6, as well as a multilobular lesion along the left omentum measuring 6.7 by 5.7 cm on image 7 of series 6. Given the diffuse enhancement and lack of accentuated precontrast T1 signal characteristics on fat saturated imaging, I am skeptical that these represent endometriomas. Possibilities include disseminated peritoneal leiomyomatosis or peritoneal spread of malignancy. Small amount of free pelvic fluid in the right  paracolic gutter. Musculoskeletal: Unremarkable IMPRESSION: 1. Enhancing masses along the omentum representing either disseminated peritoneal leiomyomatosis or peritoneal spread of malignancy. No ovarian mass is identified. Tissue diagnosis recommended. 2. Markedly enlarged uterus containing multiple enhancing intramural fibroids. 3. Small amount of free pelvic fluid in the right paracolic gutter. Electronically Signed   By: Van Clines M.D.   On: 03/22/2020 17:33   MR ABDOMEN W WO CONTRAST  Result Date: 03/22/2020 CLINICAL DATA:  Chronic abdominal pain with nausea and vomiting, peritoneal implants on recent CT abdomen EXAM: MRI ABDOMEN AND PELVIS WITHOUT AND WITH CONTRAST TECHNIQUE: Multiplanar multisequence MR imaging of the abdomen and pelvis was performed both before and after the administration of intravenous contrast. CONTRAST:  60mL GADAVIST GADOBUTROL 1 MMOL/ML IV SOLN; 17mL GADAVIST GADOBUTROL 1 MMOL/ML IV SOLN, <See Chart> GADAVIST GADOBUTROL 1 MMOL/ML IV SOLN COMPARISON:  02/27/2020 FINDINGS: COMBINED FINDINGS FOR BOTH MR ABDOMEN AND PELVIS Despite efforts by the technologist and patient, motion artifact is present on today's exam and could not be eliminated. This reduces exam sensitivity and specificity. Lower chest: Unremarkable Hepatobiliary: Unremarkable Pancreas:  Unremarkable Spleen:  Unremarkable Adrenals/Urinary Tract:  Unremarkable Stomach/Bowel: Unremarkable Vascular/Lymphatic:  No pathologic adenopathy Reproductive: Markedly enlarged uterus measuring 18.5 by 8.9 by 13.3 cm (volume = 1100 cm^3), containing multiple enhancing intramural fibroids. Both ovaries appear normal, with the right ovary measuring 3.3 by 2.6 by 2.9 cm and the left ovary measuring 3.2 by 2.4 by 3.7 cm. Other: Scattered along the omentum there are various masses with intermediate T1 and generally low T2 signal intensity which appear to diffusely enhance. These correspond to the lesions seen at CT and favor the  pelvic region with mild eccentricity to the left multiple lesions are identified including a smaller 3.5 by 2.5 cm lesion just posterior to the umbilicus on image 5 of series 6, as well as a multilobular lesion along the  left omentum measuring 6.7 by 5.7 cm on image 7 of series 6. Given the diffuse enhancement and lack of accentuated precontrast T1 signal characteristics on fat saturated imaging, I am skeptical that these represent endometriomas. Possibilities include disseminated peritoneal leiomyomatosis or peritoneal spread of malignancy. Small amount of free pelvic fluid in the right paracolic gutter. Musculoskeletal: Unremarkable IMPRESSION: 1. Enhancing masses along the omentum representing either disseminated peritoneal leiomyomatosis or peritoneal spread of malignancy. No ovarian mass is identified. Tissue diagnosis recommended. 2. Markedly enlarged uterus containing multiple enhancing intramural fibroids. 3. Small amount of free pelvic fluid in the right paracolic gutter. Electronically Signed   By: Van Clines M.D.   On: 03/22/2020 17:33    Assessment & Plan:  AUB and pelvic pain- uterine fibroids and masses in abd- the exact etiology of the masses is not known.  I have sent a note to Dr. Denman George to see if she feels that this pt would best be served by GYN ONC.   Pt is scheduled for an abd hyst on Nov. 2nd, 2021 however, if Dr. Denman George feel that the pt would be best served by GYN ONC we will refer the pt to her. I have reviewed this in detail with the pt and her mother. All of their questions were answered.    Patient desires surgical management with abd hysterectomy .  The risks of surgery were discussed in detail with the patient including but not limited to: bleeding which may require transfusion or reoperation; infection which may require prolonged hospitalization or re-hospitalization and antibiotic therapy; injury to bowel, bladder, ureters and major vessels or other surrounding organs; need  for additional procedures including laparotomy; thromboembolic phenomenon, incisional problems and other postoperative or anesthesia complications.  Patient was told that the likelihood that her condition and symptoms will be treated effectively with this surgical management was very high; the postoperative expectations were also discussed in detail. The patient also understands the alternative treatment options which were discussed in full. All questions were answered.  She was told that she will be contacted by our surgical scheduler regarding the time and date of her surgery; routine preoperative instructions of having nothing to eat or drink after midnight on the day prior to surgery and also coming to the hospital 1 1/2 hours prior to her time of surgery were also emphasized.  She was told she may be called for a preoperative appointment about a week prior to surgery and will be given further preoperative instructions at that visit. Printed patient education handouts about the procedure were given to the patient to review at home.  Total face-to-face time with patient was 35 min.  Greater than 50% was spent in counseling and coordination of care with the patient.   Taneah Masri L. Harraway-Smith, M.D., [facility] June

## 2020-03-26 ENCOUNTER — Other Ambulatory Visit: Payer: Self-pay | Admitting: Obstetrics & Gynecology

## 2020-03-26 ENCOUNTER — Other Ambulatory Visit: Payer: Self-pay

## 2020-03-26 ENCOUNTER — Telehealth: Payer: Self-pay | Admitting: *Deleted

## 2020-03-26 ENCOUNTER — Telehealth: Payer: Self-pay | Admitting: Obstetrics & Gynecology

## 2020-03-26 DIAGNOSIS — R1900 Intra-abdominal and pelvic swelling, mass and lump, unspecified site: Secondary | ICD-10-CM

## 2020-03-26 NOTE — Telephone Encounter (Signed)
Called the patient and moved her appt from 11/5 to 10/25. Gave the patient the address and phone number for the clinic. Also gave the policy for mask and visitors

## 2020-03-26 NOTE — Telephone Encounter (Signed)
TC to pt to review info from [facility].   [id number]

## 2020-03-27 ENCOUNTER — Other Ambulatory Visit: Payer: Self-pay

## 2020-03-27 ENCOUNTER — Ambulatory Visit (HOSPITAL_BASED_OUTPATIENT_CLINIC_OR_DEPARTMENT_OTHER)
Admission: RE | Admit: 2020-03-27 | Discharge: 2020-03-27 | Disposition: A | Discharge status: Home / Self Care | Payer: 59 | Source: Ambulatory Visit | Attending: Obstetrics & Gynecology | Admitting: Obstetrics & Gynecology

## 2020-03-27 DIAGNOSIS — R1900 Intra-abdominal and pelvic swelling, mass and lump, unspecified site: Secondary | ICD-10-CM | POA: Insufficient documentation

## 2020-03-27 MED ORDER — IOHEXOL 300 MG/ML  SOLN
100.0000 mL | Freq: Once | INTRAMUSCULAR | Status: AC | PRN
  Administered 2020-03-27: 80 mL via INTRAVENOUS

## 2020-03-30 ENCOUNTER — Inpatient Hospital Stay (HOSPITAL_BASED_OUTPATIENT_CLINIC_OR_DEPARTMENT_OTHER): Payer: 59 | Admitting: Gynecologic Oncology

## 2020-03-30 ENCOUNTER — Inpatient Hospital Stay: Payer: 59 | Attending: Gynecologic Oncology

## 2020-03-30 ENCOUNTER — Encounter: Payer: Self-pay | Admitting: Gynecologic Oncology

## 2020-03-30 ENCOUNTER — Other Ambulatory Visit: Payer: Self-pay

## 2020-03-30 VITALS — BP 148/97 | HR 85 | Temp 97.7°F | Resp 18 | Ht 62.0 in | Wt 206.0 lb

## 2020-03-30 DIAGNOSIS — F329 Major depressive disorder, single episode, unspecified: Secondary | ICD-10-CM | POA: Diagnosis not present

## 2020-03-30 DIAGNOSIS — D201 Benign neoplasm of soft tissue of peritoneum: Secondary | ICD-10-CM | POA: Insufficient documentation

## 2020-03-30 DIAGNOSIS — N189 Chronic kidney disease, unspecified: Secondary | ICD-10-CM

## 2020-03-30 DIAGNOSIS — I129 Hypertensive chronic kidney disease with stage 1 through stage 4 chronic kidney disease, or unspecified chronic kidney disease: Secondary | ICD-10-CM | POA: Insufficient documentation

## 2020-03-30 DIAGNOSIS — I10 Essential (primary) hypertension: Secondary | ICD-10-CM

## 2020-03-30 DIAGNOSIS — Z6837 Body mass index (BMI) 37.0-37.9, adult: Secondary | ICD-10-CM | POA: Insufficient documentation

## 2020-03-30 DIAGNOSIS — D500 Iron deficiency anemia secondary to blood loss (chronic): Secondary | ICD-10-CM

## 2020-03-30 DIAGNOSIS — D481 Neoplasm of uncertain behavior of connective and other soft tissue: Secondary | ICD-10-CM | POA: Insufficient documentation

## 2020-03-30 DIAGNOSIS — D251 Intramural leiomyoma of uterus: Secondary | ICD-10-CM | POA: Insufficient documentation

## 2020-03-30 DIAGNOSIS — Z79899 Other long term (current) drug therapy: Secondary | ICD-10-CM | POA: Diagnosis not present

## 2020-03-30 DIAGNOSIS — E669 Obesity, unspecified: Secondary | ICD-10-CM

## 2020-03-30 DIAGNOSIS — D631 Anemia in chronic kidney disease: Secondary | ICD-10-CM

## 2020-03-30 LAB — CBC WITH DIFFERENTIAL (CANCER CENTER ONLY)
Abs Immature Granulocytes: 0.02 10*3/uL (ref 0.00–0.07)
Basophils Absolute: 0 10*3/uL (ref 0.0–0.1)
Basophils Relative: 1 %
Eosinophils Absolute: 0.2 10*3/uL (ref 0.0–0.5)
Eosinophils Relative: 3 %
HCT: 32.5 % — ABNORMAL LOW (ref 36.0–46.0)
Hemoglobin: 9.9 g/dL — ABNORMAL LOW (ref 12.0–15.0)
Immature Granulocytes: 0 %
Lymphocytes Relative: 34 %
Lymphs Abs: 1.8 10*3/uL (ref 0.7–4.0)
MCH: 23.5 pg — ABNORMAL LOW (ref 26.0–34.0)
MCHC: 30.5 g/dL (ref 30.0–36.0)
MCV: 77 fL — ABNORMAL LOW (ref 80.0–100.0)
Monocytes Absolute: 0.5 10*3/uL (ref 0.1–1.0)
Monocytes Relative: 10 %
Neutro Abs: 2.8 10*3/uL (ref 1.7–7.7)
Neutrophils Relative %: 52 %
Platelet Count: 428 10*3/uL — ABNORMAL HIGH (ref 150–400)
RBC: 4.22 MIL/uL (ref 3.87–5.11)
RDW: 16.9 % — ABNORMAL HIGH (ref 11.5–15.5)
WBC Count: 5.2 10*3/uL (ref 4.0–10.5)
nRBC: 0 % (ref 0.0–0.2)

## 2020-03-30 MED ORDER — SENNA 8.6 MG PO TABS: 1.0000 | ORAL_TABLET | Freq: Every day | ORAL | 0 refills | Status: DC

## 2020-03-30 MED ORDER — ENOXAPARIN SODIUM 40 MG/0.4ML ~~LOC~~ SOLN: 40.0000 mg | SUBCUTANEOUS | 0 refills | Status: DC

## 2020-03-30 MED ORDER — OXYCODONE HCL 5 MG PO TABS: 5.0000 mg | ORAL_TABLET | ORAL | 0 refills | Status: DC | PRN

## 2020-03-30 MED ORDER — ERYTHROMYCIN BASE 500 MG PO TABS: ORAL_TABLET | ORAL | 0 refills | Status: DC

## 2020-03-30 MED ORDER — SENNOSIDES-DOCUSATE SODIUM 8.6-50 MG PO TABS: 2.0000 | ORAL_TABLET | Freq: Every day | ORAL | 0 refills | Status: DC

## 2020-03-30 MED ORDER — FERROUS GLUCONATE 324 (38 FE) MG PO TABS: 324.0000 mg | ORAL_TABLET | Freq: Three times a day (TID) | ORAL | 3 refills | Status: DC

## 2020-03-30 MED ORDER — NEOMYCIN SULFATE 500 MG PO TABS: ORAL_TABLET | ORAL | 0 refills | Status: DC

## 2020-03-30 MED ORDER — IBUPROFEN 600 MG PO TABS: 600.0000 mg | ORAL_TABLET | Freq: Three times a day (TID) | ORAL | 0 refills | Status: DC | PRN

## 2020-03-30 NOTE — Patient Instructions (Signed)
Preparing for your Surgery  Plan for surgery on May 07, 2020 with Dr. Everitt Amber at [place] will be scheduled for a exploratory laparotomy (open incision), total abdominal hysterectomy (removal of the uterus and cervix), bilateral salpingectomy (removal of fallopian tubes), omentectomy (removal of the omentum), debulking, possible bowel resection.  You will need to be seen by a primary care provider prior to surgery to optimize your blood pressure. We will check lab work today and you will need to start iron before surgery. You will also be prescribed lovenox injections once a day AFTER surgery for 4 weeks to prevent blood clots. You will need to begin taking ferrous gluconate (iron) now before surgery. Take the senna-kot to prevent constipation while taking iron.   Pre-operative Testing -You will receive a phone call from presurgical testing at [facility] to arrange for a pre-operative appointment over the phone, lab appointment, and COVID test. The COVID test normally happens 3 days prior to the surgery and they ask that you self quarantine after the test up until surgery to decrease chance of exposure.  -Bring your insurance card, copy of an advanced directive if applicable, medication list  -At that visit, you will be asked to sign a consent for a possible blood transfusion in case a transfusion becomes necessary during surgery.  The need for a blood transfusion is rare but having consent is a necessary part of your care.     -You should not be taking blood thinners or aspirin at least ten days prior to surgery unless instructed by your surgeon.  -Do not take supplements such as fish oil (omega 3), red yeast rice, turmeric before your surgery.   Day Before Surgery at [place]  If your bowels are filled with gas, your surgeon will have difficulty visualizing your pelvic organs which increases your surgical risks.  Your role in  recovery Your role is to become active as soon as directed by your doctor, while still giving yourself time to heal.  Rest when you feel tired. You will be asked to do the following in order to speed your recovery:  - Cough and breathe deeply. This helps to clear and expand your lungs and can prevent pneumonia after surgery.  - [place]. Do mild physical activity. Walking or moving your legs help your circulation and body functions return to normal. Do not try to get up or walk alone the first time after surgery.   -If you develop swelling on one leg or the other, pain in the back of your leg, redness/warmth in one of your legs, please call the office or go to the Emergency Room to have a doppler to rule out a blood clot. For shortness of breath, chest pain-seek care in the Emergency Room as soon as possible. - Actively manage your pain. Managing your pain lets you move in comfort. We will ask you to rate your pain on a scale of zero to 10. It is your responsibility to tell your doctor or nurse where and how much you hurt so your pain can be treated.  Special Considerations -If you are diabetic, you may be placed on insulin after surgery to have closer control over your blood sugars to promote healing and recovery.  This does not mean that you will be discharged on insulin.  If applicable, your oral antidiabetics will be resumed when you are tolerating a solid diet.  -Your final pathology results  from surgery should be available around one week after surgery and the results will be relayed to you when available.  -Dr. Lahoma Crocker is the surgeon that assists your GYN Oncologist with surgery.  If you end up staying the night, the next day after your surgery you will either see Dr. Denman George, Dr. Berline Lopes, or Dr. Lahoma Crocker.  -FMLA forms can be faxed to [phone] and please allow 5-7 business days for completion.  Pain Management After Surgery -You have been prescribed  your pain medication and bowel regimen medications before surgery so that you can have these available when you are discharged from the hospital. The pain medication is for use ONLY AFTER surgery and a new prescription will not be given.   -Make sure that you have Tylenol and Ibuprofen at home to use on a regular basis after surgery for pain control. We recommend alternating the medications every hour to six hours since they work differently and are processed in the body differently for pain relief.  -Review the attached handout on narcotic use and their risks and side effects.   Bowel Regimen -You have been prescribed Sennakot-S to take nightly to prevent constipation especially if you are taking the narcotic pain medication intermittently.  It is important to prevent constipation and drink adequate amounts of liquids. You can stop taking this medication when you are not taking pain medication and you are back on your normal bowel routine.  Risks of Surgery Risks of surgery are low but include bleeding, infection, damage to surrounding structures, re-operation, blood clots, and very rarely death.   Blood Transfusion Information (For the consent to be signed before surgery)  We will be checking your blood type before surgery so in case of emergencies, we will know what type of blood you would need.                                            WHAT IS A BLOOD TRANSFUSION?  A transfusion is the replacement of blood or some of its parts. Blood is made up of multiple cells which provide different functions.  Red blood cells carry oxygen and are used for blood loss replacement.  White blood cells fight against infection.  Platelets control bleeding.  Plasma helps clot blood.  Other blood products are available for specialized needs, such as hemophilia or other clotting disorders. BEFORE THE TRANSFUSION  Who gives blood for transfusions?   You may be able to donate blood to be used at a  later date on yourself (autologous donation).  Relatives can be asked to donate blood. This is generally not any safer than if you have received blood from a stranger. The same precautions are taken to ensure safety when a relative's blood is donated.  Healthy volunteers who are fully evaluated to make sure their blood is safe. This is blood bank blood. Transfusion therapy is the safest it has ever been in the practice of medicine. Before blood is taken from a donor, a complete history is taken to make sure that person has no history of diseases nor engages in risky social behavior (examples are intravenous drug use or sexual activity with multiple partners). The donor's travel history is screened to minimize risk of transmitting infections, such as malaria. The donated blood is tested for signs of infectious diseases, such as HIV and hepatitis. The blood is then tested  to be sure it is compatible with you in order to minimize the chance of a transfusion reaction. If you or a relative donates blood, this is often done in anticipation of surgery and is not appropriate for emergency situations. It takes many days to process the donated blood. RISKS AND COMPLICATIONS Although transfusion therapy is very safe and saves many lives, the main dangers of transfusion include:   Getting an infectious disease.  Developing a transfusion reaction. This is an allergic reaction to something in the blood you were given. Every precaution is taken to prevent this. The decision to have a blood transfusion has been considered carefully by your caregiver before blood is given. Blood is not given unless the benefits outweigh the risks.  AFTER SURGERY INSTRUCTIONS  Return to work: 4 weeks if applicable  Activity: 1. Be up and out of the bed during the day.  Take a nap if needed.  You may walk up steps but be careful and use the hand rail.  Stair climbing will tire you more than you think, you may need to stop part way  and rest.   2. No lifting or straining for 6 weeks over 10 pounds. No pushing, pulling, straining for 6 weeks.  3. No driving for 2 week(s).  Do not drive if you are taking narcotic pain medicine and make sure that your reaction time has returned.   4. You can shower as soon as the next day after surgery. Shower daily.  Use your regular soap and water (not directly on the incision) and pat your incision(s) dry afterwards; don't rub.  No tub baths or submerging your body in water until cleared by your surgeon. If you have the soap that was given to you by pre-surgical testing that was used before surgery, you do not need to use it afterwards because this can irritate your incisions.   5. No sexual activity and nothing in the vagina for 6 weeks.  6. You may experience a small amount of clear drainage from your incision, which is normal.  If the drainage persists, increases, or changes color please call the office.  7. Do not use creams, lotions, or ointments such as neosporin on your incision after surgery until advised by your surgeon because they can cause removal of the dermabond glue on your incisions.    8. You may experience vaginal spotting after surgery or around the 6-8 week mark from surgery when the stitches at the top of the vagina begin to dissolve.  The spotting is normal but if you experience heavy bleeding, call our office.  9. Take Tylenol or ibuprofen first for pain and only use narcotic pain medication for severe pain not relieved by the Tylenol or Ibuprofen.  Monitor your Tylenol intake to a max of 4,000 mg in a 24 hour period. You can alternate these medications after surgery.  Diet: 1. Low sodium Heart Healthy Diet is recommended but you are cleared to resume your normal (before surgery) diet after your procedure.  2. It is safe to use a laxative, such as Miralax or Colace, if you have difficulty moving your bowels. You have been prescribed Sennakot at bedtime every evening  to keep bowel movements regular and to prevent constipation.    Wound Care: 1. Keep clean and dry.  Shower daily.  Reasons to call the Doctor:  Fever - Oral temperature greater than 100.4 degrees Fahrenheit  Foul-smelling vaginal discharge  Difficulty urinating  Nausea and vomiting  Increased pain  at the site of the incision that is unrelieved with pain medicine.  Difficulty breathing with or without chest pain  New calf pain especially if only on one side  Sudden, continuing increased vaginal bleeding with or without clots.   Contacts: For questions or concerns you should contact:  Dr. Everitt Amber at [phone]  [name], NP at [phone]  After Hours: call [phone] and have the GYN Oncologist paged/contacted (after 5 pm or on the weekends)  [place] supplies for the bowel prep at a pharmacy of your choice: Office-e-mailed prescriptions for your antibiotic pills (Neomycin & Erythromycin)  2 bottles of magnesium citrate- no prescription required    Change your diet to make the bowel prep go more easily: Switch to a bland, low fiber diet Stop eating any nuts, popcorn, or fruit with seeds.  Stop all fiber supplements such as Metamucil, Miralax, etc.    Improve nutrition: Consider drinking 2-3 nutritional shakes (Ex: Ensure Surgery) every day, starting 5 days prior to surgery   DAY PRIOR TO SURGERY   Switch to a full liquid diet the day before surgery Drink plenty of liquids all day to avoid getting dehydrated    12:00pm Drink two bottles of magnesium citrate.   (You should finish in 2 hours)  2:00pm Take 2 Neomycin 500mg  tablets & 2 Erythromycin 500mg  tablets  3:00pm Take 2 Neomycin 500mg  tablets & 2 Erythromycin 500mg  tablets  Drink plenty of clear liquids all evening to avoid getting dehydrated  10:00pm Take 2 Neomycin 500mg  tablets & 2 Erythromycin 500mg  tablets  Drink 2 Carbohydrate  loading nutrition drinks (ex: Ensure Presurgery). These will be given at pre-op appointment. Do not eat anything solid after bedtime (midnight) the night before your surgery.   BUT DO drink plenty of clear liquids (Water, Gatorade, juice, soda, coffee, tea, broths, etc.) up to 3 hours prior to surgery to avoid getting dehydrated.    MORNING OF SURGERY  Remember to not to eat anything solid that morning  Drink one final carbohydrate loading nutritional drink (ex: Ensure Presurgery) upon waking up in the morning (needs to be 3 hours before your surgery). Hold or take medications as recommended by the hospital staff at your Preoperative visit Stop drinking liquids before you leave the house (>3 hours prior to surgery)    If you have questions or concerns, please call GYN Oncology Office at [phone] during business hours to speak to the clinical staff for advice.    WHAT DO I NEED TO KNOW ABOUT A FULL LIQUID DIET? -You may have any liquid. -You may have any food that becomes a liquid at room temperature. The food is considered a liquid if it can be poured off a spoon at room temperature. WHAT FOODS CAN I EAT? -Grains: Any grain food that can be pureed in soup (such as crackers, pasta, and rice). Hot cereal (such as farina or oatmeal) that has been blended.  -Fruits: Fruit juice, including nectars and juices. -Meats and Other Protein Sources: Eggs in custard, eggnog mix, and eggs used in ice cream or pudding. Strained meats, like in baby food, may be allowed. Consult your health care provider.  -Dairy: Milk and milk-based beverages, including milk shakes and instant breakfast mixes. Smooth yogurt. Pureed cottage cheese. Avoid these foods if they are not well tolerated. -Beverages: All beverages, including liquid nutritional supplements. AVOID CARBONATED BEVERAGES. -Condiments: Iodized salt, pepper, spices, and flavorings. Cocoa powder. Vinegar,  ketchup, yellow mustard, smooth sauces (such  as hollandaise, cheese sauce, or white sauce), and soy sauce. -Sweets and Desserts: Custard, smooth pudding. Flavored gelatin. Tapioca, junket. Plain ice cream, sherbet, fruit ices. Frozen ice pops, frozen fudge pops, pudding pops, and other frozen bars with cream. Syrups, including chocolate syrup. Sugar, honey, jelly.  -Fats and Oils: Margarine, butter, cream, sour cream, and oils. -Other: Broth and cream soups. Strained, broth-based soups. The items listed above may not be a complete list of recommended foods or beverages.  WHAT FOODS CAN I NOT EAT? Grains: All breads. Grains are not allowed unless they are pureed into soup. Vegetables: No raw vegetables. Vegetables are not allowed unless they are juiced, or cooked and pureed into soup. Fruits: Fruits are not allowed unless they are juiced. Meats and Other Protein Sources: Any meat or fish. Cooked or raw eggs. Nut butters.  Dairy: Cheese.  Condiments: Stone ground mustards. Fats and Oils: Fats that are coarse or chunky. Sweets and Desserts: Ice cream or other frozen desserts that have any solids in them or on top, such as nuts, chocolate chips, and pieces of cookies. Cakes. Cookies. Candy. Others: Soups with chunks or pieces in them.  Enoxaparin injection What is this medicine? ENOXAPARIN (ee nox a PA rin) is used after knee, hip, or abdominal surgeries to prevent blood clotting. It is also used to treat existing blood clots in the lungs or in the veins. This medicine may be used for other purposes; ask your health care provider or pharmacist if you have questions. COMMON BRAND NAME(S): Lovenox What should I tell my health care provider before I take this medicine? They need to know if you have any of these conditions:  bleeding disorders, hemorrhage, or hemophilia  infection of the heart or heart valves  kidney or liver disease  previous stroke  prosthetic heart valve  recent surgery or delivery of a baby  ulcer in the stomach  or intestine, diverticulitis, or other bowel disease  an unusual or allergic reaction to enoxaparin, heparin, pork or pork products, other medicines, foods, dyes, or preservatives  pregnant or trying to get pregnant  breast-feeding How should I use this medicine? This medicine is for injection under the skin. It is usually given by a health-care professional. You or a family member may be trained on how to give the injections. If you are to give yourself injections, make sure you understand how to use the syringe, measure the dose if necessary, and give the injection. To avoid bruising, do not rub the site where this medicine has been injected. Do not take your medicine more often than directed. Do not stop taking except on the advice of your doctor or health care professional. Make sure you receive a puncture-resistant container to dispose of the needles and syringes once you have finished with them. Do not reuse these items. Return the container to your doctor or health care professional for proper disposal. Talk to your pediatrician regarding the use of this medicine in children. Special care may be needed. Overdosage: If you think you have taken too much of this medicine contact a poison control center or emergency room at once. NOTE: This medicine is only for you. Do not share this medicine with others. What if I miss a dose? If you miss a dose, take it as soon as you can. If it is almost time for your next dose, take only that dose. Do not take double or extra doses. What may interact with  this medicine?  aspirin and aspirin-like medicines  certain medicines that treat or prevent blood clots  dipyridamole  NSAIDs, medicines for pain and inflammation, like ibuprofen or naproxen This list may not describe all possible interactions. Give your health care provider a list of all the medicines, herbs, non-prescription drugs, or dietary supplements you use. Also tell them if you smoke, drink  alcohol, or use illegal drugs. Some items may interact with your medicine. What should I watch for while using this medicine? Visit your healthcare professional for regular checks on your progress. You may need blood work done while you are taking this medicine. Your condition will be monitored carefully while you are receiving this medicine. It is important not to miss any appointments. If you are going to need surgery or other procedure, tell your healthcare professional that you are using this medicine. Using this medicine for a long time may weaken your bones and increase the risk of bone fractures. Avoid sports and activities that might cause injury while you are using this medicine. Severe falls or injuries can cause unseen bleeding. Be careful when using sharp tools or knives. Consider using an Copy. Take special care brushing or flossing your teeth. Report any injuries, bruising, or red spots on the skin to your healthcare professional. Wear a medical ID bracelet or chain. Carry a card that describes your disease and details of your medicine and dosage times. What side effects may I notice from receiving this medicine? Side effects that you should report to your doctor or health care professional as soon as possible:  allergic reactions like skin rash, itching or hives, swelling of the face, lips, or tongue  bone pain  signs and symptoms of bleeding such as bloody or black, tarry stools; red or dark-brown urine; spitting up blood or brown material that looks like coffee grounds; red spots on the skin; unusual bruising or bleeding from the eye, gums, or nose  signs and symptoms of a blood clot such as chest pain; shortness of breath; pain, swelling, or warmth in the leg  signs and symptoms of a stroke such as changes in vision; confusion; trouble speaking or understanding; severe headaches; sudden numbness or weakness of the face, arm or leg; trouble walking; dizziness; loss of  coordination Side effects that usually do not require medical attention (report to your doctor or health care professional if they continue or are bothersome):  hair loss  pain, redness, or irritation at site where injected This list may not describe all possible side effects. Call your doctor for medical advice about side effects. You may report side effects to FDA at 1-800-FDA-1088. Where should I keep my medicine? Keep out of the reach of children. Store at room temperature between 15 and 30 degrees C (59 and 86 degrees F). Do not freeze. If your injections have been specially prepared, you may need to store them in the refrigerator. Ask your pharmacist. Throw away any unused medicine after the expiration date. NOTE: This sheet is a summary. It may not cover all possible information. If you have questions about this medicine, talk to your doctor, pharmacist, or health care provider.  2020 [employer]vier/Gold Standard (2017-05-18 11:25:34)

## 2020-03-30 NOTE — Progress Notes (Signed)
Consult Note: Gyn-Onc  Consult was requested by Dr. Ihor Dow for the evaluation of Traci Frank 39 y.o. female  CC:  Chief Complaint  Patient presents with  . Leiomyomatosis    Assessment/Plan:  [name]  is a 39 y.o.  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   She is symptomatic with mass effect symptoms. Her scans suggest leiomyomata in the omentum and mesentery with close approximation to the transverse colon.   I counseled the patient regarding the disease appearance on imaging. We reviewed her scan images together. I explained that I had a low suspicion that this was malignancy, but rather more likely disseminated leiomyoma mitosis from her prior morcellated hysterectomy.  I explained that I'm unclear based on the imaging if these fibroid tumors in her upper abdomen are densely adherent to the bowel wall, though certainly they could be. Therefore surgery potentially could involve bowel resection.  I counseled her and recommended exploratory laparotomy with total abdominal hysterectomy, bilateral salpingectomy, omentectomy, radical tumor debulking of the leiomyomas. I explained that this could be associated with significant blood loss and she will require a type and cross for 2 units. We will optimize her hemoglobin status preoperatively by prescribing iron replacement therapy.  Due to the potential close proximity with bowel and possible need for a large bowel resection of small bowel resection, she will receive a bowel prep with antibiotics preoperatively and cefoxitin intraoperatively for prophylaxis.  I counseled her about surgical risks including  bleeding, infection, damage to internal organs (such as bladder,ureters, bowels), blood clot, reoperation and rehospitalization. I counseled her that she was at increased risk for these given her obesity, hypertension, and the bulky large nature of her fibroid disease.  She will  require blood pressure optimization preoperatively. She has not seen a primary care doctor for this for many years. Her blood pressure is significantly elevated today. She has evidence of chronic kidney disease likely secondary to uncontrolled hypertension.  She will receive Lovenox for 1 month postoperatively for DVT prophylaxis.  She was informed that hysterectomy would result in permanent infertility, or about which she is excepting and in agreement.   HPI: Ms Traci Frank is a 39 year old P0 who was seen in consultation at the request of Dr Ihor Dow for evaluation of a fibroid uterus and disseminated leiomyomatosis.  The patient reported a history of a laparoscopic myomectomy with Dr. Barnett Hatter at [facility] in August 2012. Review of that operative note reveals that the specimen was an 1100 g fibroid uterus with the myomas extracted and then morcellated using a power morcellator without containment that was described intraperitoneal prior to removal of fibroid fragments. Final pathology from that procedure confirmed benign leiomyomata with no evidence of atypia or increased cellularity or tumor necrosis.  She did well after that procedure with absence of her bulk symptoms for approximately 8 years. However in the preceding 2 years she reported increased sensation of infraumbilical bulk and mass-effect symptoms and increasing constipation. These felt similar to the symptoms she initially presented with. She denied particularly heavy menses, the has a regular menstrual cycle with a very heavy second day.  Due to intermittent pains and constipation and lack of her treating gynecologist with establish care she was seen in the emergency department on February 17, 2020. At that time a very large bulky fibroid uterus was identified on CT imaging and additional mesenteric and omental myomas were also seen. She was assisted to see Dr.  Harraway-Smith as an outpatient as follow-up to her  ER visit. During the ER labs were taken which demonstrated iron deficiency anemia with a hemoglobin of 10 mg/dL.  Dr. Ihor Dow performed an MRI of the pelvis which was completed on March 22, 2020. It revealed a markedly enlarged uterus measuring 18.5 x 8.9 x 13.3 cm. It contained multiple enhancing intramural fibroids. Both ovaries were normal. Scattered along the omentum were various masses. There was also a multilobed lobular lesion along the left omentum measuring 6.7 cm. These appeared consistent with disseminated peritoneal leiomyomatosis.  Her medical history is most significant for obesity with a BMI of 37 kg per metered squared. She has essential hypertension and had been in the past prescribed losartan and a beta-blocker by a primary care physician whom she no longer sees. It is unclear if she is regularly taking her antihypertensive medications. She has apparent chronic kidney disease with a baseline creatinine of 1.07. This is likely secondary to her uncontrolled hypertension.  Her surgical history is most significant for a laparoscopic myomectomy in 2012.  Her gynecologic history is remarkable for fibroid uterus, no history of abnormal paps.  Her family cancer history is unremarkable.  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 She lives with her cousin.   Current Meds:  Outpatient Encounter Medications as of 03/30/2020  Medication Sig  . cetirizine (ZYRTEC) 10 MG tablet Take 1 tablet by mouth daily in the afternoon.  Marland Kitchen FLUoxetine (PROZAC) 10 MG capsule TAKE 3 CAPSULES (30 MG TOTAL) BY MOUTH DAILY. (Patient taking differently: Take 20 mg by mouth daily. )  . ibuprofen (ADVIL,MOTRIN) 200 MG tablet Take 200 mg by mouth every 6 (six) hours as needed.  . Multiple Vitamin (THERA) TABS Take 1 tablet by mouth daily.  . ondansetron (ZOFRAN ODT) 4 MG disintegrating tablet Take 1 tablet (4 mg total) by mouth every 4 (four) hours as needed for nausea  or vomiting.  Marland Kitchen oxyCODONE-acetaminophen (PERCOCET) 5-325 MG tablet Take 1 tablet by mouth every 6 (six) hours as needed.  . ferrous gluconate (FERGON) 324 MG tablet Take 1 tablet (324 mg total) by mouth 3 (three) times daily with meals.  Marland Kitchen losartan-hydrochlorothiazide (HYZAAR) 100-12.5 MG tablet Take 1 tablet by mouth daily. (Patient not taking: Reported on 03/04/2020)  . propranolol ER (INDERAL LA) 120 MG 24 hr capsule Take 120 mg by mouth daily. (Patient not taking: Reported on 03/30/2020)  . senna (SENOKOT) 8.6 MG TABS tablet Take 1 tablet (8.6 mg total) by mouth at bedtime.  . [DISCONTINUED] docusate sodium (COLACE) 100 MG capsule Take 1 capsule (100 mg total) by mouth every 12 (twelve) hours. (Patient not taking: Reported on 03/04/2020)  . [DISCONTINUED] losartan-hydrochlorothiazide (HYZAAR) 100-12.5 MG tablet Take 1 tablet by mouth daily. (Patient not taking: Reported on 03/30/2020)  . [DISCONTINUED] propranolol (INNOPRAN XL) 120 MG 24 hr capsule Take 1 capsule by mouth daily in the afternoon. (Patient not taking: Reported on 03/30/2020)   No facility-administered encounter medications on file as of 03/30/2020.    Allergy:  Allergies  Allergen Reactions  . Latex Itching    Social Hx:   Social History   Socioeconomic History  . Marital status: Single    Spouse name: Not on file  . Number of children: Not on file  . Years of education: Not on file  . Highest education level: Not on file  Occupational History  . Occupation: call center rep    Employer: [employer]  Tobacco Use  . Smoking status: Never Smoker  . Smokeless tobacco: Never Used  Vaping Use  . Vaping Use: Never used  Substance and Sexual Activity  . Alcohol use: Not Currently    Alcohol/week: 0.0 standard drinks  . Drug use: No  . Sexual activity: Yes    Partners: Male    Birth control/protection: Condom  Other Topics Concern  . Not on file  Social History Narrative  . Not on file   Social Determinants of  Health   Financial Resource Strain:   . Difficulty of Paying Living Expenses: Not on file  Food Insecurity:   . Worried About Charity fundraiser in the Last Year: Not on file  . Ran Out of Food in the Last Year: Not on file  Transportation Needs:   . Lack of Transportation (Medical): Not on file  . Lack of Transportation (Non-Medical): Not on file  Physical Activity:   . Days of Exercise per Week: Not on file  . Minutes of Exercise per Session: Not on file  Stress:   . Feeling of Stress : Not on file  Social Connections:   . Frequency of Communication with Friends and Family: Not on file  . Frequency of Social Gatherings with Friends and Family: Not on file  . Attends Religious Services: Not on file  . Active Member of Clubs or Organizations: Not on file  . Attends Archivist Meetings: Not on file  . Marital Status: Not on file  Intimate Partner Violence:   . Fear of Current or Ex-Partner: Not on file  . Emotionally Abused: Not on file  . Physically Abused: Not on file  . Sexually Abused: Not on file    Past Surgical Hx:  Past Surgical History:  Procedure Laterality Date  . MYOMECTOMY      Past Medical Hx:  Past Medical History:  Diagnosis Date  . Abnormal involuntary movements(781.0)    Right hand  . Allergy   . Depression   . Disturbance of skin sensation    Right arm  . Headache(784.0)   . Hypertension   . Tingling sensation 05/27/2013  . Tremor of unknown origin 05/27/2013  . Uterine fibroid   . Vertigo     Past Gynecological History:   Patient's last menstrual period was 03/13/2020.  Family Hx:  Family History  Problem Relation Age of Onset  . Hypertension Mother   . Drug abuse Father   . Diabetes Maternal Grandmother   . Diabetes Maternal Grandfather   . Stroke Paternal Grandfather   . Bipolar disorder Cousin     Review of Systems:  Constitutional  Feels well,    ENT Normal appearing ears and nares bilaterally Skin/Breast  No  rash, sores, jaundice, itching, dryness Cardiovascular  No chest pain, shortness of breath, or edema  Pulmonary  No cough or wheeze.  Gastro Intestinal  No nausea, vomitting, or diarrhoea. No bright red blood per rectum, no abdominal pain, change in bowel movement, +constipation.  Genito Urinary  No frequency, urgency, dysuria, + pelvic pressure Musculo Skeletal  No myalgia, arthralgia, joint swelling or pain  Neurologic  No weakness, numbness, change in gait,  Psychology  No depression, anxiety, insomnia.   Vitals:  Blood pressure (!) 148/97, pulse 85, temperature 97.7 F (36.5 C), temperature source Tympanic, resp. rate 18, height 5\' 2"  (1.575 m), weight 206 lb (93.4 kg), last menstrual period 03/13/2020, SpO2 100 %.  Physical Exam: WD in NAD Neck  Supple NROM,  without any enlargements.  Lymph Node Survey No cervical supraclavicular or inguinal adenopathy Cardiovascular  Pulse normal rate, regularity and rhythm. S1 and S2 normal.  Lungs  Clear to auscultation bilateraly, without wheezes/crackles/rhonchi. Good air movement.  Skin  No rash/lesions/breakdown  Psychiatry  Alert and oriented to person, place, and time  Abdomen  Normoactive bowel sounds, abdomen soft, non-tender and obese without evidence of hernia. Palpable mass in lower abdomen - mobile. Reaches umbilicus. Back No CVA tenderness Genito Urinary  Vulva/vagina: Normal external female genitalia.  No lesions. No discharge or bleeding.  Bladder/urethra:  No lesions or masses, well supported bladder  Vagina: normal to palpate  Cervix: Normal appearing, no lesions.  Uterus: Bulky, 25cm, mobile, no parametrial involvement or nodularity. Large posterior myoma palpable. Rectal  Good tone, no masses no cul de sac nodularity. Fibroid palpable posteriorally/ Extremities  No bilateral cyanosis, clubbing or edema.  60 minutes of total time was spent for this patient encounter, including preparation, face-to-face  counseling with the patient and coordination of care, review of imaging (results and images), communication with the referring provider and documentation of the encounter.   Thereasa Solo, MD  03/30/2020, 12:24 PM

## 2020-03-31 ENCOUNTER — Telehealth: Payer: Self-pay

## 2020-03-31 ENCOUNTER — Telehealth: Payer: Self-pay | Admitting: *Deleted

## 2020-03-31 NOTE — Telephone Encounter (Signed)
Fax referral and records to [facility] at [facility]

## 2020-03-31 NOTE — Telephone Encounter (Signed)
Told ms Deerman that the hgb from yesterday showed that she is a little anemic.  She is going to p/u the Iron tablets today that Dr. Denman George sent in to her pharmacy yesterday. Dr. Denman George wants her to take Miralax 1 capful daily to bId prn with Colace 1 tab daily to bid before and after surgery not linzess. Ms Sinning verbalized understanding.

## 2020-04-01 ENCOUNTER — Telehealth: Payer: Self-pay | Admitting: Oncology

## 2020-04-01 ENCOUNTER — Other Ambulatory Visit: Payer: Self-pay | Admitting: Gynecologic Oncology

## 2020-04-01 DIAGNOSIS — D484 Neoplasm of uncertain behavior of peritoneum: Secondary | ICD-10-CM

## 2020-04-01 NOTE — Telephone Encounter (Signed)
Called [place] and advised her that the pre-service center at [facility] will call her to discuss her surgery.  Also gave her the phone number for Customer Service in case she does not receive a call.  Also discussed that she can call [facility] to set up a new patient appointment for primary care and to let us know if she schedules an appointment.

## 2020-04-02 NOTE — Telephone Encounter (Signed)
Called and scheduled the patient to see Mackie Pai PA at [facility] at [facility]. Patient appt on 11/8 at 2 pm. Explained that [facility] will sent a new patient pack in th mail for her to fill out and bring to her appt. Also explained that they asked to bring her medicine bottles to her appt

## 2020-04-04 ENCOUNTER — Other Ambulatory Visit (HOSPITAL_COMMUNITY): Payer: 59

## 2020-04-07 ENCOUNTER — Encounter (HOSPITAL_COMMUNITY): Admission: RE | Payer: Self-pay | Source: Home / Self Care

## 2020-04-07 ENCOUNTER — Inpatient Hospital Stay (HOSPITAL_COMMUNITY): Admission: RE | Admit: 2020-04-07 | Payer: 59 | Source: Home / Self Care | Admitting: Obstetrics & Gynecology

## 2020-04-07 SURGERY — HYSTERECTOMY, TOTAL, ABDOMINAL, WITH SALPINGECTOMY
Anesthesia: Choice

## 2020-04-10 ENCOUNTER — Ambulatory Visit: Payer: 59 | Admitting: Gynecologic Oncology

## 2020-04-13 ENCOUNTER — Other Ambulatory Visit: Payer: Self-pay

## 2020-04-13 ENCOUNTER — Encounter: Payer: Self-pay | Admitting: Medical

## 2020-04-13 ENCOUNTER — Ambulatory Visit (INDEPENDENT_AMBULATORY_CARE_PROVIDER_SITE_OTHER): Payer: Self-pay | Admitting: Medical

## 2020-04-13 VITALS — BP 154/80 | HR 92 | Resp 18 | Ht 62.0 in | Wt 210.0 lb

## 2020-04-13 DIAGNOSIS — R739 Hyperglycemia, unspecified: Secondary | ICD-10-CM

## 2020-04-13 DIAGNOSIS — D649 Anemia, unspecified: Secondary | ICD-10-CM

## 2020-04-13 DIAGNOSIS — F411 Generalized anxiety disorder: Secondary | ICD-10-CM

## 2020-04-13 DIAGNOSIS — I10 Essential (primary) hypertension: Secondary | ICD-10-CM

## 2020-04-13 DIAGNOSIS — J309 Allergic rhinitis, unspecified: Secondary | ICD-10-CM

## 2020-04-13 MED ORDER — AMLODIPINE BESYLATE 5 MG PO TABS: 5.0000 mg | ORAL_TABLET | Freq: Every day | ORAL | 3 refills | Status: DC

## 2020-04-13 MED ORDER — LOSARTAN POTASSIUM-HCTZ 100-12.5 MG PO TABS: 1.0000 | ORAL_TABLET | Freq: Every day | ORAL | 3 refills | Status: DC

## 2020-04-13 MED ORDER — PROPRANOLOL HCL ER 120 MG PO CP24: 120.0000 mg | ORAL_CAPSULE | Freq: Every day | ORAL | 3 refills | Status: DC

## 2020-04-13 NOTE — Patient Instructions (Signed)
Your bp is high today and not controlled by hx with losartan/hctz and propanolol. Will add amlodipine 5 mg to your regimen. Check bp in 14 days on follow up .  For depression and anxiety continue prozac.  For allergic rhinitis continue zyrtec.  For anemia should improve after surgery for fibroids. Continue iron.  Follow up 2 weeks bp check or as needed.

## 2020-04-13 NOTE — Progress Notes (Signed)
Subjective:    Patient ID: Traci Frank, female    DOB: [birth year], 39 y.o.   MRN: [id number]  HPI  Pt in for first time.  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 Pt does not exercise. Moderate healthy diet but not always since fibroids make her feel bloated. Rare alcohol use. Nonsmoker.  Pt states her readings even on meds in the past would be 150/90 at best.   Pt has history of htn, allergies, anemia and depression.  Pt has been on losartan/hctz and propanolol. On these med's for 2-3 years.  Pt states she ran out of BP meds one week ago. Even before last week she states her bp were not well controlled.   Pt state allergies controlled when takes zyrtec.Marland Kitchen  Pt depression controlled with prozac.  Pt has hx of anemia related to fibroids.  She has plans to get hysterectomy next month but needs her bp better controlled.  lmp- today.   Review of Systems  Constitutional: Negative for chills, fatigue and fever.  Respiratory: Negative for cough, chest tightness, shortness of breath and wheezing.   Cardiovascular: Negative for chest pain and palpitations.  Gastrointestinal: Negative for abdominal pain, blood in stool and constipation.  Genitourinary: Negative for dysuria, flank pain and hematuria.  Musculoskeletal: Negative for back pain.  Skin: Negative for rash.  Neurological: Negative for dizziness, seizures, numbness and headaches.  Hematological: Negative for adenopathy. Does not bruise/bleed easily.  Psychiatric/Behavioral: Negative for behavioral problems and confusion.    Past Medical History:  Diagnosis Date   Abnormal involuntary movements(781.0)    Right hand   Allergy    Depression    Disturbance of skin sensation    Right arm   Headache(784.0)    Hypertension    Tingling sensation 05/27/2013   Tremor of unknown origin 05/27/2013   Uterine fibroid    Vertigo      Social History   Socioeconomic History   Marital status:  Single    Spouse name: Not on file   Number of children: Not on file   Years of education: Not on file   Highest education level: Not on file  Occupational History   Occupation: call center rep    Employer: [employer]  Tobacco Use   Smoking status: Never Smoker   Smokeless tobacco: Never Used  Vaping Use   Vaping Use: Never used  Substance and Sexual Activity   Alcohol use: Not Currently    Alcohol/week: 0.0 standard drinks   Drug use: No   Sexual activity: Yes    Partners: Male    Birth control/protection: Condom  Other Topics Concern   Not on file  Social History Narrative   Not on file   Social Determinants of Health   Financial Resource Strain:    Difficulty of Paying Living Expenses: Not on file  Food Insecurity:    Worried About Charity fundraiser in the Last Year: Not on file   [employer] of Food in the Last Year: Not on file  Transportation Needs:    Lack of Transportation (Medical): Not on file   Lack of Transportation (Non-Medical): Not on file  Physical Activity:    Days of Exercise per Week: Not on file   Minutes of Exercise per Session: Not on file  Stress:    Feeling of Stress : Not on file  Social Connections:    Frequency of Communication with Friends and Family: Not on file  Frequency of Social Gatherings with Friends and Family: Not on file   Attends Religious Services: Not on file   Active Member of Clubs or Organizations: Not on file   Attends Archivist Meetings: Not on file   Marital Status: Not on file  Intimate Partner Violence:    Fear of Current or Ex-Partner: Not on file   Emotionally Abused: Not on file   Physically Abused: Not on file   Sexually Abused: Not on file    Past Surgical History:  Procedure Laterality Date   MYOMECTOMY      Family History  Problem Relation Age of Onset   Hypertension Mother    Drug abuse Father    Diabetes Maternal Grandmother    Diabetes Maternal  Grandfather    Stroke Paternal Grandfather    Bipolar disorder Cousin     Allergies  Allergen Reactions   Latex Itching    Current Outpatient Medications on File Prior to Visit  Medication Sig Dispense Refill   [START ON 05/08/2020] enoxaparin (LOVENOX) 40 MG/0.4ML injection Inject 0.4 mLs (40 mg total) into the skin daily for 28 doses. For AFTER surgery only 11.2 mL 0   [START ON 05/06/2020] erythromycin base (E-MYCIN) 500 MG tablet Take 2 tablets at 2 pm, 3 pm, and 10 pm the day before surgery 6 tablet 0   ferrous gluconate (FERGON) 324 MG tablet Take 1 tablet (324 mg total) by mouth 3 (three) times daily with meals. 90 tablet 3   FLUoxetine (PROZAC) 10 MG capsule TAKE 3 CAPSULES (30 MG TOTAL) BY MOUTH DAILY. (Patient taking differently: Take 20 mg by mouth daily. ) 270 capsule 0   ibuprofen (ADVIL) 600 MG tablet Take 1 tablet (600 mg total) by mouth every 8 (eight) hours as needed for moderate pain. For AFTER surgery only. Use sparingly as needed. 30 tablet 0   Multiple Vitamin (THERA) TABS Take 1 tablet by mouth daily.     cetirizine (ZYRTEC) 10 MG tablet Take 1 tablet by mouth daily in the afternoon. (Patient not taking: Reported on 04/13/2020)     ibuprofen (ADVIL,MOTRIN) 200 MG tablet Take 200 mg by mouth every 6 (six) hours as needed. (Patient not taking: Reported on 04/13/2020)     losartan-hydrochlorothiazide (HYZAAR) 100-12.5 MG tablet Take 1 tablet by mouth daily. (Patient not taking: Reported on 03/04/2020)     neomycin (MYCIFRADIN) 500 MG tablet Take 2 tablets at 2 pm, 3 pm, and 10 pm the day before surgery (Patient not taking: Reported on 04/13/2020) 6 tablet 0   ondansetron (ZOFRAN ODT) 4 MG disintegrating tablet Take 1 tablet (4 mg total) by mouth every 4 (four) hours as needed for nausea or vomiting. (Patient not taking: Reported on 04/13/2020) 20 tablet 0   oxyCODONE (OXY IR/ROXICODONE) 5 MG immediate release tablet Take 1 tablet (5 mg total) by mouth every 4 (four)  hours as needed for severe pain. For AFTER surgery only, do not take and drive (Patient not taking: Reported on 04/13/2020) 20 tablet 0   oxyCODONE-acetaminophen (PERCOCET) 5-325 MG tablet Take 1 tablet by mouth every 6 (six) hours as needed. (Patient not taking: Reported on 04/13/2020) 20 tablet 0   propranolol ER (INDERAL LA) 120 MG 24 hr capsule Take 120 mg by mouth daily. (Patient not taking: Reported on 03/30/2020)     senna (SENOKOT) 8.6 MG TABS tablet Take 1 tablet (8.6 mg total) by mouth at bedtime. (Patient not taking: Reported on 04/13/2020) 120 tablet 0   senna-docusate (  SENOKOT-S) 8.6-50 MG tablet Take 2 tablets by mouth at bedtime. For AFTER surgery, do not take if having diarrhea (Patient not taking: Reported on 04/13/2020) 30 tablet 0   No current facility-administered medications on file prior to visit.    BP (!) 150/96    Pulse 92    Resp 18    Ht 5\' 2"  (1.575 m)    Wt 210 lb (95.3 kg)    SpO2 96%    BMI 38.41 kg/m       Objective:   Physical Exam   General Mental Status- Alert. General Appearance- Not in acute distress.   Skin General: Color- Normal Color. Moisture- Normal Moisture.  Neck Carotid Arteries- Normal color. Moisture- Normal Moisture. No carotid bruits. No JVD.  Chest and Lung Exam Auscultation: Breath Sounds:-Normal.  Cardiovascular Auscultation:Rythm- Regular. Murmurs & Other Heart Sounds:Auscultation of the heart reveals- No Murmurs.  Abdomen Inspection:-Inspeection Normal. Palpation/Percussion:Note:No mass. Palpation and Percussion of the abdomen reveal- Non Tender, Non Distended + BS, no rebound or guarding.   NeurologicCranial Nerve exam:- CN III-XII intact(No nystagmus), symmetric smile. Strength:- 5/5 equal and symmetric strength both upper and lower extremities.     Assessment & Plan:  Your bp is high today and not controlled by hx with losartan/hctz and propanolol. Will add amlodipine 5 mg to your regimen. Check bp in 14 days on  follow up .  For depression and anxiety continue prozac.  For allergic rhinitis continue zyrtec.  For anemia should improve after surgery for fibroids. Continue iron.  Follow up 2 weeks bp check or as needed.

## 2020-04-15 ENCOUNTER — Telehealth: Payer: Self-pay | Admitting: Medical

## 2020-04-15 MED ORDER — LOSARTAN POTASSIUM-HCTZ 100-12.5 MG PO TABS: 1.0000 | ORAL_TABLET | Freq: Every day | ORAL | 3 refills | Status: DC

## 2020-04-15 MED ORDER — AMLODIPINE BESYLATE 5 MG PO TABS
5.0000 mg | ORAL_TABLET | Freq: Every day | ORAL | 3 refills | Status: DC
Start: 2020-04-15 — End: 2020-06-01

## 2020-04-15 MED ORDER — PROPRANOLOL HCL ER 120 MG PO CP24
120.0000 mg | ORAL_CAPSULE | Freq: Every day | ORAL | 3 refills | Status: DC
Start: 2020-04-15 — End: 2020-05-04

## 2020-04-15 NOTE — Telephone Encounter (Signed)
Patient would like prescription to be transfer to a different pharmacy due to cost  amLODipine (NORVASC) 5 MG tablet [[id number]]   propranolol ER (INDERAL LA) 120 MG 24 hr capsule [[id number]]   losartan-hydrochlorothiazide (HYZAAR) 100-12.5 MG tablet [[id number]]    [facility] - [place], [place] - [address] Phone:  [phone]  Fax:  [phone]

## 2020-04-15 NOTE — Telephone Encounter (Signed)
Rx sent 

## 2020-04-27 ENCOUNTER — Encounter: Payer: Self-pay | Admitting: Medical

## 2020-04-27 ENCOUNTER — Ambulatory Visit (INDEPENDENT_AMBULATORY_CARE_PROVIDER_SITE_OTHER): Payer: Self-pay | Admitting: Medical

## 2020-04-27 ENCOUNTER — Other Ambulatory Visit: Payer: Self-pay

## 2020-04-27 VITALS — BP 145/88 | HR 96 | Resp 18 | Ht 62.0 in | Wt 207.8 lb

## 2020-04-27 DIAGNOSIS — I10 Essential (primary) hypertension: Secondary | ICD-10-CM

## 2020-04-27 MED ORDER — CHLORTHALIDONE 25 MG PO TABS: 25.0000 mg | ORAL_TABLET | Freq: Every day | ORAL | 0 refills | Status: DC

## 2020-04-27 MED ORDER — LOSARTAN POTASSIUM 100 MG PO TABS: 100.0000 mg | ORAL_TABLET | Freq: Every day | ORAL | 0 refills | Status: DC

## 2020-04-27 NOTE — Progress Notes (Signed)
Subjective:    Patient ID: Traci Frank, female    DOB: [birth year], 39 y.o.   MRN: [id number]  HPI Pt in for follow up.  Pt was on losartan hctz., amlodipine and propanlol. Pt states when she took her propanolol cause ha.   When she stopped propanolol her ha stopped.   Pt has not checked her bp since last visit.   Today no cardiac or neurologic signs/symptoms.  lmp- April 13, 2020. Pt upcoming week has surgery for hystercotomy on Dec 22 nd.     Review of Systems  Constitutional: Negative for chills, fatigue and fever.  Respiratory: Negative for cough, chest tightness, shortness of breath and wheezing.   Cardiovascular: Negative for chest pain and palpitations.  Gastrointestinal: Negative for abdominal pain.  Musculoskeletal: Negative for back pain.  Neurological: Negative for dizziness, syncope, numbness and headaches.       See hpi.  Hematological: Negative for adenopathy. Does not bruise/bleed easily.  Psychiatric/Behavioral: Negative for behavioral problems and confusion.    Past Medical History:  Diagnosis Date   Abnormal involuntary movements(781.0)    Right hand   Allergy    Depression    Disturbance of skin sensation    Right arm   Headache(784.0)    Hypertension    Tingling sensation 05/27/2013   Tremor of unknown origin 05/27/2013   Uterine fibroid    Vertigo      Social History   Socioeconomic History   Marital status: Single    Spouse name: Not on file   Number of children: Not on file   Years of education: Not on file   Highest education level: Not on file  Occupational History   Occupation: call center rep    Employer: [employer]  Tobacco Use   Smoking status: Never Smoker   Smokeless tobacco: Never Used  Vaping Use   Vaping Use: Never used  Substance and Sexual Activity   Alcohol use: Not Currently    Alcohol/week: 0.0 standard drinks   Drug use: No   Sexual activity: Yes    Partners: Male    Birth  control/protection: Condom  Other Topics Concern   Not on file  Social History Narrative   Not on file   Social Determinants of Health   Financial Resource Strain:    Difficulty of Paying Living Expenses: Not on file  Food Insecurity:    Worried About Charity fundraiser in the Last Year: Not on file   [employer] of Food in the Last Year: Not on file  Transportation Needs:    Lack of Transportation (Medical): Not on file   Lack of Transportation (Non-Medical): Not on file  Physical Activity:    Days of Exercise per Week: Not on file   Minutes of Exercise per Session: Not on file  Stress:    Feeling of Stress : Not on file  Social Connections:    Frequency of Communication with Friends and Family: Not on file   Frequency of Social Gatherings with Friends and Family: Not on file   Attends Religious Services: Not on file   Active Member of Clubs or Organizations: Not on file   Attends Archivist Meetings: Not on file   Marital Status: Not on file  Intimate Partner Violence:    Fear of Current or Ex-Partner: Not on file   Emotionally Abused: Not on file   Physically Abused: Not on file   Sexually Abused: Not on file  Past Surgical History:  Procedure Laterality Date   MYOMECTOMY      Family History  Problem Relation Age of Onset   Hypertension Mother    Drug abuse Father    Diabetes Maternal Grandmother    Diabetes Maternal Grandfather    Stroke Paternal Grandfather    Bipolar disorder Cousin     Allergies  Allergen Reactions   Latex Itching    Current Outpatient Medications on File Prior to Visit  Medication Sig Dispense Refill   amLODipine (NORVASC) 5 MG tablet Take 1 tablet (5 mg total) by mouth daily. 30 tablet 3   cetirizine (ZYRTEC) 10 MG tablet Take 10 mg by mouth daily in the afternoon.      ferrous gluconate (FERGON) 324 MG tablet Take 1 tablet (324 mg total) by mouth 3 (three) times daily with meals. 90 tablet 3     FLUoxetine (PROZAC) 10 MG capsule TAKE 3 CAPSULES (30 MG TOTAL) BY MOUTH DAILY. 270 capsule 0   LORazepam (ATIVAN) 0.5 MG tablet Take 0.5 mg by mouth every 8 (eight) hours as needed for anxiety.     [START ON 05/08/2020] enoxaparin (LOVENOX) 40 MG/0.4ML injection Inject 0.4 mLs (40 mg total) into the skin daily for 28 doses. For AFTER surgery only (Patient not taking: Reported on 04/27/2020) 11.2 mL 0   [START ON 05/06/2020] erythromycin base (E-MYCIN) 500 MG tablet Take 2 tablets at 2 pm, 3 pm, and 10 pm the day before surgery (Patient not taking: Reported on 04/27/2020) 6 tablet 0   ibuprofen (ADVIL) 600 MG tablet Take 1 tablet (600 mg total) by mouth every 8 (eight) hours as needed for moderate pain. For AFTER surgery only. Use sparingly as needed. (Patient not taking: Reported on 04/27/2020) 30 tablet 0   ibuprofen (ADVIL,MOTRIN) 200 MG tablet Take 200 mg by mouth every 6 (six) hours as needed. (Patient not taking: Reported on 04/13/2020)     Multiple Vitamin (THERA) TABS Take 1 tablet by mouth daily. (Patient not taking: Reported on 04/22/2020)     neomycin (MYCIFRADIN) 500 MG tablet Take 2 tablets at 2 pm, 3 pm, and 10 pm the day before surgery (Patient not taking: Reported on 04/27/2020) 6 tablet 0   ondansetron (ZOFRAN ODT) 4 MG disintegrating tablet Take 1 tablet (4 mg total) by mouth every 4 (four) hours as needed for nausea or vomiting. (Patient not taking: Reported on 04/27/2020) 20 tablet 0   oxyCODONE (OXY IR/ROXICODONE) 5 MG immediate release tablet Take 1 tablet (5 mg total) by mouth every 4 (four) hours as needed for severe pain. For AFTER surgery only, do not take and drive (Patient not taking: Reported on 04/27/2020) 20 tablet 0   oxyCODONE-acetaminophen (PERCOCET) 5-325 MG tablet Take 1 tablet by mouth every 6 (six) hours as needed. (Patient not taking: Reported on 04/13/2020) 20 tablet 0   propranolol ER (INDERAL LA) 120 MG 24 hr capsule Take 1 capsule (120 mg total) by  mouth daily. (Patient not taking: Reported on 04/27/2020) 30 capsule 3   senna (SENOKOT) 8.6 MG TABS tablet Take 1 tablet (8.6 mg total) by mouth at bedtime. (Patient not taking: Reported on 04/13/2020) 120 tablet 0   senna-docusate (SENOKOT-S) 8.6-50 MG tablet Take 2 tablets by mouth at bedtime. For AFTER surgery, do not take if having diarrhea (Patient not taking: Reported on 04/13/2020) 30 tablet 0   No current facility-administered medications on file prior to visit.    BP (!) 145/88    Pulse 96  Resp 18    Ht 5\' 2"  (1.575 m)    Wt 207 lb 12.8 oz (94.3 kg)    SpO2 98%    BMI 38.01 kg/m      Objective:   Physical Exam  General Mental Status- Alert. General Appearance- Not in acute distress.   Skin General: Color- Normal Color. Moisture- Normal Moisture.  Neck Carotid Arteries- Normal color. Moisture- Normal Moisture. No carotid bruits. No JVD.  Chest and Lung Exam Auscultation: Breath Sounds:-Normal.  Cardiovascular Auscultation:Rythm- Regular. Murmurs & Other Heart Sounds:Auscultation of the heart reveals- No Murmurs.  Abdomen Inspection:-Inspeection Normal. Palpation/Percussion:Note:No mass. Palpation and Percussion of the abdomen reveal- Non Tender, Non Distended + BS, no rebound or guarding.    Neurologic Cranial Nerve exam:- CN III-XII intact(No nystagmus), symmetric smile. Strength:- 5/5 equal and symmetric strength both upper and lower extremities.      Assessment & Plan:  You do have history of elevated blood pressure.  Upcoming surgery and want your blood pressure to be better controlled by then.  Presently stop losartan/HCTZ and will prescribe losartan 100 mg tab separately to use in combination with chlorthalidone 25 mg tablet.  Continue amlodipine 5 mg daily.  If by Thursday morning your systolic blood pressure is not less than 140 then increase amlodipine to 10 mg daily.  Stop propanolol as you reported side effect.  Follow-up 1 week from today to  have repeat blood pressure check prior to your surgery.

## 2020-04-27 NOTE — Patient Instructions (Addendum)
You do have history of elevated blood pressure.  Upcoming surgery and want your blood pressure to be better controlled by then.  Presently stop losartan/HCTZ and will prescribe losartan 100 mg tab separately to use in combination with chlorthalidone 25 mg tablet.  Continue amlodipine 5 mg daily.  If by Thursday morning your systolic blood pressure is not less than 140 then increase amlodipine to 10 mg daily.  Stop propanol as you reported side effect.  Follow-up 1 week from today to have repeat blood pressure check prior to your surgery.

## 2020-04-28 ENCOUNTER — Encounter (HOSPITAL_COMMUNITY): Payer: Self-pay

## 2020-04-28 ENCOUNTER — Encounter (HOSPITAL_COMMUNITY)
Admission: RE | Admit: 2020-04-28 | Discharge: 2020-04-28 | Disposition: A | Discharge status: Home / Self Care | Payer: Medicaid Other | Source: Ambulatory Visit | Attending: Gynecologic Oncology | Admitting: Gynecologic Oncology

## 2020-04-28 ENCOUNTER — Other Ambulatory Visit: Payer: Self-pay

## 2020-04-28 DIAGNOSIS — Z01818 Encounter for other preprocedural examination: Secondary | ICD-10-CM | POA: Insufficient documentation

## 2020-04-28 HISTORY — DX: Anxiety disorder, unspecified: F41.9

## 2020-04-28 HISTORY — DX: Anemia, unspecified: D64.9

## 2020-04-28 LAB — COMPREHENSIVE METABOLIC PANEL
ALT: 14 U/L (ref 0–44)
AST: 17 U/L (ref 15–41)
Albumin: 3.8 g/dL (ref 3.5–5.0)
Alkaline Phosphatase: 55 U/L (ref 38–126)
Anion gap: 10 (ref 5–15)
BUN: 11 mg/dL (ref 6–20)
CO2: 23 mmol/L (ref 22–32)
Calcium: 9.1 mg/dL (ref 8.9–10.3)
Chloride: 101 mmol/L (ref 98–111)
Creatinine, Ser: 0.83 mg/dL (ref 0.44–1.00)
GFR, Estimated: >60 mL/min (ref >60)
Glucose, Bld: 114 mg/dL — ABNORMAL HIGH (ref 70–99)
Potassium: 3.3 mmol/L — ABNORMAL LOW (ref 3.5–5.1)
Sodium: 134 mmol/L — ABNORMAL LOW (ref 135–145)
Total Bilirubin: 0.8 mg/dL (ref 0.3–1.2)
Total Protein: 7.4 g/dL (ref 6.5–8.1)

## 2020-04-28 LAB — CBC
HCT: 37.4 % (ref 36.0–46.0)
Hemoglobin: 11.4 g/dL — ABNORMAL LOW (ref 12.0–15.0)
MCH: 24.6 pg — ABNORMAL LOW (ref 26.0–34.0)
MCHC: 30.5 g/dL (ref 30.0–36.0)
MCV: 80.6 fL (ref 80.0–100.0)
Platelets: 418 10*3/uL — ABNORMAL HIGH (ref 150–400)
RBC: 4.64 MIL/uL (ref 3.87–5.11)
RDW: 18.6 % — ABNORMAL HIGH (ref 11.5–15.5)
WBC: 4.9 10*3/uL (ref 4.0–10.5)
nRBC: 0 % (ref 0.0–0.2)

## 2020-04-28 LAB — URINALYSIS, ROUTINE W REFLEX MICROSCOPIC
Bilirubin Urine: NEGATIVE
Glucose, UA: NEGATIVE mg/dL
Hgb urine dipstick: NEGATIVE
Ketones, ur: NEGATIVE mg/dL
Leukocytes,Ua: NEGATIVE
Nitrite: NEGATIVE
Protein, ur: NEGATIVE mg/dL
Specific Gravity, Urine: 1.005 (ref 1.005–1.030)
pH: 7 (ref 5.0–8.0)

## 2020-04-28 NOTE — Patient Instructions (Addendum)
DUE TO COVID-19 ONLY ONE VISITOR IS ALLOWED TO COME WITH YOU AND STAY IN THE WAITING ROOM ONLY DURING PRE OP AND PROCEDURE DAY OF SURGERY. THE 1 VISITOR  MAY VISIT WITH YOU AFTER SURGERY IN YOUR PRIVATE ROOM DURING VISITING HOURS ONLY!  YOU NEED TO HAVE A COVID 19 TEST ON: 05/04/20 @  1:00 AM , THIS TEST MUST BE DONE BEFORE SURGERY,  COVID TESTING SITE [address] [place] [address], IT IS ON THE RIGHT GOING OUT [address] APPROXIMATELY  2 MINUTES PAST [employer] ON THE RIGHT. ONCE YOUR COVID TEST IS COMPLETED,  PLEASE BEGIN THE QUARANTINE INSTRUCTIONS AS OUTLINED IN YOUR HANDOUT.                Traci Frank    Your procedure is scheduled on: 05/07/20   Report to [facility] Main  Entrance   Report to short stay at: 5:30 AM     Call this number if you have problems the morning of surgery [phone]    Remember:  DRINK 2 PRESURGERY ENSURE DRINKS THE NIGHT BEFORE SURGERY AT  1000 PM AND 1 PRESURGERY DRINK THE DAY OF THE PROCEDURE 3 HOURS PRIOR TO SCHEDULED SURGERY. NO SOLIDS AFTER MIDNIGHT THE DAY PRIOR TO THE SURGERY. NOTHING BY MOUTH EXCEPT CLEAR LIQUIDS UNTIL THREE HOURS PRIOR TO SCHEDULED SURGERY( 4:30 AM). PLEASE FINISH PRESURGERY ENSURE DRINK PER SURGEON ORDER 3 HOURS PRIOR TO SCHEDULED SURGERY TIME WHICH NEEDS TO BE COMPLETED AT: 4:30 AM.  CLEAR LIQUID DIET   Foods Allowed                                                                     Foods Excluded  Coffee and tea, regular and decaf                             liquids that you cannot  Plain Jell-O any favor except red or purple                                           see through such as: Fruit ices (not with fruit pulp)                                     milk, soups, orange juice  Iced Popsicles                                    All solid food Carbonated beverages, regular and diet                                    Cranberry, grape and apple juices Sports drinks like  Gatorade Lightly seasoned clear broth or consume(fat free) Sugar, honey syrup  Sample Menu Breakfast  Lunch                                     Supper Cranberry juice                    Beef broth                            Chicken broth Jell-O                                     Grape juice                           Apple juice Coffee or tea                        Jell-O                                      Popsicle                                                Coffee or tea                        Coffee or tea  _____________________________________________________________________  BRUSH YOUR TEETH MORNING OF SURGERY AND RINSE YOUR MOUTH OUT, NO CHEWING GUM CANDY OR MINTS.     Take these medicines the morning of surgery with A SIP OF WATER: amlodipine,prozac,propranolol.Ativan as needed.                               You may not have any metal on your body including hair pins and              piercings  Do not wear jewelry, make-up, lotions, powders or perfumes, deodorant             Do not wear nail polish on your fingernails.  Do not shave  48 hours prior to surgery.              Men may shave face and neck.   Do not bring valuables to the hospital. [place].  Contacts, dentures or bridgework may not be worn into surgery.  Leave suitcase in the car. After surgery it may be brought to your room.     Patients discharged the day of surgery will not be allowed to drive home. IF YOU ARE HAVING SURGERY AND GOING HOME THE SAME DAY, YOU MUST HAVE AN ADULT TO DRIVE YOU HOME AND BE WITH YOU FOR 24 HOURS. YOU MAY GO HOME BY TAXI OR UBER OR ORTHERWISE, BUT AN ADULT MUST ACCOMPANY YOU HOME AND STAY WITH YOU FOR 24 HOURS.  Name and phone number of your driver:  Special Instructions: N/A  Please read over the following fact sheets you were  given: _____________________________________________________________________         [facility] - Preparing for Surgery Before surgery, you can play an important role.  Because skin is not sterile, your skin needs to be as free of germs as possible.  You can reduce the number of germs on your skin by washing with CHG (chlorahexidine gluconate) soap before surgery.  CHG is an antiseptic cleaner which kills germs and bonds with the skin to continue killing germs even after washing. Please DO NOT use if you have an allergy to CHG or antibacterial soaps.  If your skin becomes reddened/irritated stop using the CHG and inform your nurse when you arrive at Short Stay. Do not shave (including legs and underarms) for at least 48 hours prior to the first CHG shower.  You may shave your face/neck. Please follow these instructions carefully:  1.  Shower with CHG Soap the night before surgery and the  morning of Surgery.  2.  If you choose to wash your hair, wash your hair first as usual with your  normal  shampoo.  3.  After you shampoo, rinse your hair and body thoroughly to remove the  shampoo.                           4.  Use CHG as you would any other liquid soap.  You can apply chg directly  to the skin and wash                       Gently with a scrungie or clean washcloth.  5.  Apply the CHG Soap to your body ONLY FROM THE NECK DOWN.   Do not use on face/ open                           Wound or open sores. Avoid contact with eyes, ears mouth and genitals (private parts).                       Wash face,  Genitals (private parts) with your normal soap.             6.  Wash thoroughly, paying special attention to the area where your surgery  will be performed.  7.  Thoroughly rinse your body with warm water from the neck down.  8.  DO NOT shower/wash with your normal soap after using and rinsing off  the CHG Soap.                9.  Pat yourself dry with a clean towel.            10.  Wear clean  pajamas.            11.  Place clean sheets on your bed the night of your first shower and do not  sleep with pets. Day of Surgery : Do not apply any lotions/deodorants the morning of surgery.  Please wear clean clothes to the hospital/surgery center.  FAILURE TO FOLLOW THESE INSTRUCTIONS MAY RESULT IN THE CANCELLATION OF YOUR SURGERY PATIENT SIGNATURE_________________________________  NURSE SIGNATURE__________________________________  ________________________________________________________________________

## 2020-04-29 ENCOUNTER — Telehealth: Payer: Self-pay

## 2020-04-29 NOTE — Telephone Encounter (Signed)
[name] that her potassium is slightly low on her pre op labs yesterday. Instructed her to increase potassium rich food in her diet like bananas and OJ per Melissa Cross,NP. Pt verbalized understanding.

## 2020-05-04 ENCOUNTER — Other Ambulatory Visit: Payer: Self-pay

## 2020-05-04 ENCOUNTER — Ambulatory Visit (INDEPENDENT_AMBULATORY_CARE_PROVIDER_SITE_OTHER): Payer: Self-pay | Admitting: Medical

## 2020-05-04 ENCOUNTER — Other Ambulatory Visit (HOSPITAL_COMMUNITY)
Admission: RE | Admit: 2020-05-04 | Discharge: 2020-05-04 | Disposition: A | Discharge status: Home / Self Care | Payer: Medicaid Other | Source: Ambulatory Visit | Attending: Gynecologic Oncology | Admitting: Gynecologic Oncology

## 2020-05-04 VITALS — BP 125/66 | HR 87 | Resp 18 | Ht 62.0 in | Wt 208.4 lb

## 2020-05-04 DIAGNOSIS — F3342 Major depressive disorder, recurrent, in full remission: Secondary | ICD-10-CM

## 2020-05-04 DIAGNOSIS — Z20822 Contact with and (suspected) exposure to covid-19: Secondary | ICD-10-CM | POA: Insufficient documentation

## 2020-05-04 DIAGNOSIS — I10 Essential (primary) hypertension: Secondary | ICD-10-CM

## 2020-05-04 DIAGNOSIS — F411 Generalized anxiety disorder: Secondary | ICD-10-CM

## 2020-05-04 DIAGNOSIS — Z01812 Encounter for preprocedural laboratory examination: Secondary | ICD-10-CM | POA: Diagnosis present

## 2020-05-04 DIAGNOSIS — R5383 Other fatigue: Secondary | ICD-10-CM

## 2020-05-04 DIAGNOSIS — J309 Allergic rhinitis, unspecified: Secondary | ICD-10-CM

## 2020-05-04 DIAGNOSIS — E010 Iodine-deficiency related diffuse (endemic) goiter: Secondary | ICD-10-CM

## 2020-05-04 LAB — SARS CORONAVIRUS 2 (TAT 6-24 HRS): SARS Coronavirus 2: NEGATIVE

## 2020-05-04 NOTE — Patient Instructions (Addendum)
Your blood pressure is well controlled today.  Continue with losartan, chlorthalidone and amlodipine.  History of depression and anxiety.  Overall depression controlled with Prozac.  You do report some degree of anxiety since losing job.  I did offer you brief low number prescription of Ativan but that was declined today.  There are other options such as BuSpar if your anxiety worsens.  Palpitations recently very brief about once a day lasting for 1 minute or less.  Last EKG 1 week ago was normal.  Anxiety might be playing a role and might be beneficial to use medication such as Ativan to see if palpitations resolve completely.  Also continue to avoid caffeine beverages.  If palpitations continue to recur would refer to cardiologist.  Recommend that you wear your smart watch and note pulse readings when having palpitations.  For your recent fatigue and appearance of thyromegaly on exam will get TSH, T4 and ultrasound thyroid/neck.  Follow-up date to be determined after lab review.

## 2020-05-04 NOTE — Progress Notes (Signed)
Subjective:    Patient ID: Traci Frank, female    DOB: 09-11-1980, 39 y.o.   MRN: [id number]  HPI  Pt in for follow up.  Pt has htn. Pt is on losartan 100 mg daily, chlorthalidone 25 mg daily and amlodipine 10 mg daily.  Her bp is better now. She has been checking at home various times. No cardiac or neurologic signs or symptoms.  Pt has year round allergies. Controlled with zyrtec.   Pt has depression and anxiety. Controlled with prozac 30 mg daily. But does admit anxiety since loss of job.   Pt states every day has random intermittent brief palpitation last for 1 minute or so. She will take some deep breath and will resolve. Pt last ekg was normal in past one week ago. She thinks maybe some anxiety related. Pt minimizing tea.      Review of Systems  Constitutional: Negative for chills, fatigue and fever.  Respiratory: Negative for cough, chest tightness, shortness of breath and wheezing.   Cardiovascular: Negative for chest pain and palpitations.  Gastrointestinal: Negative for abdominal pain, blood in stool and diarrhea.  Musculoskeletal: Negative for back pain.  Skin: Negative for rash.  Neurological: Negative for dizziness, numbness and headaches.  Hematological: Negative for adenopathy.  Psychiatric/Behavioral: Negative for behavioral problems and confusion.   Past Medical History:  Diagnosis Date  . Abnormal involuntary movements(781.0)    Right hand  . Allergy   . Anemia   . Anxiety   . Depression   . Disturbance of skin sensation    Right arm  . Headache(784.0)   . Hypertension   . Tingling sensation 05/27/2013  . Tremor of unknown origin 05/27/2013  . Uterine fibroid   . Vertigo      Social History   Socioeconomic History  . Marital status: Single    Spouse name: Not on file  . Number of children: Not on file  . Years of education: Not on file  . Highest education level: Not on file  Occupational History  . Occupation: call center rep     Employer: [employer]  Tobacco Use  . Smoking status: Never Smoker  . Smokeless tobacco: Never Used  Vaping Use  . Vaping Use: Never used  Substance and Sexual Activity  . Alcohol use: Yes    Alcohol/week: 0.0 standard drinks    Comment: rarely  . Drug use: No  . Sexual activity: Yes    Partners: Male    Birth control/protection: Condom  Other Topics Concern  . Not on file  Social History Narrative  . Not on file   Social Determinants of Health   Financial Resource Strain:   . Difficulty of Paying Living Expenses: Not on file  Food Insecurity:   . Worried About Charity fundraiser in the Last Year: Not on file  . Ran Out of Food in the Last Year: Not on file  Transportation Needs:   . Lack of Transportation (Medical): Not on file  . Lack of Transportation (Non-Medical): Not on file  Physical Activity:   . Days of Exercise per Week: Not on file  . Minutes of Exercise per Session: Not on file  Stress:   . Feeling of Stress : Not on file  Social Connections:   . Frequency of Communication with Friends and Family: Not on file  . Frequency of Social Gatherings with Friends and Family: Not on file  . Attends Religious Services: Not on file  . Active  Member of Clubs or Organizations: Not on file  . Attends Archivist Meetings: Not on file  . Marital Status: Not on file  Intimate Partner Violence:   . Fear of Current or Ex-Partner: Not on file  . Emotionally Abused: Not on file  . Physically Abused: Not on file  . Sexually Abused: Not on file    Past Surgical History:  Procedure Laterality Date  . HERNIA REPAIR    . MYOMECTOMY      Family History  Problem Relation Age of Onset  . Hypertension Mother   . Drug abuse Father   . Diabetes Maternal Grandmother   . Diabetes Maternal Grandfather   . Stroke Paternal Grandfather   . Bipolar disorder Cousin     Allergies  Allergen Reactions  . Latex Itching    Current Outpatient Medications on File Prior to  Visit  Medication Sig Dispense Refill  . amLODipine (NORVASC) 5 MG tablet Take 1 tablet (5 mg total) by mouth daily. 30 tablet 3  . cetirizine (ZYRTEC) 10 MG tablet Take 10 mg by mouth daily in the afternoon.     . chlorthalidone (HYGROTON) 25 MG tablet Take 1 tablet (25 mg total) by mouth daily. 30 tablet 0  . [START ON 05/08/2020] enoxaparin (LOVENOX) 40 MG/0.4ML injection Inject 0.4 mLs (40 mg total) into the skin daily for 28 doses. For AFTER surgery only (Patient not taking: Reported on 04/27/2020) 11.2 mL 0  . [START ON 05/06/2020] erythromycin base (E-MYCIN) 500 MG tablet Take 2 tablets at 2 pm, 3 pm, and 10 pm the day before surgery (Patient not taking: Reported on 04/27/2020) 6 tablet 0  . ferrous gluconate (FERGON) 324 MG tablet Take 1 tablet (324 mg total) by mouth 3 (three) times daily with meals. 90 tablet 3  . FLUoxetine (PROZAC) 10 MG capsule TAKE 3 CAPSULES (30 MG TOTAL) BY MOUTH DAILY. 270 capsule 0  . ibuprofen (ADVIL) 600 MG tablet Take 1 tablet (600 mg total) by mouth every 8 (eight) hours as needed for moderate pain. For AFTER surgery only. Use sparingly as needed. (Patient not taking: Reported on 04/27/2020) 30 tablet 0  . ibuprofen (ADVIL,MOTRIN) 200 MG tablet Take 200 mg by mouth every 6 (six) hours as needed. (Patient not taking: Reported on 04/13/2020)    . LORazepam (ATIVAN) 0.5 MG tablet Take 0.5 mg by mouth every 8 (eight) hours as needed for anxiety.    Marland Kitchen losartan (COZAAR) 100 MG tablet Take 1 tablet (100 mg total) by mouth daily. 30 tablet 0  . Multiple Vitamin (THERA) TABS Take 1 tablet by mouth daily. (Patient not taking: Reported on 04/22/2020)    . neomycin (MYCIFRADIN) 500 MG tablet Take 2 tablets at 2 pm, 3 pm, and 10 pm the day before surgery (Patient not taking: Reported on 04/27/2020) 6 tablet 0  . ondansetron (ZOFRAN ODT) 4 MG disintegrating tablet Take 1 tablet (4 mg total) by mouth every 4 (four) hours as needed for nausea or vomiting. (Patient not taking:  Reported on 04/27/2020) 20 tablet 0  . oxyCODONE (OXY IR/ROXICODONE) 5 MG immediate release tablet Take 1 tablet (5 mg total) by mouth every 4 (four) hours as needed for severe pain. For AFTER surgery only, do not take and drive (Patient not taking: Reported on 04/27/2020) 20 tablet 0  . oxyCODONE-acetaminophen (PERCOCET) 5-325 MG tablet Take 1 tablet by mouth every 6 (six) hours as needed. (Patient not taking: Reported on 04/13/2020) 20 tablet 0  . senna (SENOKOT)  8.6 MG TABS tablet Take 1 tablet (8.6 mg total) by mouth at bedtime. (Patient not taking: Reported on 04/13/2020) 120 tablet 0  . senna-docusate (SENOKOT-S) 8.6-50 MG tablet Take 2 tablets by mouth at bedtime. For AFTER surgery, do not take if having diarrhea (Patient not taking: Reported on 04/13/2020) 30 tablet 0   No current facility-administered medications on file prior to visit.    BP 125/66   Pulse 87   Resp 18   Ht 5\' 2"  (1.575 m)   Wt 208 lb 6.4 oz (94.5 kg)   LMP 04/13/2020   SpO2 98%   BMI 38.12 kg/m       Objective:   Physical Exam  General Mental Status- Alert. General Appearance- Not in acute distress.   Skin General: Color- Normal Color. Moisture- Normal Moisture.  Neck Appears to have some degree of enlarged thyroid US.  Chest and Lung Exam Auscultation: Breath Sounds:-Normal.  Cardiovascular Auscultation:Rythm- Regular. Murmurs & Other Heart Sounds:Auscultation of the heart reveals- No Murmurs.  Neurologic Cranial Nerve exam:- CN III-XII intact(No nystagmus), symmetric smile. Strength:- 5/5 equal and symmetric strength both upper and lower extremities.      Assessment & Plan:  Your blood pressure is well controlled today.  Continue with losartan, chlorthalidone and amlodipine.  History of depression and anxiety.  Overall depression controlled with Prozac.  You do report some degree of anxiety since losing job.  I did offer you brief low number prescription of Ativan but that was declined  today.  There are other options such as BuSpar if your anxiety worsens.  Palpitations recently very brief about once a day lasting for 1 minute or less.  Last EKG 1 week ago was normal.  Anxiety might be playing a role and might be beneficial to use medication such as Ativan to see if palpitations resolve completely.  Also continue to avoid caffeine beverages.  If palpitations continue to recur would refer to cardiologist.  Recommend that you wear your smart watch and note pulse readings when having palpitations.  For your recent fatigue and appearance of thyromegaly on exam will get TSH, T4 and ultrasound thyroid/neck.  Follow-up date to be determined after lab review.

## 2020-05-05 ENCOUNTER — Ambulatory Visit (HOSPITAL_BASED_OUTPATIENT_CLINIC_OR_DEPARTMENT_OTHER)
Admission: RE | Admit: 2020-05-05 | Discharge: 2020-05-05 | Disposition: A | Discharge status: Home / Self Care | Payer: Self-pay | Source: Ambulatory Visit | Attending: Medical | Admitting: Medical

## 2020-05-05 DIAGNOSIS — E010 Iodine-deficiency related diffuse (endemic) goiter: Secondary | ICD-10-CM | POA: Insufficient documentation

## 2020-05-05 LAB — TSH: TSH: 0.71 u[IU]/mL (ref 0.35–4.50)

## 2020-05-05 LAB — T4, FREE: Free T4: 0.71 ng/dL (ref 0.60–1.60)

## 2020-05-06 ENCOUNTER — Telehealth: Payer: Self-pay

## 2020-05-06 NOTE — Anesthesia Preprocedure Evaluation (Addendum)
Anesthesia Evaluation  Patient identified by MRN, date of birth, ID band Patient awake    Reviewed: Allergy & Precautions, NPO status , Patient's Chart, lab work & pertinent test results  Airway Mallampati: II  TM Distance: >3 FB Neck ROM: Full    Dental no notable dental hx. (+) Teeth Intact, Dental Advisory Given   Pulmonary neg pulmonary ROS,    Pulmonary exam normal breath sounds clear to auscultation       Cardiovascular hypertension, Pt. on medications Normal cardiovascular exam Rhythm:Regular Rate:Normal     Neuro/Psych  Headaches, PSYCHIATRIC DISORDERS Depression    GI/Hepatic negative GI ROS, Neg liver ROS,   Endo/Other  negative endocrine ROS  Renal/GU K+ 3.3 Cr 0.83     Musculoskeletal negative musculoskeletal ROS (+)   Abdominal (+) + obese,   Peds  Hematology  (+) anemia , Lab Results      Component                Value               Date                      WBC                      4.9                 04/28/2020                HGB                      11.4 (L)            04/28/2020                HCT                      37.4                04/28/2020                MCV                      80.6                04/28/2020                PLT                      418 (H)             04/28/2020              Anesthesia Other Findings ALL : latex  Reproductive/Obstetrics                            Anesthesia Physical Anesthesia Plan  ASA: III  Anesthesia Plan: General   Post-op Pain Management:    Induction: Intravenous  PONV Risk Score and Plan: 4 or greater and Treatment may vary due to age or medical condition, Ondansetron, Dexamethasone, Midazolam and Scopolamine patch - Pre-op  Airway Management Planned: Oral ETT  Additional Equipment: None  Intra-op Plan:   Post-operative Plan: Extubation in OR  Informed Consent: I have reviewed the patients History and  Physical, chart, labs and discussed the procedure including the risks, benefits and alternatives for  the proposed anesthesia with the patient or authorized representative who has indicated his/her understanding and acceptance.     Dental advisory given  Plan Discussed with: CRNA and Anesthesiologist  Anesthesia Plan Comments:        Anesthesia Quick Evaluation

## 2020-05-06 NOTE — Telephone Encounter (Signed)
Traci Frank did not know when to begin taking the Mag citrate today. Told her it was 12 pm. It is ~1430 now.  She does not have the bowel prep sheet given to her on 03-30-20.  Reviewed information as noted on 03-30-20 AVS.  Traci Frank remembered most of it and has begun ATB as they had times noted in directions. Reviewed  With Dr. Denman George about delay in Mag citrate. Dr. Denman George said to begin asap and continue with prep and ATB as directed.  Pt has presurgical testing written sheets. Pt now comfortable with directions and will proceed with pre op instructions as documented in chart notes and relayed to patient.

## 2020-05-07 ENCOUNTER — Encounter (HOSPITAL_COMMUNITY)
Admission: RE | Disposition: A | Discharge status: Home / Self Care | Payer: Self-pay | Source: Home / Self Care | Attending: Gynecologic Oncology

## 2020-05-07 ENCOUNTER — Encounter (HOSPITAL_COMMUNITY): Payer: Self-pay | Admitting: Gynecologic Oncology

## 2020-05-07 ENCOUNTER — Other Ambulatory Visit: Payer: Self-pay

## 2020-05-07 ENCOUNTER — Inpatient Hospital Stay (HOSPITAL_COMMUNITY): Payer: Self-pay | Admitting: Anesthesiology

## 2020-05-07 ENCOUNTER — Inpatient Hospital Stay (HOSPITAL_COMMUNITY)
Admission: RE | Admit: 2020-05-07 | Discharge: 2020-05-09 | DRG: 358 | Disposition: A | Discharge status: Home / Self Care | Payer: Self-pay | Attending: Gynecologic Oncology | Admitting: Gynecologic Oncology

## 2020-05-07 DIAGNOSIS — Z823 Family history of stroke: Secondary | ICD-10-CM

## 2020-05-07 DIAGNOSIS — D201 Benign neoplasm of soft tissue of peritoneum: Secondary | ICD-10-CM | POA: Diagnosis present

## 2020-05-07 DIAGNOSIS — Z833 Family history of diabetes mellitus: Secondary | ICD-10-CM

## 2020-05-07 DIAGNOSIS — Z6837 Body mass index (BMI) 37.0-37.9, adult: Secondary | ICD-10-CM

## 2020-05-07 DIAGNOSIS — D4819 Other specified neoplasm of uncertain behavior of connective and other soft tissue: Secondary | ICD-10-CM | POA: Diagnosis present

## 2020-05-07 DIAGNOSIS — D481 Neoplasm of uncertain behavior of connective and other soft tissue: Secondary | ICD-10-CM

## 2020-05-07 DIAGNOSIS — Z91040 Latex allergy status: Secondary | ICD-10-CM

## 2020-05-07 DIAGNOSIS — N189 Chronic kidney disease, unspecified: Secondary | ICD-10-CM | POA: Diagnosis present

## 2020-05-07 DIAGNOSIS — Z8249 Family history of ischemic heart disease and other diseases of the circulatory system: Secondary | ICD-10-CM

## 2020-05-07 DIAGNOSIS — Z79899 Other long term (current) drug therapy: Secondary | ICD-10-CM

## 2020-05-07 DIAGNOSIS — D259 Leiomyoma of uterus, unspecified: Secondary | ICD-10-CM

## 2020-05-07 DIAGNOSIS — I129 Hypertensive chronic kidney disease with stage 1 through stage 4 chronic kidney disease, or unspecified chronic kidney disease: Secondary | ICD-10-CM | POA: Diagnosis present

## 2020-05-07 DIAGNOSIS — E669 Obesity, unspecified: Secondary | ICD-10-CM | POA: Diagnosis present

## 2020-05-07 DIAGNOSIS — R1900 Intra-abdominal and pelvic swelling, mass and lump, unspecified site: Secondary | ICD-10-CM | POA: Diagnosis present

## 2020-05-07 DIAGNOSIS — Z560 Unemployment, unspecified: Secondary | ICD-10-CM

## 2020-05-07 DIAGNOSIS — D484 Neoplasm of uncertain behavior of peritoneum: Principal | ICD-10-CM | POA: Diagnosis present

## 2020-05-07 DIAGNOSIS — D500 Iron deficiency anemia secondary to blood loss (chronic): Secondary | ICD-10-CM | POA: Diagnosis present

## 2020-05-07 HISTORY — PX: OMENTECTOMY: SHX5985

## 2020-05-07 HISTORY — PX: TOTAL ABDOMINAL HYSTERECTOMY: SHX209

## 2020-05-07 HISTORY — PX: HYSTERECTOMY ABDOMINAL WITH SALPINGECTOMY: SHX6725

## 2020-05-07 LAB — PREGNANCY, URINE: Preg Test, Ur: NEGATIVE

## 2020-05-07 LAB — PREPARE RBC (CROSSMATCH)

## 2020-05-07 LAB — ABO/RH: ABO/RH(D): B POS

## 2020-05-07 SURGERY — HYSTERECTOMY, TOTAL, ABDOMINAL, WITH SALPINGECTOMY
Anesthesia: General

## 2020-05-07 MED ORDER — CHLORHEXIDINE GLUCONATE 0.12 % MT SOLN: 15.0000 mL | Freq: Once | OROMUCOSAL | Status: DC

## 2020-05-07 MED ORDER — KETAMINE HCL 10 MG/ML IJ SOLN
INTRAMUSCULAR | Status: AC
  Filled 2020-05-07: qty 1

## 2020-05-07 MED ORDER — HYDROMORPHONE HCL 1 MG/ML IJ SOLN
INTRAMUSCULAR | Status: AC
  Filled 2020-05-07: qty 1

## 2020-05-07 MED ORDER — LACTATED RINGERS IV SOLN: INTRAVENOUS | Status: DC | PRN

## 2020-05-07 MED ORDER — HYDROMORPHONE HCL 1 MG/ML IJ SOLN
0.5000 mg | INTRAMUSCULAR | Status: DC | PRN
  Administered 2020-05-08: 0.5 mg via INTRAVENOUS
  Filled 2020-05-07: qty 0.5

## 2020-05-07 MED ORDER — KCL IN DEXTROSE-NACL 20-5-0.45 MEQ/L-%-% IV SOLN
INTRAVENOUS | Status: DC
  Filled 2020-05-07 (×3): qty 1000

## 2020-05-07 MED ORDER — DEXAMETHASONE SODIUM PHOSPHATE 4 MG/ML IJ SOLN: 4.0000 mg | INTRAMUSCULAR | Status: DC

## 2020-05-07 MED ORDER — PROPOFOL 10 MG/ML IV BOLUS
INTRAVENOUS | Status: AC
  Filled 2020-05-07: qty 20

## 2020-05-07 MED ORDER — DEXAMETHASONE SODIUM PHOSPHATE 10 MG/ML IJ SOLN
INTRAMUSCULAR | Status: DC | PRN
  Administered 2020-05-07: 10 mg via INTRAVENOUS

## 2020-05-07 MED ORDER — ROCURONIUM BROMIDE 10 MG/ML (PF) SYRINGE
PREFILLED_SYRINGE | INTRAVENOUS | Status: AC
  Filled 2020-05-07: qty 10

## 2020-05-07 MED ORDER — BUPIVACAINE HCL 0.25 % IJ SOLN
INTRAMUSCULAR | Status: AC
  Filled 2020-05-07: qty 1

## 2020-05-07 MED ORDER — SODIUM CHLORIDE (PF) 0.9 % IJ SOLN
INTRAMUSCULAR | Status: DC | PRN
  Administered 2020-05-07: 20 mL

## 2020-05-07 MED ORDER — EPHEDRINE SULFATE-NACL 50-0.9 MG/10ML-% IV SOSY
PREFILLED_SYRINGE | INTRAVENOUS | Status: DC | PRN
  Administered 2020-05-07: 10 mg via INTRAVENOUS

## 2020-05-07 MED ORDER — PROPOFOL 10 MG/ML IV BOLUS
INTRAVENOUS | Status: DC | PRN
  Administered 2020-05-07: 180 mg via INTRAVENOUS

## 2020-05-07 MED ORDER — ACETAMINOPHEN 500 MG PO TABS
1000.0000 mg | ORAL_TABLET | Freq: Four times a day (QID) | ORAL | Status: DC
  Administered 2020-05-07 – 2020-05-09 (×7): 1000 mg via ORAL
  Filled 2020-05-07 (×7): qty 2

## 2020-05-07 MED ORDER — MIDAZOLAM HCL 2 MG/2ML IJ SOLN
INTRAMUSCULAR | Status: DC | PRN
  Administered 2020-05-07: 2 mg via INTRAVENOUS

## 2020-05-07 MED ORDER — ENSURE PRE-SURGERY PO LIQD
296.0000 mL | Freq: Once | ORAL | Status: DC
  Filled 2020-05-07: qty 296

## 2020-05-07 MED ORDER — LIDOCAINE 2% (20 MG/ML) 5 ML SYRINGE
INTRAMUSCULAR | Status: DC | PRN
  Administered 2020-05-07: 1.5 mg/kg/h via INTRAVENOUS

## 2020-05-07 MED ORDER — LIDOCAINE HCL (PF) 2 % IJ SOLN
INTRAMUSCULAR | Status: AC
  Filled 2020-05-07: qty 5

## 2020-05-07 MED ORDER — OXYCODONE HCL 5 MG/5ML PO SOLN: 5.0000 mg | Freq: Once | ORAL | Status: DC | PRN

## 2020-05-07 MED ORDER — KETAMINE HCL 10 MG/ML IJ SOLN
INTRAMUSCULAR | Status: DC | PRN
  Administered 2020-05-07: 20 mg via INTRAVENOUS
  Administered 2020-05-07 (×3): 10 mg via INTRAVENOUS

## 2020-05-07 MED ORDER — BUPIVACAINE LIPOSOME 1.3 % IJ SUSP
20.0000 mL | Freq: Once | INTRAMUSCULAR | Status: AC
  Administered 2020-05-07: 20 mL
  Filled 2020-05-07: qty 20

## 2020-05-07 MED ORDER — LIDOCAINE HCL (PF) 2 % IJ SOLN
INTRAMUSCULAR | Status: AC
  Filled 2020-05-07: qty 10

## 2020-05-07 MED ORDER — TRAMADOL HCL 50 MG PO TABS
100.0000 mg | ORAL_TABLET | Freq: Four times a day (QID) | ORAL | Status: DC
  Administered 2020-05-07 – 2020-05-09 (×7): 100 mg via ORAL
  Filled 2020-05-07 (×7): qty 2

## 2020-05-07 MED ORDER — SUGAMMADEX SODIUM 200 MG/2ML IV SOLN
INTRAVENOUS | Status: DC | PRN
  Administered 2020-05-07: 200 mg via INTRAVENOUS

## 2020-05-07 MED ORDER — OXYCODONE HCL 5 MG PO TABS: 5.0000 mg | ORAL_TABLET | Freq: Once | ORAL | Status: DC | PRN

## 2020-05-07 MED ORDER — LORAZEPAM 0.5 MG PO TABS: 0.5000 mg | ORAL_TABLET | Freq: Three times a day (TID) | ORAL | Status: DC | PRN

## 2020-05-07 MED ORDER — CELECOXIB 200 MG PO CAPS
400.0000 mg | ORAL_CAPSULE | ORAL | Status: AC
  Administered 2020-05-07: 400 mg via ORAL
  Filled 2020-05-07: qty 2

## 2020-05-07 MED ORDER — PHENYLEPHRINE 40 MCG/ML (10ML) SYRINGE FOR IV PUSH (FOR BLOOD PRESSURE SUPPORT)
PREFILLED_SYRINGE | INTRAVENOUS | Status: AC
  Filled 2020-05-07: qty 10

## 2020-05-07 MED ORDER — ONDANSETRON HCL 4 MG/2ML IJ SOLN
INTRAMUSCULAR | Status: DC | PRN
  Administered 2020-05-07: 4 mg via INTRAVENOUS

## 2020-05-07 MED ORDER — MIDAZOLAM HCL 2 MG/2ML IJ SOLN
INTRAMUSCULAR | Status: AC
  Filled 2020-05-07: qty 2

## 2020-05-07 MED ORDER — DEXAMETHASONE SODIUM PHOSPHATE 10 MG/ML IJ SOLN
INTRAMUSCULAR | Status: AC
  Filled 2020-05-07: qty 1

## 2020-05-07 MED ORDER — SODIUM CHLORIDE (PF) 0.9 % IJ SOLN
INTRAMUSCULAR | Status: AC
  Filled 2020-05-07: qty 50

## 2020-05-07 MED ORDER — GABAPENTIN 300 MG PO CAPS
300.0000 mg | ORAL_CAPSULE | ORAL | Status: AC
  Administered 2020-05-07: 300 mg via ORAL
  Filled 2020-05-07: qty 1

## 2020-05-07 MED ORDER — ENSURE PRE-SURGERY PO LIQD
592.0000 mL | Freq: Once | ORAL | Status: DC
  Filled 2020-05-07: qty 592

## 2020-05-07 MED ORDER — ENOXAPARIN SODIUM 40 MG/0.4ML ~~LOC~~ SOLN
40.0000 mg | SUBCUTANEOUS | Status: AC
  Administered 2020-05-07: 40 mg via SUBCUTANEOUS
  Filled 2020-05-07: qty 0.4

## 2020-05-07 MED ORDER — KETOROLAC TROMETHAMINE 30 MG/ML IJ SOLN
30.0000 mg | Freq: Once | INTRAMUSCULAR | Status: AC | PRN
  Administered 2020-05-07: 30 mg via INTRAVENOUS

## 2020-05-07 MED ORDER — ENSURE ENLIVE PO LIQD
237.0000 mL | Freq: Two times a day (BID) | ORAL | Status: DC
  Administered 2020-05-07 – 2020-05-09 (×4): 237 mL via ORAL

## 2020-05-07 MED ORDER — FENTANYL CITRATE (PF) 100 MCG/2ML IJ SOLN
INTRAMUSCULAR | Status: AC
  Filled 2020-05-07: qty 2

## 2020-05-07 MED ORDER — SENNOSIDES-DOCUSATE SODIUM 8.6-50 MG PO TABS
2.0000 | ORAL_TABLET | Freq: Every day | ORAL | Status: DC
  Administered 2020-05-07 – 2020-05-08 (×2): 2 via ORAL
  Filled 2020-05-07 (×2): qty 2

## 2020-05-07 MED ORDER — OXYCODONE HCL 5 MG PO TABS
5.0000 mg | ORAL_TABLET | ORAL | Status: DC | PRN
  Administered 2020-05-07: 5 mg via ORAL
  Filled 2020-05-07: qty 1

## 2020-05-07 MED ORDER — SODIUM CHLORIDE 0.9 % IV SOLN
2.0000 g | Freq: Once | INTRAVENOUS | Status: DC
  Filled 2020-05-07: qty 2

## 2020-05-07 MED ORDER — FENTANYL CITRATE (PF) 250 MCG/5ML IJ SOLN
INTRAMUSCULAR | Status: AC
  Filled 2020-05-07: qty 5

## 2020-05-07 MED ORDER — BUPIVACAINE HCL 0.25 % IJ SOLN
INTRAMUSCULAR | Status: DC | PRN
  Administered 2020-05-07: 20 mL

## 2020-05-07 MED ORDER — FLUOXETINE HCL 20 MG PO CAPS
30.0000 mg | ORAL_CAPSULE | Freq: Every day | ORAL | Status: DC
  Administered 2020-05-08 – 2020-05-09 (×2): 30 mg via ORAL
  Filled 2020-05-07 (×2): qty 1

## 2020-05-07 MED ORDER — ACETAMINOPHEN 500 MG PO TABS
1000.0000 mg | ORAL_TABLET | ORAL | Status: AC
  Administered 2020-05-07: 1000 mg via ORAL
  Filled 2020-05-07: qty 2

## 2020-05-07 MED ORDER — GABAPENTIN 300 MG PO CAPS
300.0000 mg | ORAL_CAPSULE | Freq: Three times a day (TID) | ORAL | Status: DC
  Administered 2020-05-07 – 2020-05-09 (×6): 300 mg via ORAL
  Filled 2020-05-07 (×6): qty 1

## 2020-05-07 MED ORDER — ONDANSETRON HCL 4 MG PO TABS: 4.0000 mg | ORAL_TABLET | Freq: Four times a day (QID) | ORAL | Status: DC | PRN

## 2020-05-07 MED ORDER — ROCURONIUM BROMIDE 10 MG/ML (PF) SYRINGE
PREFILLED_SYRINGE | INTRAVENOUS | Status: DC | PRN
  Administered 2020-05-07: 20 mg via INTRAVENOUS
  Administered 2020-05-07: 55 mg via INTRAVENOUS
  Administered 2020-05-07: 25 mg via INTRAVENOUS

## 2020-05-07 MED ORDER — KETOROLAC TROMETHAMINE 30 MG/ML IJ SOLN
INTRAMUSCULAR | Status: AC
  Filled 2020-05-07: qty 1

## 2020-05-07 MED ORDER — SCOPOLAMINE 1 MG/3DAYS TD PT72
1.0000 | MEDICATED_PATCH | TRANSDERMAL | Status: DC
  Administered 2020-05-07: 1.5 mg via TRANSDERMAL
  Filled 2020-05-07: qty 1

## 2020-05-07 MED ORDER — HYDROMORPHONE HCL 1 MG/ML IJ SOLN
0.2500 mg | INTRAMUSCULAR | Status: DC | PRN
  Administered 2020-05-07 (×3): 0.5 mg via INTRAVENOUS

## 2020-05-07 MED ORDER — PHENYLEPHRINE 40 MCG/ML (10ML) SYRINGE FOR IV PUSH (FOR BLOOD PRESSURE SUPPORT)
PREFILLED_SYRINGE | INTRAVENOUS | Status: DC | PRN
  Administered 2020-05-07: 120 ug via INTRAVENOUS
  Administered 2020-05-07 (×2): 80 ug via INTRAVENOUS

## 2020-05-07 MED ORDER — ONDANSETRON HCL 4 MG/2ML IJ SOLN: 4.0000 mg | Freq: Four times a day (QID) | INTRAMUSCULAR | Status: DC | PRN

## 2020-05-07 MED ORDER — CHEWING GUM (ORBIT) SUGAR FREE
1.0000 | CHEWING_GUM | Freq: Three times a day (TID) | ORAL | Status: DC
  Administered 2020-05-08 – 2020-05-09 (×5): 1 via ORAL
  Filled 2020-05-07: qty 1

## 2020-05-07 MED ORDER — ENOXAPARIN SODIUM 40 MG/0.4ML ~~LOC~~ SOLN
40.0000 mg | SUBCUTANEOUS | Status: DC
  Administered 2020-05-08 – 2020-05-09 (×2): 40 mg via SUBCUTANEOUS
  Filled 2020-05-07 (×2): qty 0.4

## 2020-05-07 MED ORDER — NON FORMULARY: 1.0000 [IU] | Freq: Three times a day (TID) | Status: DC

## 2020-05-07 MED ORDER — EPHEDRINE 5 MG/ML INJ
INTRAVENOUS | Status: AC
  Filled 2020-05-07: qty 10

## 2020-05-07 MED ORDER — LIDOCAINE 2% (20 MG/ML) 5 ML SYRINGE
INTRAMUSCULAR | Status: DC | PRN
  Administered 2020-05-07: 100 mg via INTRAVENOUS

## 2020-05-07 MED ORDER — AMISULPRIDE (ANTIEMETIC) 5 MG/2ML IV SOLN: 10.0000 mg | Freq: Once | INTRAVENOUS | Status: DC | PRN

## 2020-05-07 MED ORDER — IBUPROFEN 200 MG PO TABS
600.0000 mg | ORAL_TABLET | Freq: Four times a day (QID) | ORAL | Status: DC
  Administered 2020-05-08 – 2020-05-09 (×5): 600 mg via ORAL
  Filled 2020-05-07 (×5): qty 3

## 2020-05-07 MED ORDER — SODIUM CHLORIDE 0.9 % IV SOLN
2.0000 g | INTRAVENOUS | Status: AC
  Administered 2020-05-07 (×2): 2 g via INTRAVENOUS
  Filled 2020-05-07 (×2): qty 2

## 2020-05-07 MED ORDER — AMLODIPINE BESYLATE 5 MG PO TABS
5.0000 mg | ORAL_TABLET | Freq: Every day | ORAL | Status: DC
  Administered 2020-05-08 – 2020-05-09 (×2): 5 mg via ORAL
  Filled 2020-05-07 (×2): qty 1

## 2020-05-07 MED ORDER — 0.9 % SODIUM CHLORIDE (POUR BTL) OPTIME
TOPICAL | Status: DC | PRN
  Administered 2020-05-07: 3000 mL

## 2020-05-07 MED ORDER — FENTANYL CITRATE (PF) 250 MCG/5ML IJ SOLN
INTRAMUSCULAR | Status: DC | PRN
  Administered 2020-05-07: 25 ug via INTRAVENOUS
  Administered 2020-05-07 (×2): 50 ug via INTRAVENOUS
  Administered 2020-05-07: 100 ug via INTRAVENOUS
  Administered 2020-05-07 (×2): 50 ug via INTRAVENOUS
  Administered 2020-05-07: 25 ug via INTRAVENOUS

## 2020-05-07 MED ORDER — ORAL CARE MOUTH RINSE: 15.0000 mL | Freq: Once | OROMUCOSAL | Status: DC

## 2020-05-07 MED ORDER — ONDANSETRON HCL 4 MG/2ML IJ SOLN: 4.0000 mg | Freq: Once | INTRAMUSCULAR | Status: DC | PRN

## 2020-05-07 MED ORDER — LACTATED RINGERS IV SOLN: INTRAVENOUS | Status: DC

## 2020-05-07 MED ORDER — ONDANSETRON HCL 4 MG/2ML IJ SOLN
INTRAMUSCULAR | Status: AC
  Filled 2020-05-07: qty 2

## 2020-05-07 SURGICAL SUPPLY — 75 items
ADH SKN CLS APL DERMABOND .7 (GAUZE/BANDAGES/DRESSINGS) ×2
AGENT HMST KT MTR STRL THRMB (HEMOSTASIS)
APL PRP STRL LF DISP 70% ISPRP (MISCELLANEOUS) ×2
ATTRACTOMAT 16X20 MAGNETIC DRP (DRAPES) ×1 IMPLANT
BACTOSHIELD CHG 4% 4OZ (MISCELLANEOUS) ×1
BLADE EXTENDED COATED 6.5IN (ELECTRODE) ×3 IMPLANT
CELLS DAT CNTRL 66122 CELL SVR (MISCELLANEOUS) IMPLANT
CHLORAPREP W/TINT 26 (MISCELLANEOUS) ×3 IMPLANT
CLIP VESOCCLUDE LG 6/CT (CLIP) ×3 IMPLANT
CLIP VESOCCLUDE MED 6/CT (CLIP) ×3 IMPLANT
CLIP VESOCCLUDE MED LG 6/CT (CLIP) ×4 IMPLANT
CNTNR URN SCR LID CUP LEK RST (MISCELLANEOUS) IMPLANT
CONT SPEC 4OZ STRL OR WHT (MISCELLANEOUS) ×3
COVER WAND RF STERILE (DRAPES) IMPLANT
DERMABOND ADVANCED (GAUZE/BANDAGES/DRESSINGS) ×1
DERMABOND ADVANCED .7 DNX12 (GAUZE/BANDAGES/DRESSINGS) IMPLANT
DRAPE INCISE IOBAN 66X45 STRL (DRAPES) ×1 IMPLANT
DRAPE SURG IRRIG POUCH 19X23 (DRAPES) ×3 IMPLANT
DRAPE WARM FLUID 44X44 (DRAPES) ×3 IMPLANT
DRSG OPSITE POSTOP 4X10 (GAUZE/BANDAGES/DRESSINGS) ×1 IMPLANT
DRSG OPSITE POSTOP 4X12 (GAUZE/BANDAGES/DRESSINGS) ×1 IMPLANT
DRSG OPSITE POSTOP 4X6 (GAUZE/BANDAGES/DRESSINGS) IMPLANT
DRSG OPSITE POSTOP 4X8 (GAUZE/BANDAGES/DRESSINGS) IMPLANT
ELECT REM PT RETURN 15FT ADLT (MISCELLANEOUS) ×3 IMPLANT
GAUZE 4X4 16PLY RFD (DISPOSABLE) ×1 IMPLANT
GLOVE SURG SS PI 6.0 STRL IVOR (GLOVE) ×3 IMPLANT
GLOVE SURG SS PI 6.5 STRL IVOR (GLOVE) ×4 IMPLANT
GOWN STRL REUS W/ TWL LRG LVL3 (GOWN DISPOSABLE) ×4 IMPLANT
GOWN STRL REUS W/TWL LRG LVL3 (GOWN DISPOSABLE) ×6
HEMOSTAT ARISTA ABSORB 3G PWDR (HEMOSTASIS) IMPLANT
KIT BASIN OR (CUSTOM PROCEDURE TRAY) ×3 IMPLANT
KIT TURNOVER KIT A (KITS) IMPLANT
LIGASURE IMPACT 36 18CM CVD LR (INSTRUMENTS) ×1 IMPLANT
LOOP VESSEL MAXI BLUE (MISCELLANEOUS) IMPLANT
NEEDLE HYPO 22GX1.5 SAFETY (NEEDLE) ×6 IMPLANT
NS IRRIG 1000ML POUR BTL (IV SOLUTION) ×6 IMPLANT
PACK GENERAL/GYN (CUSTOM PROCEDURE TRAY) ×3 IMPLANT
RELOAD PROXIMATE 75MM BLUE (ENDOMECHANICALS) IMPLANT
RELOAD PROXIMATE TA60MM BLUE (ENDOMECHANICALS) IMPLANT
RELOAD STAPLE 60 BLU REG PROX (ENDOMECHANICALS) IMPLANT
RELOAD STAPLE 75 3.8 BLU REG (ENDOMECHANICALS) IMPLANT
RETRACTOR WND ALEXIS 18 MED (MISCELLANEOUS) IMPLANT
RETRACTOR WND ALEXIS 25 LRG (MISCELLANEOUS) IMPLANT
RTRCTR WOUND ALEXIS 18CM MED (MISCELLANEOUS)
RTRCTR WOUND ALEXIS 25CM LRG (MISCELLANEOUS)
SCRUB CHG 4% DYNA-HEX 4OZ (MISCELLANEOUS) ×4 IMPLANT
SHEET LAVH (DRAPES) ×3 IMPLANT
SLEEVE SUCTION CATH 165 (SLEEVE) ×2 IMPLANT
SPONGE LAP 18X18 RF (DISPOSABLE) ×1 IMPLANT
STAPLER GUN LINEAR PROX 60 (STAPLE) IMPLANT
STAPLER PROXIMATE 75MM BLUE (STAPLE) IMPLANT
STAPLER VISISTAT 35W (STAPLE) IMPLANT
SURGIFLO W/THROMBIN 8M KIT (HEMOSTASIS) IMPLANT
SUT MNCRL AB 4-0 PS2 18 (SUTURE) ×6 IMPLANT
SUT PDS AB 1 TP1 96 (SUTURE) ×6 IMPLANT
SUT SILK 3 0 SH CR/8 (SUTURE) IMPLANT
SUT VIC AB 0 CT1 36 (SUTURE) ×12 IMPLANT
SUT VIC AB 2-0 CT1 36 (SUTURE) ×6 IMPLANT
SUT VIC AB 2-0 CT2 27 (SUTURE) ×22 IMPLANT
SUT VIC AB 2-0 SH 27 (SUTURE) ×3
SUT VIC AB 2-0 SH 27X BRD (SUTURE) IMPLANT
SUT VIC AB 3-0 CT1 27 (SUTURE)
SUT VIC AB 3-0 CT1 TAPERPNT 27 (SUTURE) IMPLANT
SUT VIC AB 3-0 CTX 36 (SUTURE) ×2 IMPLANT
SUT VIC AB 3-0 SH 18 (SUTURE) IMPLANT
SUT VIC AB 3-0 SH 27 (SUTURE) ×15
SUT VIC AB 3-0 SH 27X BRD (SUTURE) ×2 IMPLANT
SUT VIC AB 4-0 PS2 18 (SUTURE) ×1 IMPLANT
SUT VIC AB 4-0 PS2 27 (SUTURE) ×3 IMPLANT
SYR 30ML LL (SYRINGE) ×6 IMPLANT
TOWEL OR 17X26 10 PK STRL BLUE (TOWEL DISPOSABLE) ×3 IMPLANT
TOWEL OR NON WOVEN STRL DISP B (DISPOSABLE) ×3 IMPLANT
TRAY FOL W/BAG SLVR 16FR STRL (SET/KITS/TRAYS/PACK) IMPLANT
TRAY FOLEY W/BAG SLVR 16FR LF (SET/KITS/TRAYS/PACK) ×3
UNDERPAD 30X36 HEAVY ABSORB (UNDERPADS AND DIAPERS) ×3 IMPLANT

## 2020-05-07 NOTE — Anesthesia Procedure Notes (Signed)
Procedure Name: Intubation Date/Time: 05/07/2020 7:46 AM Performed by: Sharlette Dense, CRNA Patient Re-evaluated:Patient Re-evaluated prior to induction Oxygen Delivery Method: Circle system utilized Preoxygenation: Pre-oxygenation with 100% oxygen Induction Type: IV induction Ventilation: Mask ventilation without difficulty and Oral airway inserted - appropriate to patient size Laryngoscope Size: [name] Heck and 2 Grade View: Grade I Tube type: Oral Tube size: 7.5 mm Number of attempts: 1 Airway Equipment and Method: Stylet Placement Confirmation: ETT inserted through vocal cords under direct vision,  positive ETCO2 and breath sounds checked- equal and bilateral Secured at: 21 cm Tube secured with: Tape Dental Injury: Teeth and Oropharynx as per pre-operative assessment

## 2020-05-07 NOTE — Addendum Note (Signed)
Addendum  created 05/07/20 1354 by Barnet Glasgow, MD   Clinical Note Signed

## 2020-05-07 NOTE — Anesthesia Postprocedure Evaluation (Signed)
Anesthesia Post Note  Patient: Traci Frank  Procedure(s) Performed: EXPLORATORY LAPAROTOMY,TOTAL HYSTERECTOMY ABDOMINAL WITH BILATERAL  SALPINGECTOMY GREATER THAN 250GM (Bilateral ) OMENTECTOMY, RADICAL TUMOR DEBULKING (N/A )     Anesthesia Post Evaluation No complications documented.  Last Vitals:  Vitals:   05/07/20 1215 05/07/20 1248  BP: 137/86 137/75  Pulse: 74 84  Resp: 12 20  Temp: 36.8 C 36.8 C  SpO2: 95% 100%    Last Pain:  Vitals:   05/07/20 1341  TempSrc:   PainSc: Earle

## 2020-05-07 NOTE — H&P (Signed)
H&P Note: Gyn-Onc  CC:  Leiomyomatosis peritoneii  Assessment/Plan:  [name]  is a 39 y.o.  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   She is symptomatic with mass effect symptoms. Her scans suggest leiomyomata in the omentum and mesentery with close approximation to the transverse colon.   I counseled the patient regarding the disease appearance on imaging. We reviewed her scan images together. I explained that I had a low suspicion that this was malignancy, but rather more likely disseminated leiomyoma mitosis from her prior morcellated hysterectomy.  I explained that I'm unclear based on the imaging if these fibroid tumors in her upper abdomen are densely adherent to the bowel wall, though certainly they could be. Therefore surgery potentially could involve bowel resection.  I counseled her and recommended exploratory laparotomy with total abdominal hysterectomy, bilateral salpingectomy, omentectomy, radical tumor debulking of the leiomyomas. I explained that this could be associated with significant blood loss and she will require a type and cross for 2 units. Her Hb has improved preop with iron replacement therapy.   Due to the potential close proximity with bowel and possible need for a large bowel resection of small bowel resection, she will receive a bowel prep with antibiotics preoperatively and cefoxitin intraoperatively for prophylaxis.  I counseled her about surgical risks including  bleeding, infection, damage to internal organs (such as bladder,ureters, bowels), blood clot, reoperation and rehospitalization. I counseled her that she was at increased risk for these given her obesity, hypertension, and the bulky large nature of her fibroid disease.  She will require blood pressure optimization preoperatively. She has not seen a primary care doctor for this for many years. Her blood pressure is significantly elevated today. She has  evidence of chronic kidney disease likely secondary to uncontrolled hypertension.  She will receive Lovenox for 1 month postoperatively for DVT prophylaxis.  She was informed that hysterectomy would result in permanent infertility, or about which she is excepting and in agreement.   HPI: Ms Traci Frank is a 39 year old P0 who was seen in consultation at the request of Dr Ihor Dow for evaluation of a fibroid uterus and disseminated leiomyomatosis.  The patient reported a history of a laparoscopic myomectomy with Dr. Barnett Hatter at [facility] in August 2012. Review of that operative note reveals that the specimen was an 1100 g fibroid uterus with the myomas extracted and then morcellated using a power morcellator without containment that was intraperitoneal prior to removal of fibroid fragments. Final pathology from that procedure confirmed benign leiomyomata with no evidence of atypia or increased cellularity or tumor necrosis.  She did well after that procedure with absence of her bulk symptoms for approximately 8 years. However in the preceding 2 years she reported increased sensation of infraumbilical bulk and mass-effect symptoms and increasing constipation. These felt similar to the symptoms she initially presented with. She denied particularly heavy menses, the has a regular menstrual cycle with a very heavy second day.  Due to intermittent pains and constipation and lack of her treating gynecologist with establish care she was seen in the emergency department on February 17, 2020. At that time a very large bulky fibroid uterus was identified on CT imaging and additional mesenteric and omental myomas were also seen. She was assisted to see Dr. Ihor Dow as an outpatient as follow-up to her ER visit. During the ER labs were taken which demonstrated iron deficiency anemia with a hemoglobin of 10 mg/dL.  Dr. Ihor Dow performed an MRI of the pelvis which was completed  on March 22, 2020. It revealed a markedly enlarged uterus measuring 18.5 x 8.9 x 13.3 cm. It contained multiple enhancing intramural fibroids. Both ovaries were normal. Scattered along the omentum were various masses. There was also a multilobed lobular lesion along the left omentum measuring 6.7 cm. These appeared consistent with disseminated peritoneal leiomyomatosis.  Her medical history is most significant for obesity with a BMI of 37 kg per metered squared. She has essential hypertension and had been in the past prescribed losartan and a beta-blocker by a primary care physician whom she no longer sees. It is unclear if she is regularly taking her antihypertensive medications. She has apparent chronic kidney disease with a baseline creatinine of 1.07. This is likely secondary to her uncontrolled hypertension.  Her surgical history is most significant for a laparoscopic myomectomy in 2012.  Her gynecologic history is remarkable for fibroid uterus, no history of abnormal paps.  Her family cancer history is unremarkable.  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 She lives with her cousin.   CBC    Component Value Date/Time   WBC 4.9 04/28/2020 1423   RBC 4.64 04/28/2020 1423   HGB 11.4 (L) 04/28/2020 1423   HGB 9.9 (L) 03/30/2020 1302   HCT 37.4 04/28/2020 1423   PLT 418 (H) 04/28/2020 1423   PLT 428 (H) 03/30/2020 1302   MCV 80.6 04/28/2020 1423   MCH 24.6 (L) 04/28/2020 1423   MCHC 30.5 04/28/2020 1423   RDW 18.6 (H) 04/28/2020 1423   LYMPHSABS 1.8 03/30/2020 1302   MONOABS 0.5 03/30/2020 1302   EOSABS 0.2 03/30/2020 1302   BASOSABS 0.0 03/30/2020 1302     Current Meds:  Outpatient Encounter Medications as of 03/30/2020  Medication Sig  . cetirizine (ZYRTEC) 10 MG tablet Take 1 tablet by mouth daily in the afternoon.  Marland Kitchen FLUoxetine (PROZAC) 10 MG capsule TAKE 3 CAPSULES (30 MG TOTAL) BY MOUTH DAILY. (Patient taking differently: Take 20 mg  by mouth daily. )  . ibuprofen (ADVIL,MOTRIN) 200 MG tablet Take 200 mg by mouth every 6 (six) hours as needed.  . Multiple Vitamin (THERA) TABS Take 1 tablet by mouth daily.  . ondansetron (ZOFRAN ODT) 4 MG disintegrating tablet Take 1 tablet (4 mg total) by mouth every 4 (four) hours as needed for nausea or vomiting.  Marland Kitchen oxyCODONE-acetaminophen (PERCOCET) 5-325 MG tablet Take 1 tablet by mouth every 6 (six) hours as needed.  . ferrous gluconate (FERGON) 324 MG tablet Take 1 tablet (324 mg total) by mouth 3 (three) times daily with meals.  Marland Kitchen losartan-hydrochlorothiazide (HYZAAR) 100-12.5 MG tablet Take 1 tablet by mouth daily. (Patient not taking: Reported on 03/04/2020)  . propranolol ER (INDERAL LA) 120 MG 24 hr capsule Take 120 mg by mouth daily. (Patient not taking: Reported on 03/30/2020)  . senna (SENOKOT) 8.6 MG TABS tablet Take 1 tablet (8.6 mg total) by mouth at bedtime.  . [DISCONTINUED] docusate sodium (COLACE) 100 MG capsule Take 1 capsule (100 mg total) by mouth every 12 (twelve) hours. (Patient not taking: Reported on 03/04/2020)  . [DISCONTINUED] losartan-hydrochlorothiazide (HYZAAR) 100-12.5 MG tablet Take 1 tablet by mouth daily. (Patient not taking: Reported on 03/30/2020)  . [DISCONTINUED] propranolol (INNOPRAN XL) 120 MG 24 hr capsule Take 1 capsule by mouth daily in the afternoon. (Patient not taking: Reported on 03/30/2020)   No facility-administered encounter medications on file as of 03/30/2020.  Allergy:  Allergies  Allergen Reactions  . Latex Itching    Social Hx:   Social History   Socioeconomic History  . Marital status: Single    Spouse name: Not on file  . Number of children: Not on file  . Years of education: Not on file  . Highest education level: Not on file  Occupational History  . Occupation: call center rep    Employer: [employer]  Tobacco Use  . Smoking status: Never Smoker  . Smokeless tobacco: Never Used  Vaping Use  . Vaping Use: Never used   Substance and Sexual Activity  . Alcohol use: Yes    Alcohol/week: 0.0 standard drinks    Comment: rarely  . Drug use: No  . Sexual activity: Yes    Partners: Male    Birth control/protection: Condom  Other Topics Concern  . Not on file  Social History Narrative  . Not on file   Social Determinants of Health   Financial Resource Strain:   . Difficulty of Paying Living Expenses: Not on file  Food Insecurity:   . Worried About Charity fundraiser in the Last Year: Not on file  . Ran Out of Food in the Last Year: Not on file  Transportation Needs:   . Lack of Transportation (Medical): Not on file  . Lack of Transportation (Non-Medical): Not on file  Physical Activity:   . Days of Exercise per Week: Not on file  . Minutes of Exercise per Session: Not on file  Stress:   . Feeling of Stress : Not on file  Social Connections:   . Frequency of Communication with Friends and Family: Not on file  . Frequency of Social Gatherings with Friends and Family: Not on file  . Attends Religious Services: Not on file  . Active Member of Clubs or Organizations: Not on file  . Attends Archivist Meetings: Not on file  . Marital Status: Not on file  Intimate Partner Violence:   . Fear of Current or Ex-Partner: Not on file  . Emotionally Abused: Not on file  . Physically Abused: Not on file  . Sexually Abused: Not on file    Past Surgical Hx:  Past Surgical History:  Procedure Laterality Date  . HERNIA REPAIR    . MYOMECTOMY      Past Medical Hx:  Past Medical History:  Diagnosis Date  . Abnormal involuntary movements(781.0)    Right hand  . Allergy   . Anemia   . Anxiety   . Depression   . Disturbance of skin sensation    Right arm  . Headache(784.0)   . Hypertension   . Tingling sensation 05/27/2013  . Tremor of unknown origin 05/27/2013  . Uterine fibroid   . Vertigo     Past Gynecological History:   Patient's last menstrual period was 04/13/2020.  Family  Hx:  Family History  Problem Relation Age of Onset  . Hypertension Mother   . Drug abuse Father   . Diabetes Maternal Grandmother   . Diabetes Maternal Grandfather   . Stroke Paternal Grandfather   . Bipolar disorder Cousin     Review of Systems:  Constitutional  Feels well,    ENT Normal appearing ears and nares bilaterally Skin/Breast  No rash, sores, jaundice, itching, dryness Cardiovascular  No chest pain, shortness of breath, or edema  Pulmonary  No cough or wheeze.  Gastro Intestinal  No nausea, vomitting, or diarrhoea. No bright red blood  per rectum, no abdominal pain, change in bowel movement, +constipation.  Genito Urinary  No frequency, urgency, dysuria, + pelvic pressure Musculo Skeletal  No myalgia, arthralgia, joint swelling or pain  Neurologic  No weakness, numbness, change in gait,  Psychology  No depression, anxiety, insomnia.   Vitals:  Blood pressure (!) 142/85, pulse (!) 107, temperature 98.9 F (37.2 C), temperature source Oral, resp. rate 16, weight 208 lb 6.4 oz (94.5 kg), last menstrual period 04/13/2020, SpO2 96 %.  Physical Exam: WD in NAD Neck  Supple NROM, without any enlargements.  Lymph Node Survey No cervical supraclavicular or inguinal adenopathy Cardiovascular  Pulse normal rate, regularity and rhythm. S1 and S2 normal.  Lungs  Clear to auscultation bilateraly, without wheezes/crackles/rhonchi. Good air movement.  Skin  No rash/lesions/breakdown  Psychiatry  Alert and oriented to person, place, and time  Abdomen  Normoactive bowel sounds, abdomen soft, non-tender and obese without evidence of hernia. Palpable mass in lower abdomen - mobile. Reaches umbilicus. Back No CVA tenderness Genito Urinary  Vulva/vagina: Normal external female genitalia.  No lesions. No discharge or bleeding.  Bladder/urethra:  No lesions or masses, well supported bladder  Vagina: normal to palpate  Cervix: Normal appearing, no lesions.  Uterus: Bulky,  25cm, mobile, no parametrial involvement or nodularity. Large posterior myoma palpable. Rectal  Good tone, no masses no cul de sac nodularity. Fibroid palpable posteriorally/ Extremities  No bilateral cyanosis, clubbing or edema.    Thereasa Solo, MD  05/07/2020, 7:04 AM

## 2020-05-07 NOTE — Transfer of Care (Signed)
Immediate Anesthesia Transfer of Care Note  Patient: Traci Frank  Procedure(s) Performed: EXPLORATORY LAPAROTOMY,TOTAL HYSTERECTOMY ABDOMINAL WITH BILATERAL  SALPINGECTOMY GREATER THAN 250GM (Bilateral ) OMENTECTOMY, RADICAL TUMOR DEBULKING (N/A )  Patient Location: PACU  Anesthesia Type:General  Level of Consciousness: awake  Airway & Oxygen Therapy: Patient Spontanous Breathing and Patient connected to face mask oxygen  Post-op Assessment: Report given to RN and Post -op Vital signs reviewed and stable  Post vital signs: Reviewed and stable  Last Vitals:  Vitals Value Taken Time  BP 122/76 05/07/20 1041  Temp    Pulse 78 05/07/20 1043  Resp 13 05/07/20 1043  SpO2 100 % 05/07/20 1043  Vitals shown include unvalidated device data.  Last Pain:  Vitals:   05/07/20 0635  TempSrc:   PainSc: 0-No pain         Complications: No complications documented.

## 2020-05-07 NOTE — Op Note (Signed)
OPERATIVE NOTE  Preoperative Diagnosis: disseminated leiomyomatosis peritoneii  Postoperative Diagnosis:same    Procedure(s) Performed: Exploratory laparotomy with total abdominal hysterectomy, bilateral salpingectomy, omentectomy, radical tumor debulking   Surgeon: Thereasa Solo, MD.  Assistant Surgeon: Lahoma Crocker, M.D. Assistant: (an MD assistant was necessary for tissue manipulation, retraction and positioning due to the complexity of the case and hospital policies).   Specimens: 1/ peritoneal tumor masses, 2/ omentum, 3/ uterus with cervix and bilateral fallopian tubes.    Estimated Blood Loss: 150cc   Urine Output: 150cc  IVF: 1200cc crystalloid  Complications: None.   Operative Findings: 20cm broad, bulky fibroid uterus. Fibroid tumors scattered throughout the peritoneal cavity, particularly within the left para-colic gutter, parasitic to the omentum, transverse colon and descending colon. Tumor nodules adherent to the anterior and left lateral peritoneum.  No gross residual tumor was present at the completion of the procedure.  Procedure:   The patient was seen in the Holding Room. The risks, benefits, complications, treatment options, and expected outcomes were discussed with the patient.  The patient concurred with the proposed plan, giving informed consent.   The patient was  identified as Traci Frank  and the procedure verified as TAH, bilateral salpingectomy, omentectomy, tumor debulking. A Time Out was held and the above information confirmed upon entry to the operating room..  After induction of anesthesia, the patient was draped and prepped in the usual sterile manner.  She was prepped and draped in the normal sterile fashion in the dorsal lithotomy position in padded Allen stirrups with good attention paid to support of the lower back and lower extremities. Position was adjusted for appropriate support. A Foley catheter was placed to gravity.   A right  para-median vertical incision was made and carried through the subcutaneous tissue to the fascia. The fascial incision was made and extended superiorally. The rectus muscles were separated. The peritoneum was identified and entered. Peritoneal incision was extended longitudinally.  The abdominal cavity was entered sharply and without incident. A Bookwalter retractor was then placed. A survey of the abdomen and pelvis revealed the above findings, which were significant scattered bulky peritoneal tumor nodules in the omentum and attached to the peritoneum of the anterior abdominal wall and left para-colic gutter.  The omental tumors were dissected free from the transverse colon from the hepatic flexure to the splenic flexure using sharp metzenbaum scissor dissection, the bovie and ligasure. The lesser sac was entered. The tumor was separated from the mesentery of the transverse colon. The short gastric vessels were sealed with ligasure and the infragastric omentum was separated from the greater curvature of the stomach removing all bulky tumor. Hemostasis was confirmed. The transverse colon was closely inspected and was noted to be intact and hemostatic.  The Bookwalter retractor was manipulated to expose the pelvis.  The small and large intestine were packed with moist laparotomy sponges malleable retractors into the upper abdomen.  This exposed the large fibroid uterus.  The round ligaments were grasped with Kelly clamps.  The cecum and sigmoid colon attachments to the ovarian vessels bilaterally were taken down with the Bovie.  The retroperitoneal spaces bilaterally were opened longitudinally to the ovarian vessels.  This facilitated exposure of the ureter in the retroperitoneum.  A window was created in the broad ligament underneath the ovarian vessels.  The fallopian tubes were separated from the ovary using the bipolar LigaSure device.  The utero-ovarian ligaments were then bipolar fulgurated and transected  using the LigaSure.  Ovaries bilaterally appeared  hemostatic.  The posterior peritoneum was taken down with the Bovie posteriorly to the level of the uterine isthmus.  Anteriorly the bladder flap was created using the Bovie and sharp dissection.  This mobilized the bladder to beyond the cervicovaginal junction.  This process skeletonized the uterine vessels bilaterally.  Curved Heaney clamps were placed across the uterine vessels at the level of the uterine isthmus which were then transected and suture ligated.  Successive passes of straight Heaney clamps took place through the cardinal ligaments with sharp transection and suture ligature.  At the level of the cervicovaginal junction curved Heaney clamps were pate placed bilaterally and the uterine specimen with its attached cervix were transected using a sharp dissection.  The corners of the vaginal cuff were sutured with 0 Vicryl intact.  The remainder of the vaginal cuff was closed with interrupted figure-of-eight 0 Vicryl sutures.  Hemostasis was observed at the pelvis.  The remainder of the peritoneal cavity was inspected.  Additional tumor nodules were identified in the left paracolic gutter.  The Bookwalter was adjusted and the bowel was carefully retracted to expose this tissue.  One of the tumor nodules was parasitizing to the descending colon.  Sharp dissection took place at this point to separate the tumor nodule from the descending colon and the descending colon serosa was imbricated with 3-0 Vicryl Lembert sutures to reinforce hemostasis.  The tumor nodules were then separated from their attachments to the peritoneal cavity using the LigaSure.  All specimens were sent for permanent pathology.  Copious irrigation took place. Hemostasis was confirmed at all sites.  Manual and visible sweep of the peritoneal cavity confirmed no residual tumor nodules.  This represented an R0 resection and no gross residual disease.  The fascia was reapproximated  with #1 looped PDS using a total of two sutures. The subcutaneous layer was then irrigated copiously.  Exparel long acting local anesthetic was infiltrated into the subcutaneous tissues. The skin was closed with subcuticular sutures. The patient tolerated the procedure well.   Sponge, lap and needle counts were correct x 2.   Donaciano Eva, MD

## 2020-05-07 NOTE — Anesthesia Postprocedure Evaluation (Signed)
Anesthesia Post Note  Patient: Traci Frank  Procedure(s) Performed: EXPLORATORY LAPAROTOMY,TOTAL HYSTERECTOMY ABDOMINAL WITH BILATERAL  SALPINGECTOMY GREATER THAN 250GM (Bilateral ) OMENTECTOMY, RADICAL TUMOR DEBULKING (N/A )     Patient location during evaluation: PACU Anesthesia Type: General Level of consciousness: awake and alert Pain management: pain level controlled Vital Signs Assessment: post-procedure vital signs reviewed and stable Respiratory status: spontaneous breathing, nonlabored ventilation, respiratory function stable and patient connected to nasal cannula oxygen Cardiovascular status: blood pressure returned to baseline and stable Postop Assessment: no apparent nausea or vomiting Anesthetic complications: no   No complications documented.  Last Vitals:  Vitals:   05/07/20 1215 05/07/20 1248  BP: 137/86 137/75  Pulse: 74 84  Resp: 12 20  Temp: 36.8 C 36.8 C  SpO2: 95% 100%    Last Pain:  Vitals:   05/07/20 1341  TempSrc:   PainSc: [place]

## 2020-05-08 ENCOUNTER — Encounter (HOSPITAL_COMMUNITY): Payer: Self-pay | Admitting: Gynecologic Oncology

## 2020-05-08 LAB — BASIC METABOLIC PANEL
Anion gap: 9 (ref 5–15)
BUN: 7 mg/dL (ref 6–20)
CO2: 22 mmol/L (ref 22–32)
Calcium: 7.9 mg/dL — ABNORMAL LOW (ref 8.9–10.3)
Chloride: 102 mmol/L (ref 98–111)
Creatinine, Ser: 0.84 mg/dL (ref 0.44–1.00)
GFR, Estimated: >60 mL/min (ref >60)
Glucose, Bld: 222 mg/dL — ABNORMAL HIGH (ref 70–99)
Potassium: 4 mmol/L (ref 3.5–5.1)
Sodium: 133 mmol/L — ABNORMAL LOW (ref 135–145)

## 2020-05-08 LAB — CBC
HCT: 32.9 % — ABNORMAL LOW (ref 36.0–46.0)
Hemoglobin: 10 g/dL — ABNORMAL LOW (ref 12.0–15.0)
MCH: 25.4 pg — ABNORMAL LOW (ref 26.0–34.0)
MCHC: 30.4 g/dL (ref 30.0–36.0)
MCV: 83.5 fL (ref 80.0–100.0)
Platelets: 318 10*3/uL (ref 150–400)
RBC: 3.94 MIL/uL (ref 3.87–5.11)
RDW: 19 % — ABNORMAL HIGH (ref 11.5–15.5)
WBC: 16.7 10*3/uL — ABNORMAL HIGH (ref 4.0–10.5)
nRBC: 0 % (ref 0.0–0.2)

## 2020-05-08 LAB — GLUCOSE, CAPILLARY: Glucose-Capillary: 105 mg/dL — ABNORMAL HIGH (ref 70–99)

## 2020-05-08 NOTE — Progress Notes (Signed)
1 Day Post-Op Procedure(s) (LRB): EXPLORATORY LAPAROTOMY,TOTAL HYSTERECTOMY ABDOMINAL WITH BILATERAL  SALPINGECTOMY GREATER THAN 250GM (Bilateral) OMENTECTOMY, RADICAL TUMOR DEBULKING (N/A)  Subjective: Patient reports moderate incisional pain when waking this am that was relieved with IV pain meds. She ambulated last pm with mild dizziness reported. She has sat up in the chair as well. Due to void. No nausea or emesis reported. No flatus but states she can feel her stomach rumbling. Denies chest pain, dyspnea. No concerns voiced.   Objective: Vital signs in last 24 hours: Temp:  [98 F (36.7 C)-99 F (37.2 C)] 98.7 F (37.1 C) (12/03 0914) Pulse Rate:  [74-93] 91 (12/03 0914) Resp:  [10-20] 17 (12/03 0914) BP: (116-140)/(66-86) 118/66 (12/03 0914) SpO2:  [95 %-100 %] 100 % (12/03 0914) Weight:  [208 lb 5.4 oz (94.5 kg)] 208 lb 5.4 oz (94.5 kg) (12/02 1248)    Intake/Output from previous day: 12/02 0701 - 12/03 0700 In: 4259.5 [P.O.:220; I.V.:3839.5; IV Piggyback:200] Out: 2975 [Urine:2825; Blood:150]  Physical Examination: General: alert, cooperative and no distress Resp: clear to auscultation bilaterally Cardio: regular rate and rhythm, S1, S2 normal, no murmur, click, rub or gallop GI: incision: op site dressing to midline abdomen incision with no active drainage noted underneath and abdomen soft, active bowel sounds Extremities: extremities normal, atraumatic, no cyanosis or edema  Labs: WBC/Hgb/Hct/Plts:  16.7/10.0/32.9/318 (12/03 0322) BUN/Cr/glu/ALT/AST/amyl/lip:  7/0.84/--/--/--/--/-- (12/03 0322)  Assessment: 39 y.o. s/p Procedure(s): EXPLORATORY LAPAROTOMY,TOTAL HYSTERECTOMY ABDOMINAL WITH BILATERAL  SALPINGECTOMY GREATER THAN 250GM OMENTECTOMY, RADICAL TUMOR DEBULKING: stable Pain:  Pain is well-controlled on PRN medications.  Heme: Hgb 10.0 and Hct 32.9 this am-stable compared with preop values and surgical losses.  CV: BP and HR stable. Hx HTN. Continue to  monitor with routine vital signs.  GI:  Tolerating po: Yes. Antiemetics ordered if needed.  GU: Due to void. Adequate output reported.   FEN: No critical values noted.  Prophylaxis: SCDs and lovenox ordered.  Plan: Plan to saline lock IV if diet tolerated this am Encourage ambulation, IS use, deep breathing, and coughing Continue plan of care Probable discharge tomorrow in the am if progressing well   LOS: 1 day    Dorothyann Gibbs 05/08/2020, 9:39 AM

## 2020-05-09 MED ORDER — GLYCERIN (LAXATIVE) 2.1 G RE SUPP
1.0000 | Freq: Once | RECTAL | Status: AC
  Administered 2020-05-09: 1 via RECTAL
  Filled 2020-05-09 (×2): qty 1

## 2020-05-09 NOTE — Discharge Summary (Signed)
Physician Discharge Summary  Patient ID: Traci Frank MRN: [id number] DOB/AGE: 1981-02-25 39 y.o.  Admit date: 05/07/2020 Discharge date: 05/09/2020  Admission Diagnoses: Benign leiomyomatosis peritonealis disseminata  Discharge Diagnoses:  Principal Problem:   Benign leiomyomatosis peritonealis disseminata Active Problems:   Leiomyomatosis   Iron deficiency anemia due to chronic blood loss   Pelvic mass in female   Discharged Condition: good  Hospital Course: On 05/07/2020, the patient underwent the following: Procedure(s): EXPLORATORY LAPAROTOMY,TOTAL HYSTERECTOMY ABDOMINAL WITH BILATERAL  SALPINGECTOMY GREATER THAN 250GM OMENTECTOMY, RADICAL TUMOR DEBULKING.   The postoperative course was uneventful.  She was discharged to home on postoperative day 2 tolerating a regular diet.  Consults: None  Significant Diagnostic Studies: none  Treatments: see above  Discharge Exam: Blood pressure (!) 125/91, pulse 73, temperature 98.8 F (37.1 C), temperature source Oral, resp. rate 18, height 5\' 2"  (1.575 m), weight 94.5 kg, last menstrual period 04/13/2020, SpO2 99 %. General appearance: alert GI: softly distended, NT Extremities: extremities normal, atraumatic, no cyanosis or edema and Homans sign is negative, no sign of DVT Incision/Wound: C/D/I under the honeycomb dressing No blood per vagina  Disposition: Discharge disposition: 01-Home or Self Care       Discharge Instructions     Remove dressing in 72 hours   Complete by: As directed    Activity as tolerated - No restrictions   Complete by: As directed    Call MD for:  extreme fatigue   Complete by: As directed    Call MD for:  persistant dizziness or light-headedness   Complete by: As directed    Call MD for:  persistant nausea and vomiting   Complete by: As directed    Call MD for:  redness, tenderness, or signs of infection (pain, swelling, redness, odor or green/yellow discharge around incision site)    Complete by: As directed    Call MD for:  severe uncontrolled pain   Complete by: As directed    Call MD for:  temperature >100.4   Complete by: As directed    Diet - low sodium heart healthy   Complete by: As directed    Diet general   Complete by: As directed    Driving Restrictions   Complete by: As directed    No driving for 1- 2 weeks   Increase activity slowly   Complete by: As directed    Lifting restrictions   Complete by: As directed    No lifting > 10 lbs for 6 weeks   May shower / Bathe   Complete by: As directed    No tub baths for 6 weeks   Sexual Activity Restrictions   Complete by: As directed    No intercourse for 6 - 8 weeks     Allergies as of 05/09/2020      Reactions   Latex Itching      Medication List    TAKE these medications   amLODipine 5 MG tablet Commonly known as: NORVASC Take 1 tablet (5 mg total) by mouth daily.   cetirizine 10 MG tablet Commonly known as: ZYRTEC Take 10 mg by mouth daily in the afternoon.   chlorthalidone 25 MG tablet Commonly known as: HYGROTON Take 1 tablet (25 mg total) by mouth daily.   enoxaparin 40 MG/0.4ML injection Commonly known as: LOVENOX Inject 0.4 mLs (40 mg total) into the skin daily for 28 doses. For AFTER surgery only   erythromycin base 500 MG tablet Commonly known as: E-MYCIN Take 2 tablets  at 2 pm, 3 pm, and 10 pm the day before surgery   ferrous gluconate 324 MG tablet Commonly known as: FERGON Take 1 tablet (324 mg total) by mouth 3 (three) times daily with meals.   FLUoxetine 10 MG capsule Commonly known as: PROZAC TAKE 3 CAPSULES (30 MG TOTAL) BY MOUTH DAILY.   ibuprofen 200 MG tablet Commonly known as: ADVIL Take 200 mg by mouth every 6 (six) hours as needed.   ibuprofen 600 MG tablet Commonly known as: ADVIL Take 1 tablet (600 mg total) by mouth every 8 (eight) hours as needed for moderate pain. For AFTER surgery only. Use sparingly as needed.   LORazepam 0.5 MG  tablet Commonly known as: ATIVAN Take 0.5 mg by mouth every 8 (eight) hours as needed for anxiety.   losartan 100 MG tablet Commonly known as: COZAAR Take 1 tablet (100 mg total) by mouth daily.   neomycin 500 MG tablet Commonly known as: MYCIFRADIN Take 2 tablets at 2 pm, 3 pm, and 10 pm the day before surgery   ondansetron 4 MG disintegrating tablet Commonly known as: Zofran ODT Take 1 tablet (4 mg total) by mouth every 4 (four) hours as needed for nausea or vomiting.   oxyCODONE 5 MG immediate release tablet Commonly known as: Oxy IR/ROXICODONE Take 1 tablet (5 mg total) by mouth every 4 (four) hours as needed for severe pain. For AFTER surgery only, do not take and drive   oxyCODONE-acetaminophen 5-325 MG tablet Commonly known as: Percocet Take 1 tablet by mouth every 6 (six) hours as needed.   senna 8.6 MG Tabs tablet Commonly known as: SENOKOT Take 1 tablet (8.6 mg total) by mouth at bedtime.   senna-docusate 8.6-50 MG tablet Commonly known as: Senokot-S Take 2 tablets by mouth at bedtime. For AFTER surgery, do not take if having diarrhea   Thera Tabs Take 1 tablet by mouth daily.        Signed: Lahoma Crocker, MD 05/09/2020, 9:56 AM

## 2020-05-09 NOTE — Discharge Instructions (Signed)
Hysterectomy, Abdominal  Care After Please read the instructions below. Refer to these instructions for the next few weeks. These instructions provide you with general information on caring for yourself after surgery. Your caregiver may also give you specific instructions. While your treatment has been planned according to the most current medical practices available, unavoidable problems sometimes happen. If you have any problems or questions after you leave, please call your caregiver. HOME CARE INSTRUCTIONS Healing will take time. You will have discomfort, tenderness, swelling and bruising at the operative site for a couple of weeks. This is normal and will get better as time goes on.   Only take over-the-counter or prescription medicines for pain, discomfort or fever as directed by your caregiver.   Do not take aspirin. It can cause bleeding.   Do not drive when taking pain medication.   Follow your caregivers advice regarding diet, exercise, lifting, driving and general activities.   Resume your usual diet as directed and allowed.   Get plenty of rest and sleep.   Do not douche, use tampons, or have sexual intercourse until your caregiver gives you permission.   Change your bandages (dressings) as directed.   Take your temperature twice a day. Write it down.   Your caregiver may recommend showers instead of baths for a few weeks.   Do not drink alcohol until your caregiver gives you permission.   If you develop constipation, you may take a mild laxative with your caregivers permission. Bran foods and drinking fluids helps with constipation problems.   Try to have someone home with you for a week or two to help with the household activities.   Make sure you and your family understands everything about your operation and recovery.   Do not sign any legal documents until you feel normal again.   Keep all your follow-up appointments as recommended by your caregiver.  SEEK  MEDICAL CARE IF:  There is swelling, redness or increasing pain in the wound area.   Pus is coming from the wound.   You notice a bad smell from the wound or surgical dressing.   You have pain, redness and swelling from the intravenous site.   The wound is breaking open (the edges are not staying together).   You feel dizzy or feel like fainting.   You develop pain or bleeding when you urinate.   You develop diarrhea.   You develop nausea and vomiting.   You develop abnormal vaginal discharge.   You develop a rash.   You have any type of abnormal reaction or develop an allergy to your medication.   You need stronger pain medication for your pain.  SEEK IMMEDIATE MEDICAL CARE:  You develop a temperature of 100.6 or higher.   You develop abdominal pain.   You develop chest pain.   You develop shortness of breath.   You pass out.   You develop pain, swelling or redness of your leg.   You develop heavy vaginal bleeding with or without blood clots.  Document Released: 12/10/2004 Document Re-Released: 11/10/2009 [facility] Patient Information 2011 [place].  For any postoperative concerns call [phone]

## 2020-05-11 ENCOUNTER — Telehealth: Payer: Self-pay

## 2020-05-11 LAB — BPAM RBC
Blood Product Expiration Date: 202112172359
Blood Product Expiration Date: 202201022359
Unit Type and Rh: 7300
Unit Type and Rh: 7300

## 2020-05-11 LAB — TYPE AND SCREEN
ABO/RH(D): B POS
Antibody Screen: NEGATIVE
Unit division: 0
Unit division: 0

## 2020-05-11 LAB — SURGICAL PATHOLOGY

## 2020-05-11 NOTE — Telephone Encounter (Signed)
Traci Frank states that she is eating,drinking, and urinating well. Passing gas. Pt has has 3 - 4 watery stools a day since discharge.  She did not start the senokot-s.  Told her that she may have watery stools for several days as the bowel prep/ATB  wipes out the normal flora.  She needs to call the office if she has 8 or greater watery stools as this could be from an organism that can cause diarrhea and her stool would need to be tested. Afebrile. Incision D&I Honeycomb dressing in place.  Pt to remove tomorrow or Wednesday. Pt feeling the knot of the sutures at the top of the incision. Numbness in the lower abdomen.  Told her numbness normal with nerves being cut from surgery.  This will gradually improve.  She is using abdominal binder for support. Pt pain level is currently 5/10. She took Ibuprofen 600 mg,Oxycodone 5 mg, and gabapentin 400 mg at 0530.  [name] to take her Oxycodone 5 mg now.She needs to stager the Ibuprofen with 500-100 mg of tylenol every 6 hrs to keep pain med in system. She can take the Ibuprofen at 1 pm with food. And then tylenol 1000 mg at 4 pm.  Keep alternating meds ATC. Use the Oxycodone in between prn. Continue with the gabapentin 400 mg every ~5 hours per Melissa Cross,NP. Pt aware of post op appointments as well as the office number [phone] and after hours number [phone] to call if she has any questions or concerns

## 2020-05-11 NOTE — Telephone Encounter (Addendum)
[name] that the final surgical pathology showing fibroids. No pre cancer or cancer seen per Traci [name], NP.

## 2020-05-14 ENCOUNTER — Telehealth: Payer: Self-pay

## 2020-05-14 NOTE — Telephone Encounter (Signed)
Traci Frank states that she took her gabapentin and ibuprofen last night and went to bed.  Her temp this am is 98.0. No shaking chills. Incision is D&I. No redness or increased pain around incision. Her abdominal pain in improving and is able to ambulate more easily. Pt drinking plenty of fluids. She is straining with BM. She only used senokot-s one night. Suggested restarting the senokot-s. Pt stated that she wanted to try her linzess first. Pt will call if she has any more questions or concerns.

## 2020-05-14 NOTE — Telephone Encounter (Signed)
No answer. Voice mail box full. Will attempt to call patient later.

## 2020-05-18 ENCOUNTER — Telehealth: Payer: Self-pay | Admitting: *Deleted

## 2020-05-18 ENCOUNTER — Encounter: Payer: Self-pay | Admitting: Gynecologic Oncology

## 2020-05-18 NOTE — Telephone Encounter (Signed)
Traci Frank stated that the incision is draining serosanguineous fluid  when she lies  down.  She will put bandaids on the open areas to absorb the drainage as she did yesterday. She states that the area to the left and right of the lower portion of the incision is slightly red. She is afebrile. She will come in tomorrow to see [name], NP at 1000 to look at the incision.

## 2020-05-18 NOTE — Telephone Encounter (Signed)
Patient called and stated "I had surgery on 12/2 with Dr Denman George. I removed the honey cone dressing on 12/8. Starting yesterday the incision started oozing at the top and middle of the incision. The drainage is like a peach color. There is no odor, it isn't red or warm to the touch. I have no fever. I put band-aids over the opening."

## 2020-05-19 ENCOUNTER — Inpatient Hospital Stay: Payer: Self-pay | Attending: Gynecologic Oncology | Admitting: Gynecologic Oncology

## 2020-05-19 ENCOUNTER — Encounter: Payer: Self-pay | Admitting: Gynecologic Oncology

## 2020-05-19 ENCOUNTER — Other Ambulatory Visit: Payer: Self-pay

## 2020-05-19 VITALS — BP 132/85 | HR 90 | Temp 96.8°F | Resp 20 | Wt 208.0 lb

## 2020-05-19 DIAGNOSIS — L24A9 Irritant contact dermatitis due friction or contact with other specified body fluids: Secondary | ICD-10-CM

## 2020-05-19 DIAGNOSIS — Z90722 Acquired absence of ovaries, bilateral: Secondary | ICD-10-CM | POA: Insufficient documentation

## 2020-05-19 DIAGNOSIS — Z90710 Acquired absence of both cervix and uterus: Secondary | ICD-10-CM | POA: Insufficient documentation

## 2020-05-19 DIAGNOSIS — D484 Neoplasm of uncertain behavior of peritoneum: Secondary | ICD-10-CM

## 2020-05-19 DIAGNOSIS — D201 Benign neoplasm of soft tissue of peritoneum: Secondary | ICD-10-CM | POA: Insufficient documentation

## 2020-05-19 NOTE — Progress Notes (Signed)
Met with patient at registration desk whom needed copies made for [employer] application. Made copies and gave her originals back. She will mail application directly to Customer Service to be processed.   Approved patient for one-time $1000 [employer] grant to assist with personal expenses while recovering from treatment. Discussed in detail expenses and how they are covered. She received a gas card today from her grant. She has a copy of the approval letter and expense sheet along with the Outpatient pharmacy information.  She has my card for any additional financial questions or concerns.

## 2020-05-19 NOTE — Patient Instructions (Signed)
The opening in the incision will most likely drain more since it is open more superficially. We want the fluid to drain out and not collect under the skin to prevent infection. If you notice the area becomes red, drains pus-like drainage, or for any other concerns, please call the office to be seen sooner. We will see you in the office on Friday to reassess the area.   You are safe to go upstairs and go on light walks as tolerated. Please call for any needs or concerns. The after hours number is [phone].

## 2020-05-19 NOTE — Progress Notes (Signed)
GYN Oncology Symptom Management  Traci Frank is a 39 year old female s/p exploratory laparotomy with total abdominal hysterectomy, bilateral salpingectomy, omentectomy, radical tumor debulking on 05/07/2020 for disseminated leiomyomatosis peritoneii. She presents today for evaluation of her abdominal incision. After removing the op site dressing, she began noticing incisional drainage from the upper aspect and middle of her incision. The drainage is described as peach colored. Denies fever, chills. No odor, erythema, or increased warmth reported from the incision.    Exam: Alert, oriented x3, in no acute distress. The abdomen incision is noted to have a pin point area in the middle of the incision with small drops of serosanguinous drainage with palpation. No large fluid collections palpated around this area. No evidence of infection or fluctuance. With light probing of the draining area, the skin opened superficially with the incision and tissues underneath appearing well healed.    Plan: Incisional drainage post-operatively. No findings warranting incision and drainage of the area. She is advised to expect a potential increase in drainage since the incision was slightly opened superficially at the surface. She is advised to follow up on Friday in the office for re-evaluation of the incision. She is given neosporin packets to apply to the upper area on the incision that was accidentally scratched and to the surrounding skin where the drainage is occurring. Reportable signs and symptoms reviewed. She is advised to call for any needs or concerns.

## 2020-05-20 ENCOUNTER — Telehealth: Payer: Self-pay

## 2020-05-20 NOTE — Telephone Encounter (Signed)
Traci Frank states that the drainage has decreased from yesterday. No purulent drainage or foul odor noted. She took a shower today which went well. Afebrile. Traci Hocevar knows to call if drainage if  she develops a temperature 100.4 or greater and or white pussy drainage on dressing. Melissa Cross,NP notified of the above.

## 2020-05-21 ENCOUNTER — Encounter: Payer: Self-pay | Admitting: Gynecologic Oncology

## 2020-05-22 ENCOUNTER — Other Ambulatory Visit: Payer: Self-pay

## 2020-05-22 ENCOUNTER — Inpatient Hospital Stay (HOSPITAL_BASED_OUTPATIENT_CLINIC_OR_DEPARTMENT_OTHER): Payer: Self-pay | Admitting: Gynecologic Oncology

## 2020-05-22 VITALS — BP 122/77 | HR 72 | Temp 97.0°F | Resp 16 | Ht 62.0 in | Wt 199.1 lb

## 2020-05-22 DIAGNOSIS — Z90710 Acquired absence of both cervix and uterus: Secondary | ICD-10-CM

## 2020-05-22 DIAGNOSIS — D484 Neoplasm of uncertain behavior of peritoneum: Secondary | ICD-10-CM

## 2020-05-22 DIAGNOSIS — Z90722 Acquired absence of ovaries, bilateral: Secondary | ICD-10-CM

## 2020-05-22 NOTE — Patient Instructions (Signed)
Your incision is healing without signs of infection. You can keep applying neosporin to the area. Please call the drainage returns, you develop redness in that area, increased warmth, fever, or any new symptoms. Plan to follow up as scheduled or sooner if needed.

## 2020-05-22 NOTE — Progress Notes (Signed)
GYN ONC Symptom Management  Traci Frank is a 39 year old female s/p exploratory laparotomy with total abdominal hysterectomy, bilateral salpingectomy, omentectomy, radical tumor debulking on 05/07/2020 for disseminated leiomyomatosis peritoneii. She presents today for re-evaluation of her abdominal incision. She was seen earlier in the week for drainage from the incision. Today she states the drainage has stopped and she feels the neosporin assisted with the healing of the area as well. She continues to have intermittent pain at the lower aspect of the incision and reports intermittent twinges/shooting pains.  Exam: Alert, oriented x3, in no acute distress. The abdomen incision is well healed. No erythema or drainage noted. The superficial opening where the previous site of drainage was is healing well. No evidence of infection or fluctuance. Dr. Denman George assessed incision as well.  Plan: She is advised to follow up as planned for the 30th of December or sooner if needed. She is advised that she can continue to apply neosporin to the area until scabbed over. Reportable signs and symptoms reviewed. She is advised to call for any needs or concerns.

## 2020-05-28 ENCOUNTER — Telehealth: Payer: Self-pay

## 2020-05-28 NOTE — Telephone Encounter (Signed)
[name] that it is normal to have light pink spotting after her surgery.  She is nearing  The 6 week mark when the stiches in the vaginal cuff dissolve and can cause spotting.  She would need to call the office if the bleeding were heavy like a period.  Pt verbalized understanding.

## 2020-06-01 ENCOUNTER — Other Ambulatory Visit: Payer: Self-pay | Admitting: Medical

## 2020-06-01 ENCOUNTER — Telehealth: Payer: Self-pay | Admitting: Medical

## 2020-06-01 MED ORDER — AMLODIPINE BESYLATE 5 MG PO TABS
5.0000 mg | ORAL_TABLET | Freq: Every day | ORAL | 3 refills | Status: DC
Start: 2020-06-01 — End: 2020-06-26

## 2020-06-01 NOTE — Telephone Encounter (Signed)
Rx sent 

## 2020-06-01 NOTE — Telephone Encounter (Signed)
Medication:   amLODipine (NORVASC)  10 mg  Has the patient contacted their pharmacy?  (If no, request that the patient contact the pharmacy for the refill.) (If yes, when and what did the pharmacy advise?)     Preferred Pharmacy (with phone number or street name):  [facility], [address], [place] [address]  Phone:  [phone] Fax:  [phone]     Agent: Please be advised that RX refills may take up to 3 business days. We ask that you follow-up with your pharmacy.

## 2020-06-01 NOTE — Progress Notes (Signed)
Follow-up Note: Gyn-Onc  Consult was requested by Dr. Ihor Dow for the evaluation of Traci Frank 39 y.o. female  CC:  Chief Complaint  Patient presents with  . Leiomyomatosis peritonealis disseminata    Assessment/Plan:  Traci Frank  is a 39 y.o.  year old with a history of disseminated benign leiomyomatosis peritonei s/p TAH, bilateral salpingectomy, omentectomy, radical tumor debulking on 05/07/20.  No specific oncologic follow-up necessary given the consistently benign nature of all masses. Do not recommend serial surveillance imaging.  There is a small potential for future growth of fibroids in the peritoneal cavity. Should this occur and should they be symptomatic, she should be considered for surgical excision at that time.    HPI: Traci Frank is a 39 year old P0 who was seen in consultation at the request of Dr Ihor Dow for evaluation of a fibroid uterus and disseminated leiomyomatosis.  The patient reported a history of a laparoscopic myomectomy with Dr. Barnett Hatter at [facility] in August 2012. Review of that operative note reveals that the specimen was an 1100 g fibroid uterus with the myomas extracted and then morcellated using a power morcellator without containment that was described intraperitoneal prior to removal of fibroid fragments. Final pathology from that procedure confirmed benign leiomyomata with no evidence of atypia or increased cellularity or tumor necrosis.  She did well after that procedure with absence of her bulk symptoms for approximately 8 years. However in the preceding 2 years she reported increased sensation of infraumbilical bulk and mass-effect symptoms and increasing constipation. These felt similar to the symptoms she initially presented with. She denied particularly heavy menses, the has a regular menstrual cycle with a very heavy second day.  Due to intermittent pains and constipation and lack of her treating  gynecologist with establish care she was seen in the emergency department on February 17, 2020. At that time a very large bulky fibroid uterus was identified on CT imaging and additional mesenteric and omental myomas were also seen. She was assisted to see Dr. Ihor Dow as an outpatient as follow-up to her ER visit. During the ER labs were taken which demonstrated iron deficiency anemia with a hemoglobin of 10 mg/dL.  Dr. Ihor Dow performed an MRI of the pelvis which was completed on March 22, 2020. It revealed a markedly enlarged uterus measuring 18.5 x 8.9 x 13.3 cm. It contained multiple enhancing intramural fibroids. Both ovaries were normal. Scattered along the omentum were various masses. There was also a multilobed lobular lesion along the left omentum measuring 6.7 cm. These appeared consistent with disseminated peritoneal leiomyomatosis.  CT chest was benign (no evidence of pulmonary nodules).   Interval Hx:  On 05/07/20 she underwent ex lap, TAH, BSO, omentectomy, radical tumor debulking. Intraoperative findings were significant for a 20 cm broad bulky fibroid uterus.  Fibroid tumors scattered throughout the peritoneal cavity, particularly with in the left paracolic gutter, parasitic fibroids to the omentum and transverse colon and descending colon.  Tumor nodules adherent to the anterior and left lateral peritoneum.  No gross residual tumor present at the completion of the procedure. Surgery was uncomplicated.  Final pathology revealed leiomyomata >1000gm with no evidence of hypercellularity, malignancy, or atypia.  Since surgery she developed some drainage from her lower aspect of her abdominal incision which resolved spontaneously.  She reported some right lower quadrant abdominal wall discomfort and numbness.  She reported some intermittent vaginal drainage of brown discharge.   Current Meds:  Outpatient Encounter Medications as of  06/04/2020  Medication Sig  . amLODipine  (NORVASC) 5 MG tablet Take 1 tablet (5 mg total) by mouth daily.  . cetirizine (ZYRTEC) 10 MG tablet Take 10 mg by mouth daily in the afternoon.   . chlorthalidone (HYGROTON) 25 MG tablet Take 1 tablet (25 mg total) by mouth daily. (Patient not taking: No sig reported)  . enoxaparin (LOVENOX) 40 MG/0.4ML injection Inject 0.4 mLs (40 mg total) into the skin daily for 28 doses. For AFTER surgery only  . ferrous gluconate (FERGON) 324 MG tablet Take 1 tablet (324 mg total) by mouth 3 (three) times daily with meals.  Marland Kitchen FLUoxetine (PROZAC) 10 MG capsule TAKE 3 CAPSULES (30 MG TOTAL) BY MOUTH DAILY. (Patient taking differently: Take 20 mg by mouth daily.)  . ibuprofen (ADVIL) 600 MG tablet Take 1 tablet (600 mg total) by mouth every 8 (eight) hours as needed for moderate pain. For AFTER surgery only. Use sparingly as needed.  Marland Kitchen linaCLOtide (LINZESS PO) Take 1 tablet by mouth daily as needed.  Marland Kitchen LORazepam (ATIVAN) 0.5 MG tablet Take 0.5 mg by mouth every 8 (eight) hours as needed for anxiety. (Patient not taking: No sig reported)  . losartan (COZAAR) 100 MG tablet Take 1 tablet (100 mg total) by mouth daily.  . Multiple Vitamin (THERA) TABS Take 1 tablet by mouth daily. (Patient not taking: Reported on 05/21/2020)  . ondansetron (ZOFRAN ODT) 4 MG disintegrating tablet Take 1 tablet (4 mg total) by mouth every 4 (four) hours as needed for nausea or vomiting.  Marland Kitchen oxyCODONE (OXY IR/ROXICODONE) 5 MG immediate release tablet Take 1 tablet (5 mg total) by mouth every 4 (four) hours as needed for severe pain. For AFTER surgery only, do not take and drive  . oxyCODONE-acetaminophen (PERCOCET) 5-325 MG tablet Take 1 tablet by mouth every 6 (six) hours as needed. (Patient not taking: No sig reported)  . [DISCONTINUED] amLODipine (NORVASC) 5 MG tablet Take 1 tablet (5 mg total) by mouth daily. (Patient taking differently: Take 10 mg by mouth daily.)  . [DISCONTINUED] losartan (COZAAR) 100 MG tablet Take 1 tablet (100 mg  total) by mouth daily.   No facility-administered encounter medications on file as of 06/04/2020.    Allergy:  Allergies  Allergen Reactions  . Latex Itching    Social Hx:   Social History   Socioeconomic History  . Marital status: Single    Spouse name: Not on file  . Number of children: Not on file  . Years of education: Not on file  . Highest education level: Not on file  Occupational History  . Occupation: call center rep    Employer: [employer]  Tobacco Use  . Smoking status: Never Smoker  . Smokeless tobacco: Never Used  Vaping Use  . Vaping Use: Never used  Substance and Sexual Activity  . Alcohol use: Yes    Alcohol/week: 0.0 standard drinks    Comment: rarely  . Drug use: No  . Sexual activity: Yes    Partners: Male    Birth control/protection: Condom  Other Topics Concern  . Not on file  Social History Narrative  . Not on file   Social Determinants of Health   Financial Resource Strain: Not on file  Food Insecurity: Not on file  Transportation Needs: Not on file  Physical Activity: Not on file  Stress: Not on file  Social Connections: Not on file  Intimate Partner Violence: Not on file    Past Surgical Hx:  Past  Surgical History:  Procedure Laterality Date  . HERNIA REPAIR    . HYSTERECTOMY ABDOMINAL WITH SALPINGECTOMY Bilateral 05/07/2020   Procedure: EXPLORATORY LAPAROTOMY,TOTAL HYSTERECTOMY ABDOMINAL WITH BILATERAL  SALPINGECTOMY GREATER THAN 250GM;  Surgeon: Adolphus Birchwood, MD;  Location: [facility];  Service: Gynecology;  Laterality: Bilateral;  . MYOMECTOMY    . OMENTECTOMY N/A 05/07/2020   Procedure: OMENTECTOMY, RADICAL TUMOR DEBULKING;  Surgeon: Adolphus Birchwood, MD;  Location: [facility];  Service: Gynecology;  Laterality: N/A;    Past Medical Hx:  Past Medical History:  Diagnosis Date  . Abnormal involuntary movements(781.0)    Right hand  . Allergy   . Anemia   . Anxiety   . Depression   . Disturbance of skin sensation    Right arm  .  Headache(784.0)   . Hypertension   . Tingling sensation 05/27/2013  . Tremor of unknown origin 05/27/2013  . Uterine fibroid   . Vertigo     Past Gynecological History:   No LMP recorded.  Family Hx:  Family History  Problem Relation Age of Onset  . Hypertension Mother   . Drug abuse Father   . Diabetes Maternal Grandmother   . Diabetes Maternal Grandfather   . Stroke Paternal Grandfather   . Bipolar disorder Cousin     Review of Systems:  Constitutional  Feels well,    ENT Normal appearing ears and nares bilaterally Skin/Breast  No rash, sores, jaundice, itching, dryness Cardiovascular  No chest pain, shortness of breath, or edema  Pulmonary  No cough or wheeze.  Gastro Intestinal  No nausea, vomitting, or diarrhoea. No bright red blood per rectum, no abdominal pain, change in bowel movement.  Genito Urinary  No frequency, urgency, dysuria, + bloody discharge (small volume) Musculo Skeletal  No myalgia, arthralgia, joint swelling or pain  Neurologic  No weakness, numbness, change in gait,  Psychology  No depression, anxiety, insomnia.   Vitals:  Blood pressure (!) 134/98, pulse 96, temperature (!) 97.3 F (36.3 C), temperature source Tympanic, resp. rate 20, height 5\' 2"  (1.575 m), weight 200 lb 12.8 oz (91.1 kg), SpO2 100 %.  Physical Exam: WD in NAD Neck  Supple NROM, without any enlargements.  Lymph Node Survey No cervical supraclavicular or inguinal adenopathy Cardiovascular  Pulse normal rate, regularity and rhythm. S1 and S2 normal.  Lungs  Clear to auscultation bilateraly, without wheezes/crackles/rhonchi. Good air movement.  Skin  No rash/lesions/breakdown  Psychiatry  Alert and oriented to person, place, and time  Abdomen  Normoactive bowel sounds, abdomen soft, non-tender and obese without evidence of hernia. Incision well healed, no fluctuance or drainage, no cellulitis, induration of inferior aspect. Back No CVA tenderness Genito Urinary   Vaginal cuff: Intact with no lesions drainage.  Suture material present Rectal  deferred Extremities  No bilateral cyanosis, clubbing or edema.  , MD  06/04/2020, 2:35 PM

## 2020-06-04 ENCOUNTER — Inpatient Hospital Stay (HOSPITAL_BASED_OUTPATIENT_CLINIC_OR_DEPARTMENT_OTHER): Payer: Self-pay | Admitting: Gynecologic Oncology

## 2020-06-04 ENCOUNTER — Other Ambulatory Visit: Payer: Self-pay

## 2020-06-04 ENCOUNTER — Encounter: Payer: Self-pay | Admitting: Gynecologic Oncology

## 2020-06-04 VITALS — BP 134/98 | HR 96 | Temp 97.3°F | Resp 20 | Ht 62.0 in | Wt 200.8 lb

## 2020-06-04 DIAGNOSIS — Z90722 Acquired absence of ovaries, bilateral: Secondary | ICD-10-CM

## 2020-06-04 DIAGNOSIS — D484 Neoplasm of uncertain behavior of peritoneum: Secondary | ICD-10-CM

## 2020-06-04 DIAGNOSIS — Z90710 Acquired absence of both cervix and uterus: Secondary | ICD-10-CM

## 2020-06-04 DIAGNOSIS — D201 Benign neoplasm of soft tissue of peritoneum: Secondary | ICD-10-CM

## 2020-06-04 NOTE — Patient Instructions (Signed)
Plan to follow up with Dr. Erin Fulling for continued well woman care. Please call for any concerns or questions related to your surgery.

## 2020-06-26 ENCOUNTER — Telehealth: Payer: Self-pay | Admitting: Medical

## 2020-06-26 MED ORDER — AMLODIPINE BESYLATE 10 MG PO TABS: 10.0000 mg | ORAL_TABLET | Freq: Every day | ORAL | 3 refills | Status: DC

## 2020-06-26 NOTE — Telephone Encounter (Signed)
Patient is requesting a refill : amLODipine (NORVASC) 5 MG tablet   Patient states medication was suppose to be increased to 10MG  .   Patient states medication was refill in December 2021 and she doubled up on medication and now is out.   Patient would also like a 90day supply

## 2020-06-26 NOTE — Telephone Encounter (Signed)
Would you ask pt up appt for bp check by march. Also she request refill of amlodipine higher dose.   Ask what has been bp readiings recently.   I had thought she needed to be on higher. So want to know if working.  Note did already send in 10 mg dose.

## 2020-06-29 NOTE — Telephone Encounter (Signed)
Patient states she has not been checking her BP lately , and nurse visit scheduled for 08/04/20

## 2020-08-04 ENCOUNTER — Telehealth: Payer: Self-pay | Admitting: Medical

## 2020-08-04 ENCOUNTER — Ambulatory Visit (INDEPENDENT_AMBULATORY_CARE_PROVIDER_SITE_OTHER): Payer: No Typology Code available for payment source

## 2020-08-04 ENCOUNTER — Other Ambulatory Visit: Payer: Self-pay

## 2020-08-04 VITALS — BP 118/82 | HR 87

## 2020-08-04 DIAGNOSIS — I10 Essential (primary) hypertension: Secondary | ICD-10-CM

## 2020-08-04 NOTE — Telephone Encounter (Signed)
I was trying to forward back answers to pt blood pressure questions and iron questions. Can you see response?

## 2020-08-04 NOTE — Progress Notes (Addendum)
Pt here for Blood pressure check per Percell Miller Saguier  Pt currently takes: amlodipine 10 mg, losartan 100 mg   Was advised to hold off on chlorthalidone 25mg   Pt reports compliance with medication.   BP today @ = 118/82 HR = 87    Please advise on BP.   She also states she has been taking IRON since surgery and would like to know if she is to continue that?   Bp in good range. Continue amlodipine and losartan. If has been off chlorthalidone then stay off since controlled without.  Schedule pt for cbc and iron level. Then decide if can stop iron.  Mackie Pai, PA-C

## 2020-08-04 NOTE — Telephone Encounter (Signed)
Yes. I have received message and sent patient mychart message with instructions as her request.

## 2020-10-07 ENCOUNTER — Telehealth (INDEPENDENT_AMBULATORY_CARE_PROVIDER_SITE_OTHER): Payer: No Typology Code available for payment source | Admitting: Psychiatry

## 2020-10-07 ENCOUNTER — Encounter (HOSPITAL_COMMUNITY): Payer: Self-pay | Admitting: Psychiatry

## 2020-10-07 ENCOUNTER — Other Ambulatory Visit: Payer: Self-pay

## 2020-10-07 DIAGNOSIS — F411 Generalized anxiety disorder: Secondary | ICD-10-CM

## 2020-10-07 MED ORDER — FLUOXETINE HCL 10 MG PO CAPS: 20.0000 mg | ORAL_CAPSULE | Freq: Every day | ORAL | 2 refills | Status: DC
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
## 2020-10-27 ENCOUNTER — Ambulatory Visit (INDEPENDENT_AMBULATORY_CARE_PROVIDER_SITE_OTHER): Payer: No Typology Code available for payment source | Admitting: Medical

## 2020-10-27 ENCOUNTER — Ambulatory Visit: Payer: No Typology Code available for payment source | Admitting: Family

## 2020-10-27 ENCOUNTER — Encounter: Payer: Self-pay | Admitting: Medical

## 2020-10-27 ENCOUNTER — Other Ambulatory Visit: Payer: Self-pay

## 2020-10-27 VITALS — BP 140/80 | HR 95 | Temp 98.8°F | Resp 20 | Ht 62.0 in | Wt 214.0 lb

## 2020-10-27 DIAGNOSIS — J029 Acute pharyngitis, unspecified: Secondary | ICD-10-CM

## 2020-10-27 DIAGNOSIS — I10 Essential (primary) hypertension: Secondary | ICD-10-CM

## 2020-10-27 DIAGNOSIS — D649 Anemia, unspecified: Secondary | ICD-10-CM

## 2020-10-27 LAB — POCT RAPID STREP A (OFFICE): Rapid Strep A Screen: NEGATIVE

## 2020-10-27 MED ORDER — LOSARTAN POTASSIUM 100 MG PO TABS: 100.0000 mg | ORAL_TABLET | Freq: Every day | ORAL | 1 refills | Status: DC

## 2020-10-27 MED ORDER — AZITHROMYCIN 250 MG PO TABS: ORAL_TABLET | ORAL | 0 refills | Status: AC

## 2020-10-27 MED ORDER — AMLODIPINE BESYLATE 10 MG PO TABS: 10.0000 mg | ORAL_TABLET | Freq: Every day | ORAL | 3 refills | Status: DC

## 2020-10-27 NOTE — Patient Instructions (Signed)
Recent onset of tonsillar persevere with exudate and enlarged submandibular nodes.  Rapid strep test was negative.  Send out test is pending.  Decided to go ahead and prescribe azithromycin antibiotic.   Will follow send out culture and notify you of the result.  If signs symptoms worsen or change such as persisting sore throat and fatigue then would do labs to evaluate for mono.  Blood pressure borderline elevated today.  This is BP without losartan today.  Refilled both losartan and amlodipine today.  History of anemia thought secondary to fibroids.  Want to repeat CBC and iron level now that you have had your surgery.  Verify hemoglobin hematocrit and iron improved.  Follow-up in 7 to 10 days or as needed.

## 2020-10-27 NOTE — Progress Notes (Signed)
Subjective:    Patient ID: Traci Frank, female    DOB: 1981/03/23, 40 y.o.   MRN: [id number]  HPI  Pt has slight swollen tonsils. With mild slight white dc. Symptoms for 4-5 days. Pt did salt water gargles, corcidine and zyrtec for 5 days.   No fever, no chills, no sweats or body aches. No change in smell.  No close contacts with strep.  Pt has good energy/not fatigued no abdomen pain.  BP borderline today.  Patient ran out of losartan recently.  Needs refill of both medications.  History of anemia and plan to repeat hemoglobin and hematocrit after she had her GYN surgery for fibroids.  Patient realized that she never got the blood work done.    Review of Systems  Constitutional: Negative for chills, fatigue and fever.  HENT: Positive for sore throat.        See HPI on throat  Respiratory: Negative for cough, choking and wheezing.   Cardiovascular: Negative for chest pain and palpitations.  Musculoskeletal: Negative for back pain and myalgias.  Skin: Negative for rash.  Neurological: Negative for dizziness and headaches.  Hematological: Positive for adenopathy.  Psychiatric/Behavioral: Negative for behavioral problems and confusion.    Past Medical History:  Diagnosis Date  . Abnormal involuntary movements(781.0)    Right hand  . Allergy   . Anemia   . Anxiety   . Depression   . Disturbance of skin sensation    Right arm  . Headache(784.0)   . Hypertension   . Tingling sensation 05/27/2013  . Tremor of unknown origin 05/27/2013  . Uterine fibroid   . Vertigo      Social History   Socioeconomic History  . Marital status: Single    Spouse name: Not on file  . Number of children: Not on file  . Years of education: Not on file  . Highest education level: Not on file  Occupational History  . Occupation: call center rep    Employer: [employer]  Tobacco Use  . Smoking status: Never Smoker  . Smokeless tobacco: Never Used  Vaping Use  . Vaping Use: Never  used  Substance and Sexual Activity  . Alcohol use: Yes    Alcohol/week: 0.0 standard drinks    Comment: rarely  . Drug use: No  . Sexual activity: Yes    Partners: Male    Birth control/protection: Condom  Other Topics Concern  . Not on file  Social History Narrative  . Not on file   Social Determinants of Health   Financial Resource Strain: Not on file  Food Insecurity: Not on file  Transportation Needs: Not on file  Physical Activity: Not on file  Stress: Not on file  Social Connections: Not on file  Intimate Partner Violence: Not on file    Past Surgical History:  Procedure Laterality Date  . HERNIA REPAIR    . HYSTERECTOMY ABDOMINAL WITH SALPINGECTOMY Bilateral 05/07/2020   Procedure: EXPLORATORY LAPAROTOMY,TOTAL HYSTERECTOMY ABDOMINAL WITH BILATERAL  SALPINGECTOMY GREATER THAN 250GM;  Surgeon: Everitt Amber, MD;  Location: [facility];  Service: Gynecology;  Laterality: Bilateral;  . MYOMECTOMY    . OMENTECTOMY N/A 05/07/2020   Procedure: OMENTECTOMY, RADICAL TUMOR DEBULKING;  Surgeon: Everitt Amber, MD;  Location: [facility];  Service: Gynecology;  Laterality: N/A;    Family History  Problem Relation Age of Onset  . Hypertension Mother   . Drug abuse Father   . Diabetes Maternal Grandmother   . Diabetes Maternal Grandfather   .  Stroke Paternal Grandfather   . Bipolar disorder Cousin     Allergies  Allergen Reactions  . Latex Itching    Current Outpatient Medications on File Prior to Visit  Medication Sig Dispense Refill  . cetirizine (ZYRTEC) 10 MG tablet Take 10 mg by mouth daily in the afternoon.     . ferrous gluconate (FERGON) 324 MG tablet Take 1 tablet (324 mg total) by mouth 3 (three) times daily with meals. 90 tablet 3  . FLUoxetine (PROZAC) 10 MG capsule Take 2 capsules (20 mg total) by mouth daily. 60 capsule 2  . ibuprofen (ADVIL) 600 MG tablet Take 1 tablet (600 mg total) by mouth every 8 (eight) hours as needed for moderate pain. For AFTER surgery only. Use  sparingly as needed. 30 tablet 0  . linaCLOtide (LINZESS PO) Take 1 tablet by mouth daily as needed.    Marland Kitchen LORazepam (ATIVAN) 0.5 MG tablet Take 0.5 mg by mouth every 8 (eight) hours as needed for anxiety.    . Multiple Vitamin (THERA) TABS Take 1 tablet by mouth daily.    . ondansetron (ZOFRAN ODT) 4 MG disintegrating tablet Take 1 tablet (4 mg total) by mouth every 4 (four) hours as needed for nausea or vomiting. 20 tablet 0  . oxyCODONE (OXY IR/ROXICODONE) 5 MG immediate release tablet Take 1 tablet (5 mg total) by mouth every 4 (four) hours as needed for severe pain. For AFTER surgery only, do not take and drive 20 tablet 0  . oxyCODONE-acetaminophen (PERCOCET) 5-325 MG tablet Take 1 tablet by mouth every 6 (six) hours as needed. 20 tablet 0  . enoxaparin (LOVENOX) 40 MG/0.4ML injection Inject 0.4 mLs (40 mg total) into the skin daily for 28 doses. For AFTER surgery only 11.2 mL 0   No current facility-administered medications on file prior to visit.    BP 140/80   Pulse 95   Temp 98.8 F (37.1 C)   Resp 20   Ht 5\' 2"  (1.575 m)   Wt 214 lb (97.1 kg)   SpO2 99%   BMI 39.14 kg/m       Objective:   Physical Exam  General  Mental Status - Alert. General Appearance - Well groomed. Not in acute distress.  Skin Rashes- No Rashes.  HEENT Head- Normal. Ear Auditory Canal - Left- Normal. Right - Normal.Tympanic Membrane- Left- Normal. Right- Normal. Eye Sclera/Conjunctiva- Left- Normal. Right- Normal. Nose & Sinuses Nasal Mucosa- Left-  Boggy and Congested. Right-  Boggy and  Congested.Bilateral no  maxillary and no  frontal sinus pressure. Mouth & Throat Lips: Upper Lip- Normal: no dryness, cracking, pallor, cyanosis, or vesicular eruption. Lower Lip-Normal: no dryness, cracking, pallor, cyanosis or vesicular eruption. Buccal Mucosa- Bilateral- No Aphthous ulcers. Oropharynx- No Discharge or Erythema. Tonsils: Characteristics- Bilateral-mild erythema or Congestion.  Size/Enlargement- Bilateral-2+ enlargement. Discharge-tonsillar crypts have bilateral mild white exudate.  Neck Neck- Supple. No Masses.  Submandibular nodes mildly enlarged and tender to palpation.   Chest and Lung Exam Auscultation: Breath Sounds:-Clear even and unlabored.  Cardiovascular Auscultation:Rythm- Regular, rate and rhythm. Murmurs & Other Heart Sounds:Ausculatation of the heart reveal- No Murmurs.  Lymphatic Head & Neck General Head & Neck Lymphatics: Bilateral: Description-see neck exam.  Abdomen-soft, nontender, nondistended positive bowel sounds, no rebound or guarding.  No organomegaly.    Assessment & Plan:  Recent onset of tonsillar persevere with exudate and enlarged submandibular nodes.  Rapid strep test was negative.  Send out test is pending.  Decided to go ahead  and prescribe azithromycin antibiotic.   Will follow send out culture and notify you of the result.  If signs symptoms worsen or change such as persisting sore throat and fatigue then would do labs to evaluate for mono.  Blood pressure borderline elevated today.  This is BP without losartan today.  Refilled both losartan and amlodipine today.  History of anemia thought secondary to fibroids.  Want to repeat CBC and iron level now that you have had your surgery.  Verify hemoglobin hematocrit and iron improved.  Follow-up in 7 to 10 days or as needed.

## 2020-10-28 ENCOUNTER — Telehealth (HOSPITAL_COMMUNITY): Payer: Self-pay

## 2020-10-28 NOTE — Telephone Encounter (Signed)
Medication managemnet - Telephone call with pt to inform Dr. Einar Grad that was going to be taking over her care temporarily following the retirement of Dr. Montel Culver until a more permanent provider could be located was now not going to be able to continue assist after 11/03/20.  Informed at this time we did not have other providers that could absorb Dr. Audria Nine caseload so we would be referring patient out to other medication management practices.  Informed patient she should be receiving a letter in the mail with potential alternative options or patient could locate her own future care and could even discuss with her PCP to take over medications.  Verified patient only one restarted Prozac and still has at least 2 refills of medications.  No concerns at this time expressed by patient as she was understanding of the issue and would be seeking out her next treatment provider.  Patient to call back during 30 day transition period of any problems.

## 2020-10-29 LAB — CULTURE, GROUP A STREP
MICRO NUMBER:: 11928241
SPECIMEN QUALITY:: ADEQUATE

## 2020-10-30 ENCOUNTER — Telehealth: Payer: Self-pay | Admitting: Medical

## 2020-10-30 ENCOUNTER — Encounter: Payer: Self-pay | Admitting: Medical

## 2020-10-30 MED ORDER — CEFDINIR 300 MG PO CAPS: 300.0000 mg | ORAL_CAPSULE | Freq: Two times a day (BID) | ORAL | 0 refills | Status: DC

## 2020-10-30 NOTE — Telephone Encounter (Signed)
Rx cefdnir sent to pt pharmacy.

## 2020-11-05 ENCOUNTER — Telehealth (HOSPITAL_COMMUNITY): Payer: Self-pay | Admitting: Psychiatry

## 2020-11-06 ENCOUNTER — Ambulatory Visit (INDEPENDENT_AMBULATORY_CARE_PROVIDER_SITE_OTHER): Payer: No Typology Code available for payment source | Admitting: Medical

## 2020-11-06 ENCOUNTER — Other Ambulatory Visit: Payer: Self-pay

## 2020-11-06 VITALS — BP 134/81 | HR 88 | Resp 18 | Ht 62.0 in | Wt 211.0 lb

## 2020-11-06 DIAGNOSIS — J028 Acute pharyngitis due to other specified organisms: Secondary | ICD-10-CM

## 2020-11-06 DIAGNOSIS — K219 Gastro-esophageal reflux disease without esophagitis: Secondary | ICD-10-CM

## 2020-11-06 DIAGNOSIS — D649 Anemia, unspecified: Secondary | ICD-10-CM

## 2020-11-06 MED ORDER — FAMOTIDINE 20 MG PO TABS: 20.0000 mg | ORAL_TABLET | Freq: Every day | ORAL | 0 refills | Status: DC

## 2020-11-06 NOTE — Patient Instructions (Addendum)
Your tonsils are still large but no exudate. Throat culture was negative and you did take azithromycin. Can continue cefdnir.   Will get epstein barr antibody studies next week.  For anemia get iron and cbc next week.  For reflux rx famotadine.   Will refer to ENT to evaluate tonsils and possible reflux toward larynx.  Follow up 2 weeks or as needed

## 2020-11-06 NOTE — Progress Notes (Signed)
Subjective:    Patient ID: Traci Frank, female    DOB: 1981/02/13, 40 y.o.   MRN: [id number]  HPI  Pt in for follow up. See last visit. Pt did take antibiotic. Last visit had large tonsils with crypts filled with some white dc. I had prescribed azithromycin. Now she states no DC but still large tonsils. No pain. Just feels like swollen. Pt rapid and throat culture was negative. Pt never had mono in past.   Pt states when she was younger would get tonsillitis when she cleaned and dusted. But she states for years was not swollen.   Pt does notes some hx of reflux. Sometimes rarely feels like might get to nose area. Not taking anything for heart burn.    Hx of anemia.    Review of Systems  Constitutional: Negative for chills, fatigue and fever.  Respiratory: Negative for cough, chest tightness, shortness of breath and wheezing.   Cardiovascular: Negative for chest pain and palpitations.  Gastrointestinal: Negative for abdominal distention, abdominal pain, blood in stool and constipation.  Genitourinary: Negative for dysuria.  Musculoskeletal: Negative for back pain and myalgias.  Skin: Negative for rash.  Neurological: Negative for dizziness, speech difficulty, weakness, numbness and headaches.  Hematological: Negative for adenopathy. Does not bruise/bleed easily.  Psychiatric/Behavioral: Negative for behavioral problems, confusion and decreased concentration.     Past Medical History:  Diagnosis Date  . Abnormal involuntary movements(781.0)    Right hand  . Allergy   . Anemia   . Anxiety   . Depression   . Disturbance of skin sensation    Right arm  . Headache(784.0)   . Hypertension   . Tingling sensation 05/27/2013  . Tremor of unknown origin 05/27/2013  . Uterine fibroid   . Vertigo      Social History   Socioeconomic History  . Marital status: Single    Spouse name: Not on file  . Number of children: Not on file  . Years of education: Not on file  .  Highest education level: Not on file  Occupational History  . Occupation: call center rep    Employer: [employer]  Tobacco Use  . Smoking status: Never Smoker  . Smokeless tobacco: Never Used  Vaping Use  . Vaping Use: Never used  Substance and Sexual Activity  . Alcohol use: Yes    Alcohol/week: 0.0 standard drinks    Comment: rarely  . Drug use: No  . Sexual activity: Yes    Partners: Male    Birth control/protection: Condom  Other Topics Concern  . Not on file  Social History Narrative  . Not on file   Social Determinants of Health   Financial Resource Strain: Not on file  Food Insecurity: Not on file  Transportation Needs: Not on file  Physical Activity: Not on file  Stress: Not on file  Social Connections: Not on file  Intimate Partner Violence: Not on file    Past Surgical History:  Procedure Laterality Date  . HERNIA REPAIR    . HYSTERECTOMY ABDOMINAL WITH SALPINGECTOMY Bilateral 05/07/2020   Procedure: EXPLORATORY LAPAROTOMY,TOTAL HYSTERECTOMY ABDOMINAL WITH BILATERAL  SALPINGECTOMY GREATER THAN 250GM;  Surgeon: Everitt Amber, MD;  Location: [facility];  Service: Gynecology;  Laterality: Bilateral;  . MYOMECTOMY    . OMENTECTOMY N/A 05/07/2020   Procedure: OMENTECTOMY, RADICAL TUMOR DEBULKING;  Surgeon: Everitt Amber, MD;  Location: [facility];  Service: Gynecology;  Laterality: N/A;    Family History  Problem Relation Age of  Onset  . Hypertension Mother   . Drug abuse Father   . Diabetes Maternal Grandmother   . Diabetes Maternal Grandfather   . Stroke Paternal Grandfather   . Bipolar disorder Cousin     Allergies  Allergen Reactions  . Latex Itching    Current Outpatient Medications on File Prior to Visit  Medication Sig Dispense Refill  . amLODipine (NORVASC) 10 MG tablet Take 1 tablet (10 mg total) by mouth daily. 90 tablet 3  . cefdinir (OMNICEF) 300 MG capsule Take 1 capsule (300 mg total) by mouth 2 (two) times daily. 20 capsule 0  . cetirizine (ZYRTEC) 10  MG tablet Take 10 mg by mouth daily in the afternoon.     . ferrous gluconate (FERGON) 324 MG tablet Take 1 tablet (324 mg total) by mouth 3 (three) times daily with meals. 90 tablet 3  . FLUoxetine (PROZAC) 10 MG capsule Take 2 capsules (20 mg total) by mouth daily. 60 capsule 2  . ibuprofen (ADVIL) 600 MG tablet Take 1 tablet (600 mg total) by mouth every 8 (eight) hours as needed for moderate pain. For AFTER surgery only. Use sparingly as needed. 30 tablet 0  . linaCLOtide (LINZESS PO) Take 1 tablet by mouth daily as needed.    Marland Kitchen LORazepam (ATIVAN) 0.5 MG tablet Take 0.5 mg by mouth every 8 (eight) hours as needed for anxiety.    Marland Kitchen losartan (COZAAR) 100 MG tablet Take 1 tablet (100 mg total) by mouth daily. 90 tablet 1  . Multiple Vitamin (THERA) TABS Take 1 tablet by mouth daily.    . ondansetron (ZOFRAN ODT) 4 MG disintegrating tablet Take 1 tablet (4 mg total) by mouth every 4 (four) hours as needed for nausea or vomiting. 20 tablet 0  . oxyCODONE (OXY IR/ROXICODONE) 5 MG immediate release tablet Take 1 tablet (5 mg total) by mouth every 4 (four) hours as needed for severe pain. For AFTER surgery only, do not take and drive 20 tablet 0  . oxyCODONE-acetaminophen (PERCOCET) 5-325 MG tablet Take 1 tablet by mouth every 6 (six) hours as needed. 20 tablet 0  . enoxaparin (LOVENOX) 40 MG/0.4ML injection Inject 0.4 mLs (40 mg total) into the skin daily for 28 doses. For AFTER surgery only 11.2 mL 0   No current facility-administered medications on file prior to visit.    BP 134/81   Pulse 88   Resp 18   Ht 5\' 2"  (1.575 m)   Wt 211 lb (95.7 kg)   SpO2 99%   BMI 38.59 kg/m       Objective:   Physical Exam  General- No acute distress. Pleasant patient. Neck- Full range of motion, no jvd Lungs- Clear, even and unlabored. Heart- regular rate and rhythm. Neurologic- CNII- XII grossly intact. heent- moderate large tonsils. No exudate. No tonsil hypertrophy.      Assessment & Plan:   Your tonsils are still large but no exudate. Throat culture was negative and you did take azithromycin. Can continue cefdnir.   Will get epstein barr antibody studies next week.  For anemia get iron and cbc next week.  For reflux rx famotadine.   Will refer to ENT to evaluate tonsils and possible reflux toward larynx.  Follow up 2 weeks or as needed   Mackie Pai, PA-C   Time spent with patient today was 32  minutes which consisted of chart review, discussing diagnosis, work up, treatment and documentation.

## 2021-03-22 ENCOUNTER — Telehealth: Payer: Self-pay | Admitting: Medical

## 2021-03-22 NOTE — Telephone Encounter (Signed)
Scheduled an appt for 10/18 at 420

## 2021-03-22 NOTE — Telephone Encounter (Signed)
Pt stated meds (amLODipine (NORVASC) 10 MG tablet )are causing her feer to swell and would like adjustments made to her meds.Please advise.

## 2021-03-23 ENCOUNTER — Other Ambulatory Visit: Payer: Self-pay

## 2021-03-23 ENCOUNTER — Encounter: Payer: Self-pay | Admitting: Medical

## 2021-03-23 ENCOUNTER — Ambulatory Visit (INDEPENDENT_AMBULATORY_CARE_PROVIDER_SITE_OTHER): Payer: Managed Care, Other (non HMO) | Admitting: Medical

## 2021-03-23 VITALS — BP 137/88 | HR 99 | Temp 98.0°F | Resp 18 | Ht 62.0 in | Wt 220.0 lb

## 2021-03-23 DIAGNOSIS — I10 Essential (primary) hypertension: Secondary | ICD-10-CM | POA: Diagnosis not present

## 2021-03-23 DIAGNOSIS — R2242 Localized swelling, mass and lump, left lower limb: Secondary | ICD-10-CM | POA: Diagnosis not present

## 2021-03-23 DIAGNOSIS — R600 Localized edema: Secondary | ICD-10-CM

## 2021-03-23 DIAGNOSIS — E010 Iodine-deficiency related diffuse (endemic) goiter: Secondary | ICD-10-CM | POA: Diagnosis not present

## 2021-03-23 DIAGNOSIS — R739 Hyperglycemia, unspecified: Secondary | ICD-10-CM | POA: Diagnosis not present

## 2021-03-23 NOTE — Patient Instructions (Addendum)
Hypertension with BP being relatively well controlled today.  On both losartan 100 mg daily and amlodipine 10 mg daily in the past blood pressure was worse.  Just recently noticed mild to moderate bilateral pedal edema with 9 pound weight gain today compared to last visit weight 4 months ago.  With bilateral lower extremity edema and 9 pound weight gain we will get chest x-ray, BNP, CMP and bilateral lower extremity ultrasounds.  If above work-up is negative will advise on conservative measures for dependent edema and discontinue amlodipine.  Would replace with hydralazine.  Note prior reported side effects with diuretic.  Thyromegaly on exam.  Will get TSH, T4 and thyroid ultrasound.  On review of prior labs sugar has been mildly elevated.  Will get A1c with lab work today.  Follow-up date to be determined after lab review.

## 2021-03-23 NOTE — Addendum Note (Signed)
Addended by: Anabel Halon on: 03/23/2021 05:40 PM   Modules accepted: Orders

## 2021-03-23 NOTE — Progress Notes (Signed)
   Subjective:    Patient ID: Traci Frank, female    DOB: 08/25/1980, 40 y.o.   MRN: [id number]  HPI  Pt in with bp 137/88.  Pt is on losartan 100 mg daily and  amlodipine 10 mg daily.  Pt started to get some lower ext swelling for about 1.5 months.   Pt thinks mid sob. States got covid in July. She was vaccinated and boosted. No wheezing.   According to our scale weight up about 9 lbs.     Review of Systems  Constitutional:  Positive for unexpected weight change. Negative for chills, fatigue and fever.  HENT:  Negative for dental problem.   Respiratory:  Negative for cough, chest tightness, shortness of breath and wheezing.   Cardiovascular:  Negative for chest pain and palpitations.  Gastrointestinal:  Negative for abdominal pain, blood in stool and constipation.  Genitourinary:  Negative for dysuria, enuresis, flank pain and frequency.  Musculoskeletal:  Negative for back pain.  Neurological:  Negative for dizziness and headaches.  Hematological:  Negative for adenopathy. Does not bruise/bleed easily.  Psychiatric/Behavioral:  Negative for behavioral problems and confusion.       Objective:   Physical Exam  General- No acute distress. Pleasant patient. Neck- Full range of motion, no jvd.  Palpation and inspection appears that her thyroid is symmetrically enlarged. Lungs- Clear, even and unlabored. Heart- regular rate and rhythm. Neurologic- CNII- XII grossly intact.  Lower extremity-bilateral 1+ pedal edema.  Calf symmetric.  Negative Homans' sign.      Assessment & Plan:   Patient Instructions  Hypertension with BP being relatively well controlled today.  On both losartan 100 mg daily and amlodipine 10 mg daily in the past blood pressure was worse.  Just recently noticed mild to moderate bilateral pedal edema with 9 pound weight gain today compared to last visit weight 4 months ago.  With bilateral lower extremity edema and 9 pound weight gain we will get chest  x-ray, BNP, CMP and bilateral lower extremity ultrasounds.  If above work-up is negative will advise on conservative measures for dependent edema and discontinue amlodipine.  Would replace with hydralazine.  Note prior reported side effects with diuretic.  Thyromegaly on exam.  Will get TSH, T4 and thyroid ultrasound.  On review of prior labs sugar has been mildly elevated.  Will get A1c with lab work today.  Follow-up date to be determined after lab review.

## 2021-03-24 ENCOUNTER — Ambulatory Visit (HOSPITAL_BASED_OUTPATIENT_CLINIC_OR_DEPARTMENT_OTHER)
Admission: RE | Admit: 2021-03-24 | Discharge: 2021-03-24 | Disposition: A | Discharge status: Home / Self Care | Payer: Managed Care, Other (non HMO) | Source: Ambulatory Visit | Attending: Medical | Admitting: Medical

## 2021-03-24 ENCOUNTER — Encounter: Payer: Self-pay | Admitting: Medical

## 2021-03-24 ENCOUNTER — Telehealth: Payer: Self-pay | Admitting: Medical

## 2021-03-24 DIAGNOSIS — R2242 Localized swelling, mass and lump, left lower limb: Secondary | ICD-10-CM

## 2021-03-24 DIAGNOSIS — R600 Localized edema: Secondary | ICD-10-CM | POA: Diagnosis present

## 2021-03-24 DIAGNOSIS — E010 Iodine-deficiency related diffuse (endemic) goiter: Secondary | ICD-10-CM | POA: Insufficient documentation

## 2021-03-24 LAB — T4, FREE: Free T4: 0.6 ng/dL (ref 0.60–1.60)

## 2021-03-24 LAB — COMPREHENSIVE METABOLIC PANEL
ALT: 15 U/L (ref 0–35)
AST: 16 U/L (ref 0–37)
Albumin: 4.1 g/dL (ref 3.5–5.2)
Alkaline Phosphatase: 71 U/L (ref 39–117)
BUN: 12 mg/dL (ref 6–23)
CO2: 28 mEq/L (ref 19–32)
Calcium: 9.2 mg/dL (ref 8.4–10.5)
Chloride: 102 mEq/L (ref 96–112)
Creatinine, Ser: 0.87 mg/dL (ref 0.40–1.20)
GFR: 83.96 mL/min (ref >60.00)
Glucose, Bld: 91 mg/dL (ref 70–99)
Potassium: 4.2 mEq/L (ref 3.5–5.1)
Sodium: 137 mEq/L (ref 135–145)
Total Bilirubin: 0.8 mg/dL (ref 0.2–1.2)
Total Protein: 7.2 g/dL (ref 6.0–8.3)

## 2021-03-24 LAB — HEMOGLOBIN A1C: Hgb A1c MFr Bld: 5.7 % (ref 4.6–6.5)

## 2021-03-24 LAB — TSH: TSH: 0.97 u[IU]/mL (ref 0.35–5.50)

## 2021-03-24 LAB — BRAIN NATRIURETIC PEPTIDE: Pro B Natriuretic peptide (BNP): 11 pg/mL (ref 0.0–100.0)

## 2021-03-24 MED ORDER — HYDRALAZINE HCL 25 MG PO TABS: 25.0000 mg | ORAL_TABLET | Freq: Three times a day (TID) | ORAL | 0 refills | Status: DC

## 2021-03-24 NOTE — Telephone Encounter (Signed)
Chart opened to rx med, order lab, review chart, respond to my chart message or send message to staff member  

## 2021-06-10 ENCOUNTER — Other Ambulatory Visit: Payer: Self-pay

## 2021-06-10 ENCOUNTER — Ambulatory Visit
Admission: EM | Admit: 2021-06-10 | Discharge: 2021-06-10 | Disposition: A | Discharge status: Home / Self Care | Payer: Managed Care, Other (non HMO) | Attending: Physician Assistant | Admitting: Physician Assistant

## 2021-06-10 DIAGNOSIS — J9801 Acute bronchospasm: Secondary | ICD-10-CM

## 2021-06-10 DIAGNOSIS — R051 Acute cough: Secondary | ICD-10-CM

## 2021-06-10 DIAGNOSIS — J069 Acute upper respiratory infection, unspecified: Secondary | ICD-10-CM | POA: Diagnosis not present

## 2021-06-10 LAB — POCT INFLUENZA A/B
Influenza A, POC: NEGATIVE
Influenza B, POC: NEGATIVE

## 2021-06-10 MED ORDER — METHYLPREDNISOLONE ACETATE 40 MG/ML IJ SUSP: 60.0000 mg | Freq: Once | INTRAMUSCULAR | Status: DC

## 2021-06-10 MED ORDER — ALBUTEROL SULFATE HFA 108 (90 BASE) MCG/ACT IN AERS
2.0000 | INHALATION_SPRAY | Freq: Once | RESPIRATORY_TRACT | Status: AC
  Administered 2021-06-10: 2 via RESPIRATORY_TRACT

## 2021-06-10 MED ORDER — METHYLPREDNISOLONE SODIUM SUCC 125 MG IJ SOLR
60.0000 mg | Freq: Once | INTRAMUSCULAR | Status: AC
  Administered 2021-06-10: 60 mg via INTRAMUSCULAR

## 2021-06-10 MED ORDER — PREDNISONE 20 MG PO TABS: 40.0000 mg | ORAL_TABLET | Freq: Every day | ORAL | 0 refills | Status: AC

## 2021-06-10 MED ORDER — PROMETHAZINE-DM 6.25-15 MG/5ML PO SYRP: 5.0000 mL | ORAL_SOLUTION | Freq: Three times a day (TID) | ORAL | 0 refills | Status: DC | PRN

## 2021-06-10 NOTE — Discharge Instructions (Addendum)
Your flu test was negative.  We will contact you if your COVID test is positive.  Use albuterol every 4-6 hours as needed for shortness of breath.  Start prednisone burst (40 mg x 4 days) and do not take NSAIDs with this medication as can cause stomach bleeding including aspirin, ibuprofen/Advil, naproxen/Aleve.  You can use Tylenol, Mucinex, Flonase for symptom relief.  Use Promethazine DM for cough.  This can make you sleepy so do not drive or drink alcohol while taking it.  If you have any worsening symptoms including shortness of breath, high fever, nausea/vomiting interfering with oral intake, chest pain, worsening cough be seen immediately.  If symptoms or not improving by next week please return here see her PCP.

## 2021-06-10 NOTE — ED Triage Notes (Signed)
Pt states two days ago she began having a sore throat, headache and not being able to keep food down.

## 2021-06-10 NOTE — ED Provider Notes (Signed)
[facility]    CSN: [id number] Arrival date & time: 06/10/21  0843      History   Chief Complaint No chief complaint on file.   HPI Traci Frank is a 41 y.o. female.   Patient presents today with a 2-day history of URI symptoms.  Reports fever, sore throat, headache, nausea, cough, nasal congestion.  Denies any chest pain, shortness of breath, lightheadedness.  Denies any known sick contacts.  She has had COVID-19 vaccinations including boosters as well as flu shot.  Denies any recent antibiotics in the past 90 days.  She does have a history of allergies and has been taking Zyrtec as prescribed without missing doses.  Denies history of asthma, COPD, smoking.  She is confident she is not pregnant she is status post hysterectomy.  She has been using over-the-counter medications including TheraFlu without improvement of symptoms.     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   Obesity (BMI 30-39.9) 03/30/2020   Iron deficiency anemia due to chronic blood loss 03/30/2020   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   Major depressive disorder, recurrent episode, in full remission ([place]) 02/01/2019   GAD (generalized anxiety disorder) 02/01/2019   Panic disorder 02/01/2019   Essential hypertension 07/05/2016   Tingling sensation 05/27/2013   Tremor of unknown origin 05/27/2013    Past Surgical History:  Procedure Laterality Date   HERNIA REPAIR     HYSTERECTOMY ABDOMINAL WITH SALPINGECTOMY Bilateral 05/07/2020    Procedure: EXPLORATORY LAPAROTOMY,TOTAL HYSTERECTOMY ABDOMINAL WITH BILATERAL  SALPINGECTOMY GREATER THAN 250GM;  Surgeon: Everitt Amber, MD;  Location: [facility];  Service: Gynecology;  Laterality: Bilateral;   MYOMECTOMY     OMENTECTOMY N/A 05/07/2020   Procedure: OMENTECTOMY, RADICAL TUMOR DEBULKING;  Surgeon: Everitt Amber, MD;  Location: [facility];  Service: Gynecology;  Laterality: N/A;    OB History     Gravida  0   Para  0   Term  0   Preterm  0   AB  0   Living  0      SAB  0   IAB  0   Ectopic  0   Multiple  0   Live Births  0            Home Medications    Prior to Admission medications   Medication Sig Start Date End Date Taking? Authorizing Provider  predniSONE (DELTASONE) 20 MG tablet Take 2 tablets (40 mg total) by mouth daily for 4 days. 06/10/21 06/14/21 Yes Marcheta Horsey K, PA-C  promethazine-dextromethorphan (PROMETHAZINE-DM) 6.25-15 MG/5ML syrup Take 5 mLs by mouth 3 (three) times daily as needed for cough. 06/10/21  Yes Lysha Schrade K, PA-C  cetirizine (ZYRTEC) 10 MG tablet Take 10 mg by mouth daily in the afternoon.     [provider]  FLUoxetine (PROZAC) 10 MG capsule Take 2 capsules (20 mg total) by mouth daily. 10/07/20 01/05/21  Elvin So, MD  hydrALAZINE (APRESOLINE) 25 MG tablet Take 1 tablet (25 mg total) by mouth 3 (three) times daily. 03/24/21   Saguier, Percell Miller, PA-C  linaCLOtide (LINZESS PO) Take 1 tablet by mouth daily as needed.    [provider]  Multiple Vitamin (THERA) TABS Take 1 tablet by mouth daily.    [provider]    Family History Family History  Problem Relation Age of Onset   Hypertension Mother    Drug abuse Father    Diabetes Maternal Grandmother    Diabetes Maternal Grandfather    Stroke Paternal Grandfather    Bipolar disorder Cousin     Social History Social History   Tobacco Use   Smoking status: Never   Smokeless tobacco: Never  Vaping Use   Vaping Use: Never used  Substance  Use Topics   Alcohol use: Yes    Alcohol/week: 0.0 standard drinks    Comment: rarely   Drug use: No     Allergies   Latex   Review of Systems Review of Systems  Constitutional:  Positive for activity change, appetite change, fatigue and fever.  HENT:  Positive for congestion and sore throat. Negative for sinus pressure and sneezing.   Respiratory:  Positive for cough. Negative for shortness of breath.   Cardiovascular:  Negative for chest pain.  Gastrointestinal:  Positive for nausea. Negative for abdominal pain, diarrhea and vomiting.  Musculoskeletal:  Positive for arthralgias and myalgias.  Neurological:  Positive for headaches. Negative for dizziness and light-headedness.    Physical Exam Triage Vital Signs ED Triage Vitals  Enc Vitals Group     BP 06/10/21 0858 (!) 143/103     Pulse Rate 06/10/21 0858 (!) 103     Resp 06/10/21 0858 20     Temp 06/10/21 0858 100 F (37.8 C)     Temp Source 06/10/21 0858 Oral     SpO2 06/10/21 0858 95 %     Weight --      Height --      Head Circumference --      Peak Flow --      Pain Score 06/10/21 0856 7     Pain Loc --      Pain Edu? --      Excl. in [place]? --    No data found.  Updated Vital Signs BP (!) 143/103 (BP Location: Left Arm)    Pulse (!) 103    Temp 100 F (37.8 C) (Oral)    Resp 20    LMP 04/13/2020    SpO2 95%   Visual Acuity Right Eye Distance:   Left Eye Distance:   Bilateral Distance:    Right Eye Near:   Left Eye Near:    Bilateral Near:     Physical Exam Vitals reviewed.  Constitutional:      General: She is awake. She is not in acute distress.    Appearance: Normal appearance. She is well-developed. She is not ill-appearing.     Comments: Very pleasant female appears stated age in no acute distress sitting comfortably in exam room  HENT:     Head: Normocephalic and atraumatic.     Right Ear: Tympanic membrane, ear canal and external ear normal. Tympanic membrane is not erythematous or bulging.      Left Ear: Ear canal and external ear normal. There is impacted cerumen.     Nose:     Right Sinus: No maxillary sinus tenderness or frontal sinus tenderness.  Left Sinus: No maxillary sinus tenderness or frontal sinus tenderness.     Mouth/Throat:     Pharynx: Uvula midline. Posterior oropharyngeal erythema present. No oropharyngeal exudate.  Cardiovascular:     Rate and Rhythm: Normal rate and regular rhythm.     Heart sounds: Normal heart sounds, S1 normal and S2 normal. No murmur heard. Pulmonary:     Effort: Pulmonary effort is normal.     Breath sounds: Normal breath sounds. No wheezing, rhonchi or rales.     Comments: Widespread wheezing and reactive cough with deep breathing Musculoskeletal:     Right lower leg: No edema.     Left lower leg: No edema.  Psychiatric:        Behavior: Behavior is cooperative.     UC Treatments / Results  Labs (all labs ordered are listed, but only abnormal results are displayed) Labs Reviewed  NOVEL CORONAVIRUS, NAA  POCT INFLUENZA A/B    EKG   Radiology No results found.  Procedures Procedures (including critical care time)  Medications Ordered in UC Medications  albuterol (VENTOLIN HFA) 108 (90 Base) MCG/ACT inhaler 2 puff (has no administration in time range)  methylPREDNISolone sodium succinate (SOLU-MEDROL) 125 mg/2 mL injection 60 mg (has no administration in time range)    Initial Impression / Assessment and Plan / UC Course  I have reviewed the triage vital signs and the nursing notes.  Pertinent labs & imaging results that were available during my care of the patient were reviewed by me and considered in my medical decision making (see chart for details).     Discussed likely viral etiology of symptoms.  No evidence of acute infection that would warrant initiation of antibiotics.  Patient was given 60 mg of Solu-Medrol as well as albuterol with improvement but not resolution of symptoms.  She tested negative for  flu.  COVID test is pending.  Given history of hypertension she does qualify for antiviral medication and is up-to-date on lab work with last CMP showing normal kidney function in October 2022; calculated creatinine clearance of 136.77 mL/min.  She will be a candidate for Paxlovid if she is positive for COVID and interested in starting this medication.  While awaiting results, she was started on prednisone to help with symptoms with instruction not to take additional NSAIDs with this medication.  She was prescribed Promethazine DM for cough with instruction of drive drink alcohol while taking this medication.  She can use Mucinex and Flonase for symptom relief.  Recommended rest and drinking plenty of fluid.  Discussed that if she has any worsening symptoms including high fever, worsening cough, shortness of breath, nausea/vomiting interfering with oral intake she is to go to the emergency room.  Strict return precautions given to which she expressed understanding.  Work excuse note provided.  Final Clinical Impressions(s) / UC Diagnoses   Final diagnoses:  Upper respiratory tract infection, unspecified type  Acute cough  Bronchospasm     Discharge Instructions      Your flu test was negative.  We will contact you if your COVID test is positive.  Use albuterol every 4-6 hours as needed for shortness of breath.  Start prednisone burst (40 mg x 4 days) and do not take NSAIDs with this medication as can cause stomach bleeding including aspirin, ibuprofen/Advil, naproxen/Aleve.  You can use Tylenol, Mucinex, Flonase for symptom relief.  Use Promethazine DM for cough.  This can make you sleepy so do not drive or drink alcohol while taking  it.  If you have any worsening symptoms including shortness of breath, high fever, nausea/vomiting interfering with oral intake, chest pain, worsening cough be seen immediately.  If symptoms or not improving by next week please return here see her PCP.     ED  Prescriptions     Medication Sig Dispense Auth. Provider   promethazine-dextromethorphan (PROMETHAZINE-DM) 6.25-15 MG/5ML syrup Take 5 mLs by mouth 3 (three) times daily as needed for cough. 118 mL Costas Sena K, PA-C   predniSONE (DELTASONE) 20 MG tablet Take 2 tablets (40 mg total) by mouth daily for 4 days. 8 tablet Dedria Endres, Derry Skill, PA-C      PDMP not reviewed this encounter.   Terrilee Croak, PA-C 06/10/21 5146

## 2021-06-11 LAB — NOVEL CORONAVIRUS, NAA: SARS-CoV-2, NAA: NOT DETECTED

## 2021-06-11 LAB — SARS-COV-2, NAA 2 DAY TAT

## 2021-06-15 ENCOUNTER — Telehealth: Payer: Self-pay | Admitting: Medical

## 2021-06-15 NOTE — Telephone Encounter (Signed)
Pt sched ov 1/11 @ 820. Still hoping meds can be prescribed prior to appt. Please advise.

## 2021-06-15 NOTE — Telephone Encounter (Signed)
Pt contact ov needing meds to break down congestion. Pt was recently seen at urgent care on 1/5. No openings today,  please advise.

## 2021-06-16 ENCOUNTER — Encounter: Payer: Self-pay | Admitting: Medical

## 2021-06-16 ENCOUNTER — Ambulatory Visit (HOSPITAL_BASED_OUTPATIENT_CLINIC_OR_DEPARTMENT_OTHER)
Admission: RE | Admit: 2021-06-16 | Discharge: 2021-06-16 | Disposition: A | Discharge status: Home / Self Care | Payer: Managed Care, Other (non HMO) | Source: Ambulatory Visit | Attending: Medical | Admitting: Medical

## 2021-06-16 ENCOUNTER — Telehealth: Payer: Self-pay

## 2021-06-16 ENCOUNTER — Other Ambulatory Visit: Payer: Self-pay

## 2021-06-16 ENCOUNTER — Ambulatory Visit: Payer: Managed Care, Other (non HMO) | Admitting: Medical

## 2021-06-16 VITALS — BP 140/80 | HR 100 | Temp 99.4°F | Resp 18 | Ht 62.0 in | Wt 211.0 lb

## 2021-06-16 DIAGNOSIS — J029 Acute pharyngitis, unspecified: Secondary | ICD-10-CM | POA: Diagnosis not present

## 2021-06-16 DIAGNOSIS — R052 Subacute cough: Secondary | ICD-10-CM | POA: Diagnosis present

## 2021-06-16 DIAGNOSIS — R062 Wheezing: Secondary | ICD-10-CM | POA: Diagnosis present

## 2021-06-16 DIAGNOSIS — J40 Bronchitis, not specified as acute or chronic: Secondary | ICD-10-CM

## 2021-06-16 MED ORDER — FLUTICASONE PROPIONATE 50 MCG/ACT NA SUSP: 2.0000 | Freq: Every day | NASAL | 1 refills | Status: DC

## 2021-06-16 MED ORDER — HYDROCODONE BIT-HOMATROP MBR 5-1.5 MG/5ML PO SOLN: 5.0000 mL | Freq: Three times a day (TID) | ORAL | 0 refills | Status: DC | PRN

## 2021-06-16 MED ORDER — AZITHROMYCIN 250 MG PO TABS: ORAL_TABLET | ORAL | 0 refills | Status: AC

## 2021-06-16 MED ORDER — PREDNISONE 10 MG (21) PO TBPK: ORAL_TABLET | ORAL | 0 refills | Status: DC

## 2021-06-16 NOTE — Progress Notes (Signed)
Subjective:    Patient ID: Traci Frank, female    DOB: 12/03/1980, 41 y.o.   MRN: [id number]  HPI  Pt had cough, nasal congestion and sinus pressure since last Tuesday. Early on she had fever, chills and sweats. Overall 9 days since symptom onset.  Pt had 2 covid test and was negative.  Pt went to UC on [address]. DC summary.  "Your flu test was negative.  We will contact you if your COVID test is positive.  Use albuterol every 4-6 hours as needed for shortness of breath.  Start prednisone burst (40 mg x 4 days) and do not take NSAIDs with this medication as can cause stomach bleeding including aspirin, ibuprofen/Advil, naproxen/Aleve.  You can use Tylenol, Mucinex, Flonase for symptom relief.  Use Promethazine DM for cough.  This can make you sleepy so do not drive or drink alcohol while taking it.  If you have any worsening symptoms including shortness of breath, high fever, nausea/vomiting interfering with oral intake, chest pain, worsening cough be seen immediately.  If symptoms or not improving by next week please return here see her PCP."   Pt tells me she is still wheezing despite th above treatment. Pt states prednisone seemed not help. Pt is using albuterol every 4 hours. She feels like this helps a little. Overall she feels like worse than when went to UC.  Review of Systems  Constitutional:  Positive for fatigue. Negative for chills and fever.  HENT:  Positive for congestion, sinus pressure, sinus pain and sore throat. Negative for ear pain.   Respiratory:  Positive for cough and wheezing. Negative for choking and stridor.   Cardiovascular:  Negative for chest pain and palpitations.  Gastrointestinal:  Negative for abdominal pain.  Genitourinary:  Negative for difficulty urinating, dysuria and enuresis.  Musculoskeletal:  Negative for back pain.  Skin:  Negative for rash.  Neurological:  Negative for dizziness, light-headedness and headaches.  Hematological:  Positive for  adenopathy. Does not bruise/bleed easily.       Mild tonsil hypertrophy and tender.  Psychiatric/Behavioral:  Negative for behavioral problems and confusion.     Past Medical History:  Diagnosis Date   Abnormal involuntary movements(781.0)    Right hand   Allergy    Anemia    Anxiety    Depression    Disturbance of skin sensation    Right arm   Headache(784.0)    Hypertension    Tingling sensation 05/27/2013   Tremor of unknown origin 05/27/2013   Uterine fibroid    Vertigo      Social History   Socioeconomic History   Marital status: Single    Spouse name: Not on file   Number of children: Not on file   Years of education: Not on file   Highest education level: Not on file  Occupational History   Occupation: call center rep    Employer: [employer]  Tobacco Use   Smoking status: Never   Smokeless tobacco: Never  Vaping Use   Vaping Use: Never used  Substance and Sexual Activity   Alcohol use: Yes    Alcohol/week: 0.0 standard drinks    Comment: rarely   Drug use: No   Sexual activity: Yes    Partners: Male    Birth control/protection: Condom  Other Topics Concern   Not on file  Social History Narrative   Not on file   Social Determinants of Health   Financial Resource Strain: Not on file  Food Insecurity: Not on file  Transportation Needs: Not on file  Physical Activity: Not on file  Stress: Not on file  Social Connections: Not on file  Intimate Partner Violence: Not on file    Past Surgical History:  Procedure Laterality Date   HERNIA REPAIR     HYSTERECTOMY ABDOMINAL WITH SALPINGECTOMY Bilateral 05/07/2020   Procedure: EXPLORATORY LAPAROTOMY,TOTAL HYSTERECTOMY ABDOMINAL WITH BILATERAL  SALPINGECTOMY GREATER THAN 250GM;  Surgeon: Everitt Amber, MD;  Location: [facility];  Service: Gynecology;  Laterality: Bilateral;   MYOMECTOMY     OMENTECTOMY N/A 05/07/2020   Procedure: OMENTECTOMY, RADICAL TUMOR DEBULKING;  Surgeon: Everitt Amber, MD;  Location: [facility];   Service: Gynecology;  Laterality: N/A;    Family History  Problem Relation Age of Onset   Hypertension Mother    Drug abuse Father    Diabetes Maternal Grandmother    Diabetes Maternal Grandfather    Stroke Paternal Grandfather    Bipolar disorder Cousin     Allergies  Allergen Reactions   Latex Itching    Current Outpatient Medications on File Prior to Visit  Medication Sig Dispense Refill   cetirizine (ZYRTEC) 10 MG tablet Take 10 mg by mouth daily in the afternoon.      hydrALAZINE (APRESOLINE) 25 MG tablet Take 1 tablet (25 mg total) by mouth 3 (three) times daily. 90 tablet 0   linaCLOtide (LINZESS PO) Take 1 tablet by mouth daily as needed.     Multiple Vitamin (THERA) TABS Take 1 tablet by mouth daily.     FLUoxetine (PROZAC) 10 MG capsule Take 2 capsules (20 mg total) by mouth daily. 60 capsule 2   No current facility-administered medications on file prior to visit.    BP (!) 150/90    Pulse 100    Temp 99.4 F (37.4 C)    Resp 18    Ht 5\' 2"  (1.575 m)    Wt 211 lb (95.7 kg)    LMP 04/13/2020    SpO2 96%    BMI 38.59 kg/m       Objective:   Physical Exam  General Mental Status- Alert. General Appearance- Not in acute distress.   Skin General: Color- Normal Color. Moisture- Normal Moisture.  Neck Carotid Arteries- Normal color. Moisture- Normal Moisture. No carotid bruits. No JVD.  Chest and Lung Exam Symmetric breathing.  Rough breath sounds bilaterally.  Respirations not labored.  Cardiovascular Auscultation:Rythm- Regular. Murmurs & Other Heart Sounds:Auscultation of the heart reveals- No Murmurs.  Abdomen Inspection:-Inspeection Normal. Palpation/Percussion:Note:No mass. Palpation and Percussion of the abdomen reveal- Non Tender, Non Distended + BS, no rebound or guarding.   Neurologic Cranial Nerve exam:- CN III-XII intact(No nystagmus), symmetric smile. Drift Test:- No drift. Romberg Exam:- Negative.  Heal to Toe Gait exam:-Normal. Finger to  Nose:- Normal/Intact Strength:- 5/5 equal and symmetric strength both upper and lower extremities.       Assessment & Plan:   Patient Instructions  Bronchitis type symptoms presently will some sinus pressure and sore throat.  Symptoms are persisting now for 9 days.  Tested negative for flu and COVID.  Since symptoms persisting despite treatment with steroids and cough medication I do think best to go ahead and get a chest x-ray.  Prescribing azithromycin antibiotic, Hycodan cough syrup, Flonase nasal spray and 6-day taper dose of prednisone.  With x-ray will see if he might have walking pneumonia. Note azithromycin antibiotic as good coverage for sinus infections and for potential strep throat.   Work excuse note  written today.  Follow-up in 7 to 10 days or sooner if needed.

## 2021-06-16 NOTE — Telephone Encounter (Signed)
Pt.notified

## 2021-06-16 NOTE — Patient Instructions (Signed)
Bronchitis type symptoms presently will some sinus pressure and sore throat.  Symptoms are persisting now for 9 days.  Tested negative for flu and COVID.  Since symptoms persisting despite treatment with steroids and cough medication I do think best to go ahead and get a chest x-ray.  Prescribing azithromycin antibiotic, Hycodan cough syrup, Flonase nasal spray and 6-day taper dose of prednisone.  With x-ray will see if he might have walking pneumonia. Note azithromycin antibiotic as good coverage for sinus infections and for potential strep throat.   Work excuse note written today.  Follow-up in 7 to 10 days or sooner if needed.

## 2021-06-16 NOTE — Telephone Encounter (Signed)
Pt notified of cxr results, wants to know what could be the cause of her illness since everything has come back negative

## 2021-07-12 NOTE — Telephone Encounter (Signed)
Patient states her cough still hasn't gotten better, she would like to know if she needs other medications or what other things she should do. Please advise.

## 2021-07-12 NOTE — Telephone Encounter (Signed)
Called pt stated she doesn't get off until 5 each day , scheduled with Melissa tomorrow at 5:20

## 2021-07-13 ENCOUNTER — Ambulatory Visit (INDEPENDENT_AMBULATORY_CARE_PROVIDER_SITE_OTHER): Payer: Managed Care, Other (non HMO) | Admitting: Family

## 2021-07-13 ENCOUNTER — Encounter: Payer: Self-pay | Admitting: Family

## 2021-07-13 VITALS — BP 152/103 | HR 98 | Temp 98.8°F | Resp 12 | Ht 62.0 in | Wt 216.4 lb

## 2021-07-13 DIAGNOSIS — J45901 Unspecified asthma with (acute) exacerbation: Secondary | ICD-10-CM

## 2021-07-13 DIAGNOSIS — K219 Gastro-esophageal reflux disease without esophagitis: Secondary | ICD-10-CM

## 2021-07-13 MED ORDER — FLUTICASONE-SALMETEROL 100-50 MCG/ACT IN AEPB: 1.0000 | INHALATION_SPRAY | Freq: Two times a day (BID) | RESPIRATORY_TRACT | 3 refills | Status: DC

## 2021-07-13 MED ORDER — PANTOPRAZOLE SODIUM 40 MG PO TBEC: 40.0000 mg | DELAYED_RELEASE_TABLET | Freq: Every day | ORAL | 3 refills | Status: DC

## 2021-07-13 NOTE — Progress Notes (Signed)
Subjective:     Patient ID: Traci Frank, female    DOB: Sep 14, 1980, 41 y.o.   MRN: [id number]  Chief Complaint  Patient presents with   Cough    Possible referral to pulmonary     HPI  Reports that about 1 month ago she developed viral symptoms.  Went to urgent care at that time. Was treated with a steroid shot, inhaler, cough medicine. Flu/covid testing were negative.  2 days later she started feeling worse.  Saw Edward on 06/16/21. Was diagnosed with bronchitis. She was prescribed azithromycin, hycodan cough syrup.  Thought she was getting better.  Reports cough, nasal congestion, voice hoarseness returned this past Friday. One day had low grade temp 99.    She currently taking zyrtec and flonase.  She does report + gerd symptoms, some days it is really bad and wakes her up from her sleep and notes that reflux contents "come out of my nose."  Health Maintenance Due  Topic Date Due   HIV Screening  Never done   Hepatitis C Screening  Never done   TETANUS/TDAP  11/01/2009   PAP SMEAR-Modifier  01/27/2017   INFLUENZA VACCINE  Never done   COVID-19 Vaccine (5 - Booster for Pfizer series) 05/24/2021    Past Medical History:  Diagnosis Date   Abnormal involuntary movements(781.0)    Right hand   Allergy    Anemia    Anxiety    Depression    Disturbance of skin sensation    Right arm   Headache(784.0)    Hypertension    Tingling sensation 05/27/2013   Tremor of unknown origin 05/27/2013   Uterine fibroid    Vertigo     Past Surgical History:  Procedure Laterality Date   HERNIA REPAIR     HYSTERECTOMY ABDOMINAL WITH SALPINGECTOMY Bilateral 05/07/2020   Procedure: EXPLORATORY LAPAROTOMY,TOTAL HYSTERECTOMY ABDOMINAL WITH BILATERAL  SALPINGECTOMY GREATER THAN 250GM;  Surgeon: Everitt Amber, MD;  Location: [facility];  Service: Gynecology;  Laterality: Bilateral;   MYOMECTOMY     OMENTECTOMY N/A 05/07/2020   Procedure: OMENTECTOMY, RADICAL TUMOR DEBULKING;  Surgeon: Everitt Amber, MD;  Location: [facility];  Service: Gynecology;  Laterality: N/A;    Family History  Problem Relation Age of Onset   Hypertension Mother    Drug abuse Father    Diabetes Maternal Grandmother    Diabetes Maternal Grandfather    Stroke Paternal Grandfather    Bipolar disorder Cousin     Social History   Socioeconomic History   Marital status: Single    Spouse name: Not on file   Number of children: Not on file   Years of education: Not on file   Highest education level: Not on file  Occupational History   Occupation: call center rep    Employer: [employer]  Tobacco Use   Smoking status: Never   Smokeless tobacco: Never  Vaping Use   Vaping Use: Never used  Substance and Sexual Activity   Alcohol use: Yes    Alcohol/week: 0.0 standard drinks    Comment: rarely   Drug use: No   Sexual activity: Yes    Partners: Male    Birth control/protection: Condom  Other Topics Concern   Not on file  Social History Narrative   Not on file   Social Determinants of Health   Financial Resource Strain: Not on file  Food Insecurity: Not on file  Transportation Needs: Not on file  Physical Activity: Not on file  Stress: Not on file  Social Connections: Not on file  Intimate Partner Violence: Not on file    Outpatient Medications Prior to Visit  Medication Sig Dispense Refill   albuterol (VENTOLIN HFA) 108 (90 Base) MCG/ACT inhaler Inhale 2 puffs into the lungs every 4 (four) hours as needed for wheezing or shortness of breath.     cetirizine (ZYRTEC) 10 MG tablet Take 10 mg by mouth daily in the afternoon.      fluticasone (FLONASE) 50 MCG/ACT nasal spray Place 2 sprays into both nostrils daily. 16 g 1   hydrALAZINE (APRESOLINE) 25 MG tablet Take 1 tablet (25 mg total) by mouth 3 (three) times daily. 90 tablet 0   HYDROcodone bit-homatropine (HYCODAN) 5-1.5 MG/5ML syrup Take 5 mLs by mouth every 8 (eight) hours as needed for cough. 120 mL 0   linaCLOtide (LINZESS PO) Take 1 tablet  by mouth daily as needed.     Multiple Vitamin (THERA) TABS Take 1 tablet by mouth daily.     FLUoxetine (PROZAC) 10 MG capsule Take 2 capsules (20 mg total) by mouth daily. 60 capsule 2   predniSONE (STERAPRED UNI-PAK 21 TAB) 10 MG (21) TBPK tablet Standard Taper over 6 days. 21 tablet 0   No facility-administered medications prior to visit.    Allergies  Allergen Reactions   Latex Itching    ROS See HPI    Objective:    Physical Exam Constitutional:      General: She is not in acute distress.    Appearance: Normal appearance. She is well-developed.  HENT:     Head: Normocephalic and atraumatic.     Right Ear: External ear normal.     Left Ear: External ear normal.  Eyes:     General: No scleral icterus. Neck:     Thyroid: No thyromegaly.  Cardiovascular:     Rate and Rhythm: Normal rate and regular rhythm.     Heart sounds: Normal heart sounds. No murmur heard. Pulmonary:     Effort: Pulmonary effort is normal. No respiratory distress.     Breath sounds: Wheezing present.  Musculoskeletal:     Cervical back: Neck supple.  Skin:    General: Skin is warm and dry.  Neurological:     Mental Status: She is alert and oriented to person, place, and time.  Psychiatric:        Mood and Affect: Mood normal.        Behavior: Behavior normal.        Thought Content: Thought content normal.        Judgment: Judgment normal.    BP (!) 152/103 (BP Location: Right Arm, Cuff Size: Large)    Pulse 98    Temp 98.8 F (37.1 C) (Oral)    Resp 12    Ht 5\' 2"  (1.575 m)    Wt 216 lb 6.4 oz (98.2 kg)    LMP 04/13/2020    SpO2 97%    BMI 39.58 kg/m  Wt Readings from Last 3 Encounters:  07/13/21 216 lb 6.4 oz (98.2 kg)  06/16/21 211 lb (95.7 kg)  03/23/21 220 lb (99.8 kg)       Assessment & Plan:   Problem List Items Addressed This Visit       Unprioritized   GERD (gastroesophageal reflux disease)    Uncontrolled. Discussed gerd precautions/gerd diet. Will give trial of  protonix 40mg .       Relevant Medications   pantoprazole (PROTONIX) 40 MG tablet  Asthma with acute exacerbation - Primary    Uncontrolled. I think that this is likely been exacerbated by recent illness as well as gerd.  Will give trial of advair, continue prn albuterol and plan to treat gerd.       Relevant Medications   albuterol (VENTOLIN HFA) 108 (90 Base) MCG/ACT inhaler   fluticasone-salmeterol (ADVAIR) 100-50 MCG/ACT AEPB    I have discontinued Traci Frank's FLUoxetine and predniSONE. I am also having her maintain her cetirizine, Thera, linaCLOtide (LINZESS PO), hydrALAZINE, HYDROcodone bit-homatropine, fluticasone, albuterol, fluticasone-salmeterol, and pantoprazole.  Meds ordered this encounter  Medications   DISCONTD: pantoprazole (PROTONIX) 40 MG tablet    Sig: Take 1 tablet (40 mg total) by mouth daily.    Dispense:  30 tablet    Refill:  3    Order Specific Question:   Supervising Provider    Answer:   Penni Homans A [[id number]]   DISCONTD: fluticasone-salmeterol (ADVAIR) 100-50 MCG/ACT AEPB    Sig: Inhale 1 puff into the lungs 2 (two) times daily.    Dispense:  1 each    Refill:  3    Order Specific Question:   Supervising Provider    Answer:   Penni Homans A [[id number]]   fluticasone-salmeterol (ADVAIR) 100-50 MCG/ACT AEPB    Sig: Inhale 1 puff into the lungs 2 (two) times daily.    Dispense:  1 each    Refill:  3    Order Specific Question:   Supervising Provider    Answer:   Penni Homans A [[id number]]   pantoprazole (PROTONIX) 40 MG tablet    Sig: Take 1 tablet (40 mg total) by mouth daily.    Dispense:  30 tablet    Refill:  3    Order Specific Question:   Supervising Provider    Answer:   Penni Homans A [[id number]]

## 2021-07-13 NOTE — Patient Instructions (Addendum)
Start protonix for reflux.  Start advair twice daily.

## 2021-07-14 DIAGNOSIS — J45901 Unspecified asthma with (acute) exacerbation: Secondary | ICD-10-CM | POA: Insufficient documentation

## 2021-07-14 DIAGNOSIS — K219 Gastro-esophageal reflux disease without esophagitis: Secondary | ICD-10-CM | POA: Insufficient documentation

## 2021-07-14 NOTE — Assessment & Plan Note (Signed)
Uncontrolled. Discussed gerd precautions/gerd diet. Will give trial of protonix 40mg .

## 2021-07-14 NOTE — Assessment & Plan Note (Addendum)
Uncontrolled. I think that this is likely been exacerbated by recent illness as well as gerd.  Will give trial of advair, continue prn albuterol and plan to treat gerd.

## 2021-08-13 ENCOUNTER — Telehealth: Payer: Managed Care, Other (non HMO) | Admitting: Medical

## 2021-09-08 ENCOUNTER — Telehealth: Payer: Self-pay | Admitting: Medical

## 2021-09-08 NOTE — Telephone Encounter (Signed)
Pt states she would like a refill on an rx she was given a while ago. Previous medications have given her headaches, and this seems to be the one that helps.  ? ?Medication: pt stated it was losartan mixed with hydrochlorothiazide  ? ?Has the patient contacted their pharmacy? No. ? ? ?Preferred Pharmacy:  ?[facility] ?[address] [place], [place] [address] ?([phone] ?

## 2021-09-09 NOTE — Telephone Encounter (Signed)
Combination medication last prescribed in 2021 , worked for her didn't cause an headache  ? ?Pt stated she will call back with her BP on her lunch , also stated it might be high because she ran out of her BP medication ? ?Pt scheduled for 09/17/2021 ?

## 2021-09-17 ENCOUNTER — Ambulatory Visit (INDEPENDENT_AMBULATORY_CARE_PROVIDER_SITE_OTHER): Payer: Managed Care, Other (non HMO) | Admitting: Medical

## 2021-09-17 ENCOUNTER — Other Ambulatory Visit (HOSPITAL_COMMUNITY)
Admission: RE | Admit: 2021-09-17 | Discharge: 2021-09-17 | Disposition: A | Discharge status: Home / Self Care | Payer: Managed Care, Other (non HMO) | Source: Ambulatory Visit | Attending: Medical | Admitting: Medical

## 2021-09-17 VITALS — BP 172/100 | HR 70 | Resp 18 | Ht 62.0 in | Wt 217.8 lb

## 2021-09-17 DIAGNOSIS — I10 Essential (primary) hypertension: Secondary | ICD-10-CM | POA: Diagnosis not present

## 2021-09-17 DIAGNOSIS — Z90710 Acquired absence of both cervix and uterus: Secondary | ICD-10-CM | POA: Diagnosis not present

## 2021-09-17 DIAGNOSIS — Z113 Encounter for screening for infections with a predominantly sexual mode of transmission: Secondary | ICD-10-CM | POA: Diagnosis not present

## 2021-09-17 DIAGNOSIS — N898 Other specified noninflammatory disorders of vagina: Secondary | ICD-10-CM

## 2021-09-17 MED ORDER — HYDROCHLOROTHIAZIDE 12.5 MG PO CAPS: 12.5000 mg | ORAL_CAPSULE | Freq: Every day | ORAL | 0 refills | Status: DC

## 2021-09-17 MED ORDER — METRONIDAZOLE 500 MG PO TABS: 500.0000 mg | ORAL_TABLET | Freq: Three times a day (TID) | ORAL | 0 refills | Status: AC

## 2021-09-17 MED ORDER — VALSARTAN 160 MG PO TABS: 160.0000 mg | ORAL_TABLET | Freq: Every day | ORAL | 0 refills | Status: DC

## 2021-09-17 NOTE — Patient Instructions (Addendum)
Hypertension-not well controlled but not medication presently.  Think best at this point to try valsartan 160 mg daily dose in combination with HCTZ 12.5 mg daily dose.  Would asked that you check blood pressure daily.  Follow-up in 10 days for an office visit blood pressure check or sooner if needed.  CMP lab ordered today. ? ?Vaginal discharge with fishy odor since having hysterectomy.  Order urine ancillary studies to be done.  During the interim went ahead and prescribed Flagyl antibiotic.  Included in the labs today HIV and RPR screening STD labs. ? ?Discussed recent hysterectomy.  Patient had questions about hormone supplementation but on further discussion patient still has both ovaries so explained presently would not refer to gynecologist.  However if she has persisting discharge and negative studies will refer. ? ?Follow-up in 10 days or sooner if needed. ?

## 2021-09-17 NOTE — Progress Notes (Signed)
? ?Subjective:  ? ? Patient ID: Traci Frank, female    DOB: Oct 04, 1980, 41 y.o.   MRN: [id number] ? ?HPI ? ?Pt in for follow up. ? ?Pt state she was on hydralazine 25 mg tid. She states when she was on she thought caused ha as she noticed when she stopped her HA were much less. Then states will get ha if bp spikes to level such as 153/118. ? ?Pt stopped hydralazine months ago.  ? ?Pt is did not respond adquatley to losartan or amlodipine. In the past per her report.  ? ?2 weeks ago she found one week supply of losartan and hctz ?Pt recently used losartan 100 mg daily recently with diuretic but ran out one week ago. She did not check her bp.  ? ?Currently no cardiac or neurologic signs/symptoms. ? ?Off med losartan and hctz for one week. ? ? ?Pt also mention that ever since her hysterectomy has had slight vaginal dc with fishy odor. ?Pt still has ovaries.. ? ? ? ?Review of Systems  ?Constitutional:  Negative for chills, fatigue and fever.  ?Respiratory:  Negative for cough, chest tightness, shortness of breath and wheezing.   ?Cardiovascular:  Negative for chest pain and palpitations.  ?Gastrointestinal:  Negative for abdominal pain.  ?Genitourinary:  Positive for vaginal discharge. Negative for dysuria, flank pain, frequency, pelvic pain, urgency and vaginal pain.  ?     Fishy odor  ?Neurological:  Negative for facial asymmetry, speech difficulty, weakness and light-headedness.  ?Hematological:  Negative for adenopathy.  ?Psychiatric/Behavioral:  Negative for behavioral problems and confusion.   ? ? ?Past Medical History:  ?Diagnosis Date  ? Abnormal involuntary movements(781.0)   ? Right hand  ? Allergy   ? Anemia   ? Anxiety   ? Depression   ? Disturbance of skin sensation   ? Right arm  ? Headache(784.0)   ? Hypertension   ? Tingling sensation 05/27/2013  ? Tremor of unknown origin 05/27/2013  ? Uterine fibroid   ? Vertigo   ? ?  ?Social History  ? ?Socioeconomic History  ? Marital status: Single  ?  Spouse  name: Not on file  ? Number of children: Not on file  ? Years of education: Not on file  ? Highest education level: Not on file  ?Occupational History  ? Occupation: call center rep  ?  Employer: [employer]  ?Tobacco Use  ? Smoking status: Never  ? Smokeless tobacco: Never  ?Vaping Use  ? Vaping Use: Never used  ?Substance and Sexual Activity  ? Alcohol use: Yes  ?  Alcohol/week: 0.0 standard drinks  ?  Comment: rarely  ? Drug use: No  ? Sexual activity: Yes  ?  Partners: Male  ?  Birth control/protection: Condom  ?Other Topics Concern  ? Not on file  ?Social History Narrative  ? Not on file  ? ?Social Determinants of Health  ? ?Financial Resource Strain: Not on file  ?Food Insecurity: Not on file  ?Transportation Needs: Not on file  ?Physical Activity: Not on file  ?Stress: Not on file  ?Social Connections: Not on file  ?Intimate Partner Violence: Not on file  ? ? ?Past Surgical History:  ?Procedure Laterality Date  ? HERNIA REPAIR    ? HYSTERECTOMY ABDOMINAL WITH SALPINGECTOMY Bilateral 05/07/2020  ? Procedure: EXPLORATORY LAPAROTOMY,TOTAL HYSTERECTOMY ABDOMINAL WITH BILATERAL  SALPINGECTOMY GREATER THAN 250GM;  Surgeon: Everitt Amber, MD;  Location: [facility];  Service: Gynecology;  Laterality: Bilateral;  ? MYOMECTOMY    ?  OMENTECTOMY N/A 05/07/2020  ? Procedure: OMENTECTOMY, RADICAL TUMOR DEBULKING;  Surgeon: Everitt Amber, MD;  Location: [facility];  Service: Gynecology;  Laterality: N/A;  ? ? ?Family History  ?Problem Relation Age of Onset  ? Hypertension Mother   ? Drug abuse Father   ? Diabetes Maternal Grandmother   ? Diabetes Maternal Grandfather   ? Stroke Paternal Grandfather   ? Bipolar disorder Cousin   ? ? ?Allergies  ?Allergen Reactions  ? Latex Itching  ? ? ?Current Outpatient Medications on File Prior to Visit  ?Medication Sig Dispense Refill  ? albuterol (VENTOLIN HFA) 108 (90 Base) MCG/ACT inhaler Inhale 2 puffs into the lungs every 4 (four) hours as needed for wheezing or shortness of breath.    ? cetirizine  (ZYRTEC) 10 MG tablet Take 10 mg by mouth daily in the afternoon.     ? fluticasone (FLONASE) 50 MCG/ACT nasal spray Place 2 sprays into both nostrils daily. 16 g 1  ? fluticasone-salmeterol (ADVAIR) 100-50 MCG/ACT AEPB Inhale 1 puff into the lungs 2 (two) times daily. 1 each 3  ? HYDROcodone bit-homatropine (HYCODAN) 5-1.5 MG/5ML syrup Take 5 mLs by mouth every 8 (eight) hours as needed for cough. 120 mL 0  ? linaCLOtide (LINZESS PO) Take 1 tablet by mouth daily as needed.    ? Multiple Vitamin (THERA) TABS Take 1 tablet by mouth daily.    ? pantoprazole (PROTONIX) 40 MG tablet Take 1 tablet (40 mg total) by mouth daily. 30 tablet 3  ? ?No current facility-administered medications on file prior to visit.  ? ? ?BP (!) 172/100   Pulse 70   Resp 18   Ht '5\' 2"'$  (1.575 m)   Wt 217 lb 12.8 oz (98.8 kg)   LMP 04/13/2020   SpO2 100%   BMI 39.84 kg/m?  ? 155/88 ?   ?Objective:  ? Physical Exam ? ?General- No acute distress. Pleasant patient. ?Neck- Full range of motion, no jvd ?Lungs- Clear, even and unlabored. ?Heart- regular rate and rhythm. ?Neurologic- CNII- XII grossly intact.  ? ?   ?Assessment & Plan:  ? ?Patient Instructions  ?Hypertension-not well controlled but not medication presently.  Think best at this point to try valsartan 160 mg daily dose in combination with HCTZ 12.5 mg daily dose.  Would asked that you check blood pressure daily.  Follow-up in 10 days for an office visit blood pressure check or sooner if needed.  CMP lab ordered today. ? ?Vaginal discharge with fishy odor since having hysterectomy.  Order urine ancillary studies to be done.  During the interim went ahead and prescribed Flagyl antibiotic.  Included in the labs today HIV and RPR screening STD labs. ? ?Discussed recent hysterectomy.  Patient had questions about hormone supplementation but on further discussion patient still has both ovaries so explained presently would not refer to gynecologist.  However if she has persisting  discharge and negative studies will refer. ? ?Follow-up in 10 days or sooner if needed.  ? ?Mackie Pai, PA-C  ? ?

## 2021-09-18 LAB — COMPREHENSIVE METABOLIC PANEL
AG Ratio: 1.3 (calc) (ref 1.0–2.5)
ALT: 13 U/L (ref 6–29)
AST: 15 U/L (ref 10–30)
Albumin: 4 g/dL (ref 3.6–5.1)
Alkaline phosphatase (APISO): 70 U/L (ref 31–125)
BUN: 11 mg/dL (ref 7–25)
CO2: 21 mmol/L (ref 20–32)
Calcium: 9.2 mg/dL (ref 8.6–10.2)
Chloride: 103 mmol/L (ref 98–110)
Creat: 0.9 mg/dL (ref 0.50–0.97)
Globulin: 3.2 g/dL (calc) (ref 1.9–3.7)
Glucose, Bld: 94 mg/dL (ref 65–99)
Potassium: 3.9 mmol/L (ref 3.5–5.3)
Sodium: 139 mmol/L (ref 135–146)
Total Bilirubin: 0.5 mg/dL (ref 0.2–1.2)
Total Protein: 7.2 g/dL (ref 6.1–8.1)

## 2021-09-20 LAB — HIV ANTIBODY (ROUTINE TESTING W REFLEX): HIV 1&2 Ab, 4th Generation: NONREACTIVE

## 2021-09-20 LAB — RPR: RPR Ser Ql: NONREACTIVE

## 2021-09-21 LAB — URINE CYTOLOGY ANCILLARY ONLY
Bacterial Vaginitis-Urine: NEGATIVE
Candida Urine: NEGATIVE
Chlamydia: NEGATIVE
Comment: NEGATIVE
Comment: NORMAL
Neisseria Gonorrhea: NEGATIVE

## 2021-10-01 ENCOUNTER — Encounter: Payer: Self-pay | Admitting: Medical

## 2021-10-01 ENCOUNTER — Ambulatory Visit (INDEPENDENT_AMBULATORY_CARE_PROVIDER_SITE_OTHER): Payer: Managed Care, Other (non HMO) | Admitting: Medical

## 2021-10-01 VITALS — BP 150/92 | HR 89 | Resp 18 | Ht 62.0 in | Wt 217.2 lb

## 2021-10-01 DIAGNOSIS — E669 Obesity, unspecified: Secondary | ICD-10-CM | POA: Diagnosis not present

## 2021-10-01 DIAGNOSIS — I10 Essential (primary) hypertension: Secondary | ICD-10-CM | POA: Diagnosis not present

## 2021-10-01 MED ORDER — METFORMIN HCL 500 MG PO TABS: 500.0000 mg | ORAL_TABLET | Freq: Every day | ORAL | 3 refills | Status: DC

## 2021-10-01 NOTE — Progress Notes (Signed)
? ?Subjective:  ? ? Patient ID: Traci Frank, female    DOB: November 28, 1980, 41 y.o.   MRN: [id number] ? ?HPI ? ?Pt in for bp check. Pt states her at home today 161/98. Today bp was 158/90.  ?Last bp check was 172/100. ? ?Pt in past had pedal edema with amlodipine. Hydralozine may have caused HA.  ? ?No cardiac or neurologic signs or symptoms with her bp levels. ? ? ?Pt does desire weight loss. She is not diabetic. Pt has been considering med options for wt loss.  ? ? ? ?Review of Systems  ?Constitutional:  Negative for chills and fatigue.  ?Respiratory:  Negative for chest tightness, shortness of breath and wheezing.   ?Cardiovascular:  Negative for chest pain and palpitations.  ?Gastrointestinal:  Negative for anal bleeding and constipation.  ?Genitourinary:  Negative for difficulty urinating and dysuria.  ?Musculoskeletal:  Negative for back pain and joint swelling.  ? ? ?Past Medical History:  ?Diagnosis Date  ? Abnormal involuntary movements(781.0)   ? Right hand  ? Allergy   ? Anemia   ? Anxiety   ? Depression   ? Disturbance of skin sensation   ? Right arm  ? Headache(784.0)   ? Hypertension   ? Tingling sensation 05/27/2013  ? Tremor of unknown origin 05/27/2013  ? Uterine fibroid   ? Vertigo   ? ?  ?Social History  ? ?Socioeconomic History  ? Marital status: Single  ?  Spouse name: Not on file  ? Number of children: Not on file  ? Years of education: Not on file  ? Highest education level: Not on file  ?Occupational History  ? Occupation: call center rep  ?  Employer: [employer]  ?Tobacco Use  ? Smoking status: Never  ? Smokeless tobacco: Never  ?Vaping Use  ? Vaping Use: Never used  ?Substance and Sexual Activity  ? Alcohol use: Yes  ?  Alcohol/week: 0.0 standard drinks  ?  Comment: rarely  ? Drug use: No  ? Sexual activity: Yes  ?  Partners: Male  ?  Birth control/protection: Condom  ?Other Topics Concern  ? Not on file  ?Social History Narrative  ? Not on file  ? ?Social Determinants of Health  ? ?Financial  Resource Strain: Not on file  ?Food Insecurity: Not on file  ?Transportation Needs: Not on file  ?Physical Activity: Not on file  ?Stress: Not on file  ?Social Connections: Not on file  ?Intimate Partner Violence: Not on file  ? ? ?Past Surgical History:  ?Procedure Laterality Date  ? HERNIA REPAIR    ? HYSTERECTOMY ABDOMINAL WITH SALPINGECTOMY Bilateral 05/07/2020  ? Procedure: EXPLORATORY LAPAROTOMY,TOTAL HYSTERECTOMY ABDOMINAL WITH BILATERAL  SALPINGECTOMY GREATER THAN 250GM;  Surgeon: Everitt Amber, MD;  Location: [facility];  Service: Gynecology;  Laterality: Bilateral;  ? MYOMECTOMY    ? OMENTECTOMY N/A 05/07/2020  ? Procedure: OMENTECTOMY, RADICAL TUMOR DEBULKING;  Surgeon: Everitt Amber, MD;  Location: [facility];  Service: Gynecology;  Laterality: N/A;  ? ? ?Family History  ?Problem Relation Age of Onset  ? Hypertension Mother   ? Drug abuse Father   ? Diabetes Maternal Grandmother   ? Diabetes Maternal Grandfather   ? Stroke Paternal Grandfather   ? Bipolar disorder Cousin   ? ? ?Allergies  ?Allergen Reactions  ? Latex Itching  ? ? ?Current Outpatient Medications on File Prior to Visit  ?Medication Sig Dispense Refill  ? albuterol (VENTOLIN HFA) 108 (90 Base) MCG/ACT inhaler Inhale 2  puffs into the lungs every 4 (four) hours as needed for wheezing or shortness of breath.    ? cetirizine (ZYRTEC) 10 MG tablet Take 10 mg by mouth daily in the afternoon.     ? fluticasone (FLONASE) 50 MCG/ACT nasal spray Place 2 sprays into both nostrils daily. 16 g 1  ? fluticasone-salmeterol (ADVAIR) 100-50 MCG/ACT AEPB Inhale 1 puff into the lungs 2 (two) times daily. 1 each 3  ? hydrochlorothiazide (MICROZIDE) 12.5 MG capsule Take 1 capsule (12.5 mg total) by mouth daily. 30 capsule 0  ? HYDROcodone bit-homatropine (HYCODAN) 5-1.5 MG/5ML syrup Take 5 mLs by mouth every 8 (eight) hours as needed for cough. 120 mL 0  ? linaCLOtide (LINZESS PO) Take 1 tablet by mouth daily as needed.    ? Multiple Vitamin (THERA) TABS Take 1 tablet by  mouth daily.    ? pantoprazole (PROTONIX) 40 MG tablet Take 1 tablet (40 mg total) by mouth daily. 30 tablet 3  ? [DISCONTINUED] valsartan (DIOVAN) 160 MG tablet Take 1 tablet (160 mg total) by mouth daily. 30 tablet 0  ? ?No current facility-administered medications on file prior to visit.  ? ? ?BP (!) 158/90   Pulse 89   Resp 18   Ht '5\' 2"'$  (1.575 m)   Wt 217 lb 3.2 oz (98.5 kg)   LMP 04/13/2020   SpO2 99%   BMI 39.73 kg/m?  ?  ?   ?Objective:  ? Physical Exam ? ?General ?Mental Status- Alert. General Appearance- Not in acute distress.  ? ?Skin ?General: Color- Normal Color. Moisture- Normal Moisture. ? ?Neck ?Carotid Arteries- Normal color. Moisture- Normal Moisture. No carotid bruits. No JVD. ? ?Chest and Lung Exam ?Auscultation: ?Breath Sounds:-Normal. ? ?Cardiovascular ?Auscultation:Rythm- Regular. ?Murmurs & Other Heart Sounds:Auscultation of the heart reveals- No Murmurs. ? ?Abdomen ?Inspection:-Inspeection Normal. ?Palpation/Percussion:Note:No mass. Palpation and Percussion of the abdomen reveal- Non Tender, Non Distended + BS, no rebound or guarding. ? ? ?Neurologic ?Cranial Nerve exam:- CN III-XII intact(No nystagmus), symmetric smile. ?Strength:- 5/5 equal and symmetric strength both upper and lower extremities.  ? ? ?   ?Assessment & Plan:  ? ?Patient Instructions  ?Htn- your bp high despites using valartan 160 mg daily ad hctz 12.5 mg daily. I want you to increase valsartan to 320 mg daily. Schedule nurse blood pressure check in 2 weeks. ? ? ?For obesity recommend ww diet and can try low dose metformin 500 mg daily. ? ?Follow up in 2 weeks nurse bp check or sooner if needed.  ?

## 2021-10-01 NOTE — Patient Instructions (Addendum)
Htn- your bp high despite using valartan 160 mg daily ad hctz 12.5 mg daily. I want you to increase valsartan to 320 mg daily. Schedule nurse blood pressure check in 2 weeks. ? ? ?For obesity recommend ww diet and can try low dose metformin 500 mg daily. ? ?Follow up in 2 weeks nurse bp check or sooner if needed. ?

## 2021-10-09 ENCOUNTER — Other Ambulatory Visit: Payer: Self-pay | Admitting: Medical

## 2021-10-11 ENCOUNTER — Other Ambulatory Visit: Payer: Self-pay | Admitting: Medical

## 2021-10-15 ENCOUNTER — Ambulatory Visit (INDEPENDENT_AMBULATORY_CARE_PROVIDER_SITE_OTHER): Payer: Managed Care, Other (non HMO)

## 2021-10-15 ENCOUNTER — Telehealth: Payer: Self-pay | Admitting: Medical

## 2021-10-15 ENCOUNTER — Other Ambulatory Visit: Payer: Self-pay

## 2021-10-15 DIAGNOSIS — I10 Essential (primary) hypertension: Secondary | ICD-10-CM | POA: Diagnosis not present

## 2021-10-15 MED ORDER — VALSARTAN 160 MG PO TABS: ORAL_TABLET | ORAL | 0 refills | Status: DC

## 2021-10-15 NOTE — Progress Notes (Addendum)
Pt here for Blood pressure check per Mackie Pai, PA-C " Htn- your bp high despite using valartan 160 mg daily ad hctz 12.5 mg daily. I want you to increase valsartan to 320 mg daily. Schedule nurse blood pressure check in 2 weeks."  ? ?Pt is currently supposed to be taking: Valsartan 320 mg, HCTZ 12.5 mg ? ?Pt says she has been out of her Valsartan since about Monday d/t her having to double the dose. She has continued her HCTZ 12.5 mg ?Readings at home have been around: 150's/110- even with the Valsartan.  ? ?BP today L arm: ?132/ 88 L ?HR = 95 ? ?R arm: ?134/ 90 ?HR= 99 ? ?Pt advised per Mackie Pai, PA-C: To schedule OV on next Tuesday and bring her monitor in for comparison. Pt is scheduled for 10/25/21 (first day that she could come in due to her work schedule) and will bring her monitor in for comparison. Refill sent in for Valsartan at increased dose.  ? ?Above is what I advised. But will also ask staff to call pt on Tuesday as this is day I wanted her to come in as her recent bp readings not correlating with advise given. Bp worse with losartan better when not taking?? Question accuracy of the machine. ? ?Will you call pt on Tuesday to see how she is and to ask her to check bp. She made appointment for 10 days out. That is bit too far out in light of her bp readings? Will you ask her to new bp cuff and start checking bp daily. Ask she give [place] update on daily readings Friday morning. ? ? ?Mackie Pai, PA-C  ? ?

## 2021-10-15 NOTE — Telephone Encounter (Signed)
Will you call pt on Tuesday to see how she is and to ask her to check bp. She made appointment for 10 days out. That is bit too far out in light of her bp readings? Will you ask her to new bp cuff and start checking bp daily. Ask she give [place] update on daily readings Friday morning. ?

## 2021-10-19 NOTE — Telephone Encounter (Signed)
Sent pt mychart message

## 2021-10-25 ENCOUNTER — Other Ambulatory Visit (HOSPITAL_BASED_OUTPATIENT_CLINIC_OR_DEPARTMENT_OTHER): Payer: Self-pay

## 2021-10-25 ENCOUNTER — Ambulatory Visit (INDEPENDENT_AMBULATORY_CARE_PROVIDER_SITE_OTHER): Payer: Managed Care, Other (non HMO) | Admitting: Medical

## 2021-10-25 ENCOUNTER — Encounter: Payer: Self-pay | Admitting: Medical

## 2021-10-25 VITALS — BP 140/90 | HR 96 | Temp 98.0°F | Resp 18 | Ht 62.0 in | Wt 216.0 lb

## 2021-10-25 DIAGNOSIS — M6530 Trigger finger, unspecified finger: Secondary | ICD-10-CM

## 2021-10-25 MED ORDER — VALSARTAN 160 MG PO TABS
160.0000 mg | ORAL_TABLET | Freq: Every day | ORAL | 0 refills | Status: DC
  Filled 2021-10-25: qty 30, 30d supply, fill #0

## 2021-10-25 MED ORDER — HYDROCHLOROTHIAZIDE 12.5 MG PO CAPS
12.5000 mg | ORAL_CAPSULE | Freq: Every day | ORAL | 0 refills | Status: DC
Start: 2021-10-25 — End: 2022-04-11
  Filled 2021-10-25: qty 30, 30d supply, fill #0

## 2021-10-25 NOTE — Patient Instructions (Addendum)
Your bp is borderline level 140/90 when I checked and higher when MA checked. I think you wrist cuff is reading high. Recommend getting new otc electronic cuff.   At home manual cuff and mother electronic cuff matching. Both moderate well controlled readings.  Recommend just using valsartan 160 mg daily and hctz 12.5 mg daily. Continue to check bp daily.  For left trigger finger let me know when you get new insurance then will place the referral to hand specialist.  Follow up Nov 03, 2021 for bp check or sooner if need

## 2021-10-25 NOTE — Progress Notes (Signed)
Subjective:    Patient ID: Traci Frank, female    DOB: 02-04-1981, 41 y.o.   MRN: [id number]  HPI  Pt in for follow up on bp.  A/P on last visit.    "Htn- your bp high despites using valartan 160 mg daily ad hctz 12.5 mg daily. I want you to increase valsartan to 320 mg daily. Schedule nurse blood pressure check in 2 weeks."   Pt states she did double up on valsartan her bp was still high. She ran out of hctz for one week. She ran out of valsartan 2 weeks.  Pt has not been exercising. No drastic changes in diet.  Pt states all last week her bp was 130-140/70-80. Highest reading pt got over past week was 146.   Left thumb trigger finger for 4 months.Occurs every day. Worse in morning with pain.   Review of Systems  Constitutional:  Negative for chills, fatigue and fever.  Respiratory:  Negative for cough, chest tightness, shortness of breath and wheezing.   Cardiovascular:  Negative for chest pain and palpitations.  Gastrointestinal:  Negative for abdominal pain.  Musculoskeletal:  Negative for back pain.  Neurological:  Negative for dizziness, weakness, light-headedness and headaches.  Hematological:  Negative for adenopathy. Does not bruise/bleed easily.    Past Medical History:  Diagnosis Date   Abnormal involuntary movements(781.0)    Right hand   Allergy    Anemia    Anxiety    Depression    Disturbance of skin sensation    Right arm   Headache(784.0)    Hypertension    Tingling sensation 05/27/2013   Tremor of unknown origin 05/27/2013   Uterine fibroid    Vertigo      Social History   Socioeconomic History   Marital status: Single    Spouse name: Not on file   Number of children: Not on file   Years of education: Not on file   Highest education level: Not on file  Occupational History   Occupation: call center rep    Employer: [employer]  Tobacco Use   Smoking status: Never   Smokeless tobacco: Never  Vaping Use   Vaping Use: Never used   Substance and Sexual Activity   Alcohol use: Yes    Alcohol/week: 0.0 standard drinks    Comment: rarely   Drug use: No   Sexual activity: Yes    Partners: Male    Birth control/protection: Condom  Other Topics Concern   Not on file  Social History Narrative   Not on file   Social Determinants of Health   Financial Resource Strain: Not on file  Food Insecurity: Not on file  Transportation Needs: Not on file  Physical Activity: Not on file  Stress: Not on file  Social Connections: Not on file  Intimate Partner Violence: Not on file    Past Surgical History:  Procedure Laterality Date   HERNIA REPAIR     HYSTERECTOMY ABDOMINAL WITH SALPINGECTOMY Bilateral 05/07/2020   Procedure: EXPLORATORY LAPAROTOMY,TOTAL HYSTERECTOMY ABDOMINAL WITH BILATERAL  SALPINGECTOMY GREATER THAN 250GM;  Surgeon: Everitt Amber, MD;  Location: [facility];  Service: Gynecology;  Laterality: Bilateral;   MYOMECTOMY     OMENTECTOMY N/A 05/07/2020   Procedure: OMENTECTOMY, RADICAL TUMOR DEBULKING;  Surgeon: Everitt Amber, MD;  Location: [facility];  Service: Gynecology;  Laterality: N/A;    Family History  Problem Relation Age of Onset   Hypertension Mother    Drug abuse Father  Diabetes Maternal Grandmother    Diabetes Maternal Grandfather    Stroke Paternal Grandfather    Bipolar disorder Cousin     Allergies  Allergen Reactions   Latex Itching    Current Outpatient Medications on File Prior to Visit  Medication Sig Dispense Refill   albuterol (VENTOLIN HFA) 108 (90 Base) MCG/ACT inhaler Inhale 2 puffs into the lungs every 4 (four) hours as needed for wheezing or shortness of breath.     cetirizine (ZYRTEC) 10 MG tablet Take 10 mg by mouth daily in the afternoon.      fluticasone (FLONASE) 50 MCG/ACT nasal spray Place 2 sprays into both nostrils daily. 16 g 1   fluticasone-salmeterol (ADVAIR) 100-50 MCG/ACT AEPB Inhale 1 puff into the lungs 2 (two) times daily. 1 each 3   hydrochlorothiazide  (MICROZIDE) 12.5 MG capsule TAKE 1 CAPSULE BY MOUTH EVERY DAY 30 capsule 0   HYDROcodone bit-homatropine (HYCODAN) 5-1.5 MG/5ML syrup Take 5 mLs by mouth every 8 (eight) hours as needed for cough. 120 mL 0   linaCLOtide (LINZESS PO) Take 1 tablet by mouth daily as needed.     metFORMIN (GLUCOPHAGE) 500 MG tablet Take 1 tablet (500 mg total) by mouth daily with breakfast. 30 tablet 3   Multiple Vitamin (THERA) TABS Take 1 tablet by mouth daily.     pantoprazole (PROTONIX) 40 MG tablet Take 1 tablet (40 mg total) by mouth daily. 30 tablet 3   valsartan (DIOVAN) 160 MG tablet Take 2 tabs daily. 60 tablet 0   No current facility-administered medications on file prior to visit.    BP 140/90   Pulse 96   Temp 98 F (36.7 C)   Resp 18   Ht '5\' 2"'$  (1.575 m)   Wt 216 lb (98 kg)   LMP 04/13/2020   SpO2 99%   BMI 39.51 kg/m       Objective:   Physical Exam  General Mental Status- Alert. General Appearance- Not in acute distress.   Skin General: Color- Normal Color. Moisture- Normal Moisture.  Neck Carotid Arteries- Normal color. Moisture- Normal Moisture. No carotid bruits. No JVD.  Chest and Lung Exam Auscultation: Breath Sounds:-Normal.  Cardiovascular Auscultation:Rythm- Regular. Murmurs & Other Heart Sounds:Auscultation of the heart reveals- No Murmurs.   Neurologic Cranial Nerve exam:- CN III-XII intact(No nystagmus), symmetric smile. Strength:- 5/5 equal and symmetric strength both upper and lower extremities.    Left hand- demonstrated easy triggering of thumb.     Assessment & Plan:   Patient Instructions  Your bp is borderline level 140/90 when I checked and higher when MA checked. I think you wrist cuff is reading high. Recommend getting new otc electronic cuff.   At home manual cuff and mother electronic cuff matching. Both moderate well controlled readings.  Recommend just using valsartan 160 mg daily and hctz 12.5 mg daily. Continue to check bp  daily.  For left trigger finger let me know when you get new insurance then will place the referral to hand specialist.  Follow up Nov 03, 2021 for bp check or sooner if need    Mackie Pai, PA-C

## 2021-10-26 ENCOUNTER — Other Ambulatory Visit (HOSPITAL_BASED_OUTPATIENT_CLINIC_OR_DEPARTMENT_OTHER): Payer: Self-pay

## 2021-11-03 ENCOUNTER — Ambulatory Visit: Payer: Managed Care, Other (non HMO) | Admitting: Medical

## 2021-12-01 ENCOUNTER — Ambulatory Visit: Payer: Self-pay | Admitting: Obstetrics & Gynecology

## 2022-01-09 ENCOUNTER — Other Ambulatory Visit: Payer: Self-pay | Admitting: Medical

## 2022-04-10 ENCOUNTER — Other Ambulatory Visit: Payer: Self-pay | Admitting: Medical

## 2022-04-25 ENCOUNTER — Ambulatory Visit
Admission: EM | Admit: 2022-04-25 | Discharge: 2022-04-25 | Disposition: A | Discharge status: Home / Self Care | Payer: 59 | Attending: Urgent Care | Admitting: Urgent Care

## 2022-04-25 DIAGNOSIS — J309 Allergic rhinitis, unspecified: Secondary | ICD-10-CM | POA: Insufficient documentation

## 2022-04-25 DIAGNOSIS — R062 Wheezing: Secondary | ICD-10-CM

## 2022-04-25 DIAGNOSIS — R0981 Nasal congestion: Secondary | ICD-10-CM | POA: Insufficient documentation

## 2022-04-25 DIAGNOSIS — Z7952 Long term (current) use of systemic steroids: Secondary | ICD-10-CM | POA: Insufficient documentation

## 2022-04-25 DIAGNOSIS — Z1152 Encounter for screening for COVID-19: Secondary | ICD-10-CM | POA: Diagnosis not present

## 2022-04-25 DIAGNOSIS — J4530 Mild persistent asthma, uncomplicated: Secondary | ICD-10-CM | POA: Insufficient documentation

## 2022-04-25 DIAGNOSIS — I10 Essential (primary) hypertension: Secondary | ICD-10-CM | POA: Diagnosis not present

## 2022-04-25 DIAGNOSIS — Z79899 Other long term (current) drug therapy: Secondary | ICD-10-CM | POA: Insufficient documentation

## 2022-04-25 MED ORDER — LEVOCETIRIZINE DIHYDROCHLORIDE 5 MG PO TABS: 5.0000 mg | ORAL_TABLET | Freq: Every evening | ORAL | 0 refills | Status: DC

## 2022-04-25 MED ORDER — PREDNISONE 10 MG PO TABS: 30.0000 mg | ORAL_TABLET | Freq: Every day | ORAL | 0 refills | Status: DC

## 2022-04-25 NOTE — ED Triage Notes (Signed)
Pt c/o nasal congestion, cough, chest tightness/wheezing sx started last night after home manicure-states hx of same after manicure x 3-NAD-steady gait

## 2022-04-25 NOTE — ED Provider Notes (Signed)
[facility]  Note:  This document was prepared using Systems analyst and may include unintentional dictation errors.  MRN: [id number] DOB: 1980/08/17  Subjective:   Traci Frank is a 41 y.o. female presenting for 1 day history of acute onset persistent chest tightness, wheezing, coughing, sinus congestion, sinus pressure.  Patient reports that she did a home manicure and previously had a reaction like this.  She has tried using her Advair.  States that she is also used her albuterol.  She is not consistent with her Advair as she is not sure how to use it.  Also has a history of allergic rhinitis.  She recently started taking her antihistamine again in the past week.  Regarding her blood pressure, reports that there have been several changes to her blood pressure medications over the past few months as her PCP is working with her and controlling it.  She is supposed to be taking hydrochlorothiazide and valsartan.  She took her blood pressure medicine a few minutes before coming into the clinic.  No current facility-administered medications for this encounter.  Current Outpatient Medications:    albuterol (VENTOLIN HFA) 108 (90 Base) MCG/ACT inhaler, Inhale 2 puffs into the lungs every 4 (four) hours as needed for wheezing or shortness of breath., Disp: , Rfl:    cetirizine (ZYRTEC) 10 MG tablet, Take 10 mg by mouth daily in the afternoon. , Disp: , Rfl:    fluticasone (FLONASE) 50 MCG/ACT nasal spray, Place 2 sprays into both nostrils daily., Disp: 16 g, Rfl: 1   fluticasone-salmeterol (ADVAIR) 100-50 MCG/ACT AEPB, Inhale 1 puff into the lungs 2 (two) times daily., Disp: 1 each, Rfl: 3   hydrochlorothiazide (MICROZIDE) 12.5 MG capsule, TAKE 1 CAPSULE BY MOUTH EVERY DAY, Disp: 30 capsule, Rfl: 0   HYDROcodone bit-homatropine (HYCODAN) 5-1.5 MG/5ML syrup, Take 5 mLs by mouth every 8 (eight) hours as needed for cough., Disp: 120 mL, Rfl: 0   linaCLOtide  (LINZESS PO), Take 1 tablet by mouth daily as needed., Disp: , Rfl:    metFORMIN (GLUCOPHAGE) 500 MG tablet, TAKE 1 TABLET BY MOUTH EVERY DAY WITH BREAKFAST, Disp: 90 tablet, Rfl: 0   Multiple Vitamin (THERA) TABS, Take 1 tablet by mouth daily., Disp: , Rfl:    pantoprazole (PROTONIX) 40 MG tablet, Take 1 tablet (40 mg total) by mouth daily., Disp: 30 tablet, Rfl: 3   valsartan (DIOVAN) 160 MG tablet, TAKE 1 TABLET BY MOUTH EVERY DAY, Disp: 30 tablet, Rfl: 0   Allergies  Allergen Reactions   Latex Itching    Past Medical History:  Diagnosis Date   Abnormal involuntary movements(781.0)    Right hand   Allergy    Anemia    Anxiety    Depression    Disturbance of skin sensation    Right arm   Headache(784.0)    Hypertension    Tingling sensation 05/27/2013   Tremor of unknown origin 05/27/2013   Uterine fibroid    Vertigo      Past Surgical History:  Procedure Laterality Date   HERNIA REPAIR     HYSTERECTOMY ABDOMINAL WITH SALPINGECTOMY Bilateral 05/07/2020   Procedure: EXPLORATORY LAPAROTOMY,TOTAL HYSTERECTOMY ABDOMINAL WITH BILATERAL  SALPINGECTOMY GREATER THAN 250GM;  Surgeon: Everitt Amber, MD;  Location: [facility];  Service: Gynecology;  Laterality: Bilateral;   MYOMECTOMY     OMENTECTOMY N/A 05/07/2020   Procedure: OMENTECTOMY, RADICAL TUMOR DEBULKING;  Surgeon: Everitt Amber, MD;  Location: [facility];  Service: Gynecology;  Laterality: N/A;    Family History  Problem Relation Age of Onset   Hypertension Mother    Drug abuse Father    Diabetes Maternal Grandmother    Diabetes Maternal Grandfather    Stroke Paternal Grandfather    Bipolar disorder Cousin     Social History   Tobacco Use   Smoking status: Never   Smokeless tobacco: Never  Vaping Use   Vaping Use: Never used  Substance Use Topics   Alcohol use: Yes    Alcohol/week: 0.0 standard drinks of alcohol    Comment: rarely   Drug use: No    ROS   Objective:   Vitals: BP (!) 172/117 (BP Location: Left  Arm) Comment: states PCP is aware and adjusting meds  Pulse 86   Temp 99.4 F (37.4 C) (Oral)   Resp 16   LMP 04/13/2020   SpO2 97%   BP Readings from Last 3 Encounters:  04/25/22 (!) 172/117  10/25/21 140/90  10/01/21 (!) 150/92   Recheck of BP 171/101 on left arm.  Physical Exam Constitutional:      General: She is not in acute distress.    Appearance: Normal appearance. She is well-developed and normal weight. She is not ill-appearing, toxic-appearing or diaphoretic.  HENT:     Head: Normocephalic and atraumatic.     Right Ear: Tympanic membrane, ear canal and external ear normal. No drainage or tenderness. No middle ear effusion. There is no impacted cerumen. Tympanic membrane is not erythematous or bulging.     Left Ear: Tympanic membrane, ear canal and external ear normal. No drainage or tenderness.  No middle ear effusion. There is no impacted cerumen. Tympanic membrane is not erythematous or bulging.     Nose: Congestion present. No rhinorrhea.     Mouth/Throat:     Mouth: Mucous membranes are moist. No oral lesions.     Pharynx: No pharyngeal swelling, oropharyngeal exudate, posterior oropharyngeal erythema or uvula swelling.     Tonsils: No tonsillar exudate or tonsillar abscesses.  Eyes:     General: No scleral icterus.       Right eye: No discharge.        Left eye: No discharge.     Extraocular Movements: Extraocular movements intact.     Right eye: Normal extraocular motion.     Left eye: Normal extraocular motion.     Conjunctiva/sclera: Conjunctivae normal.  Cardiovascular:     Rate and Rhythm: Normal rate.  Pulmonary:     Effort: Pulmonary effort is normal.  Musculoskeletal:     Cervical back: Normal range of motion and neck supple.  Lymphadenopathy:     Cervical: No cervical adenopathy.  Skin:    General: Skin is warm and dry.  Neurological:     General: No focal deficit present.     Mental Status: She is alert and oriented to person, place, and time.   Psychiatric:        Mood and Affect: Mood normal.        Behavior: Behavior normal.        Thought Content: Thought content normal.        Judgment: Judgment normal.     Assessment and Plan :   PDMP not reviewed this encounter.  1. Wheezing   2. Sinus congestion   3. Allergic rhinitis, unspecified seasonality, unspecified trigger   4. Mild persistent reactive airway disease without complication   5. Essential hypertension     Suspect patient is having  a flareup of her reactive airway disease.  Her blood pressure substantially elevated and therefore I did not recommend doing a steroid injection in clinic.  We will also use a lower dose of prednisone and I normally do at 30 mg for 5 days.  Recommended supportive care otherwise.  We will also test for COVID as she would be high risk should she have this particular infection.  Patient would be a good candidate for Paxlovid should she test positive.  No history of CKD, last GFR was 83.96 from October 2022.  Deferred repeat blood test given that her creatinine level from 09/17/2021 remained within normal limits.  Counseled patient on potential for adverse effects with medications prescribed/recommended today, ER and return-to-clinic precautions discussed, patient verbalized understanding.    Jaynee Eagles, [place] 04/26/22 [phone]

## 2022-04-25 NOTE — Discharge Instructions (Signed)
Stop Advair. Will use an oral prednisone course to address an allergic rhinitis and reactive airway flare. Start Xyzal (levocetirizine) every day.   For diabetes or elevated blood sugar, please make sure you are limiting and avoiding starchy, carbohydrate foods like pasta, breads, sweet breads, pastry, rice, potatoes, desserts. These foods can elevate your blood sugar. Also, limit and avoid drinks that contain a lot of sugar such as sodas, sweet teas, fruit juices.  Drinking plain water will be much more helpful, try 64 ounces of water daily.  It is okay to flavor your water naturally by cutting cucumber, lemon, mint or lime, placing it in a picture with water and drinking it over a period of 24-48 hours as long as it remains refrigerated.  For elevated blood pressure, make sure you are monitoring salt in your diet.  Do not eat restaurant foods and limit processed foods at home. I highly recommend you prepare and cook your own foods at home.  Processed foods include things like frozen meals, pre-seasoned meats and dinners, deli meats, canned foods as these foods contain a high amount of sodium/salt.  Make sure you are paying attention to sodium labels on foods you buy at the grocery store. Buy your spices separately such as garlic powder, onion powder, cumin, cayenne, parsley flakes so that you can avoid seasonings that contain salt. However, salt-free seasonings are available and can be used, an example is Mrs. Dash and includes a lot of different mixtures that do not contain salt.  Lastly, when cooking using oils that are healthier for you is important. This includes olive oil, avocado oil, canola oil. We have discussed a lot of foods to avoid but below is a list of foods that can be very healthy to use in your diet whether it is for diabetes, cholesterol, high blood pressure, or in general healthy eating.  Salads - kale, spinach, cabbage, spring mix, arugula Fruits - avocadoes, berries (blueberries,  raspberries, blackberries), apples, oranges, pomegranate, grapefruit, kiwi Vegetables - asparagus, cauliflower, broccoli, green beans, brussel sprouts, bell peppers, beets; stay away from or limit starchy vegetables like potatoes, carrots, peas Other general foods - kidney beans, egg whites, almonds, walnuts, sunflower seeds, pumpkin seeds, fat free yogurt, almond milk, flax seeds, quinoa, oats  Meat - It is better to eat lean meats and limit your red meat including pork to once a week.  Wild caught fish, chicken breast are good options as they tend to be leaner sources of good protein. Still be mindful of the sodium labels for the meats you buy.  DO NOT EAT ANY FOODS ON THIS LIST THAT YOU ARE ALLERGIC TO. For more specific needs, I highly recommend consulting a dietician or nutritionist but this can definitely be a good starting point.

## 2022-04-27 LAB — SARS CORONAVIRUS 2 (TAT 6-24 HRS): SARS Coronavirus 2: NEGATIVE

## 2022-05-16 ENCOUNTER — Other Ambulatory Visit: Payer: Self-pay | Admitting: Medical

## 2022-06-30 IMAGING — MR MR PELVIS WO/W CM
12 series · 45 of 48 positions shown · IV contrast (gadavist)
Comparison: 02/27/2020

CLINICAL DATA: Chronic abdominal pain with nausea and vomiting,
peritoneal implants on recent CT abdomen

EXAM:
MRI ABDOMEN AND PELVIS WITHOUT AND WITH CONTRAST
TECHNIQUE: Multiplanar multisequence MR imaging of the abdomen and pelvis was
performed both before and after the administration of intravenous
contrast.
CONTRAST:  10mL GADAVIST GADOBUTROL 1 MMOL/ML IV SOLN; 10mL GADAVIST
GADOBUTROL 1 MMOL/ML IV SOLN, <See Chart> GADAVIST GADOBUTROL 1
MMOL/ML IV SOLN

[Series 5: cor haste · coronal · 6.0mm · 1.19mm/px · 4 of 30 slices shown]
[im 1/30]
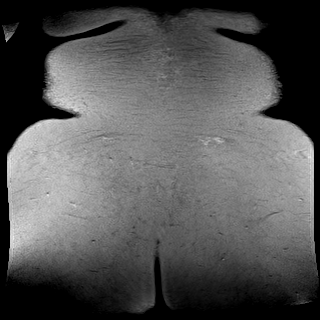
[im 10/30]
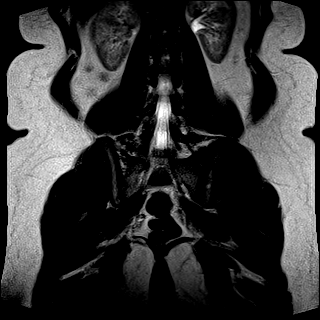
[im 20/30]
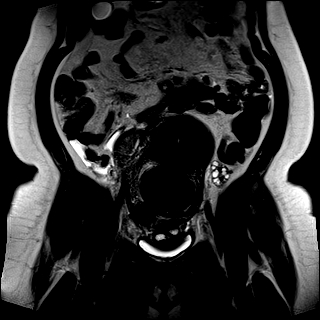
[im 30/30]
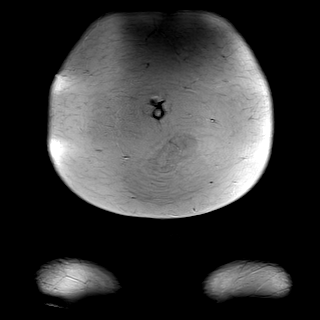

[Series 6: ax haste · axial · 6.0mm · 1.19mm/px · z∈[-425,-144]mm · 4 of 40 slices shown]
[im 1/40]
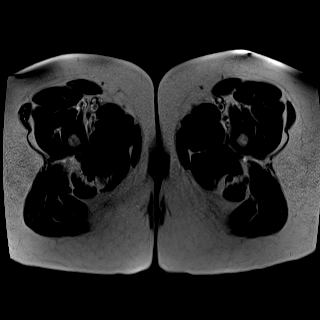
[im 14/40]
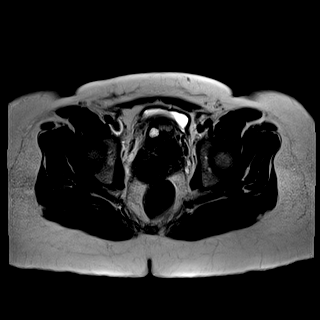
[im 27/40]
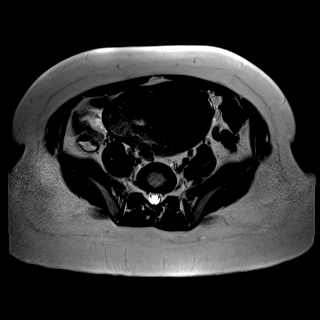
[im 40/40]
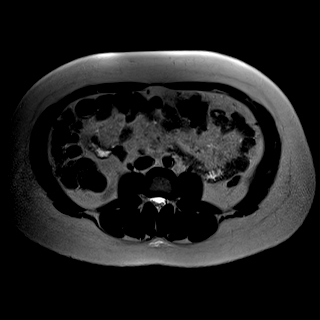

[Series 7: T2 fat-sat · axial · 6.0mm · 1.19mm/px · z∈[-425,-144]mm · 4 of 40 slices shown]
[im 1/40]
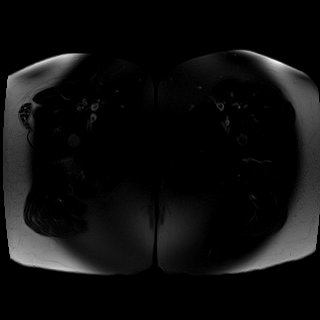
[im 14/40]
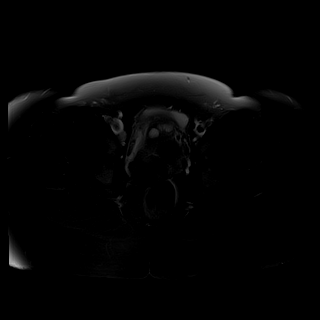
[im 27/40]
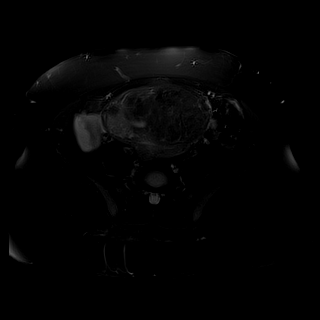
[im 40/40]
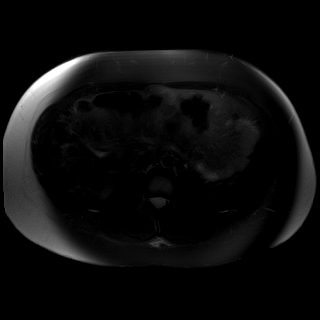

[Series 8: t1_vibe_opp-in_tra_p4_bh · axial · 3.0mm · 1.19mm/px · z∈[-403,-166]mm · 7 of 80 slices shown (1 of 2)]
[im 1/80]
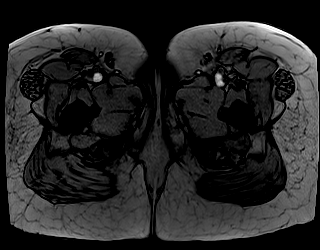
[im 14/80]
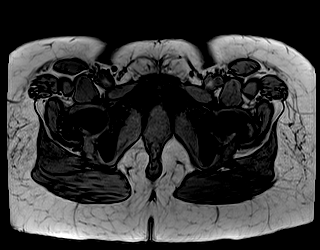
[im 27/80]
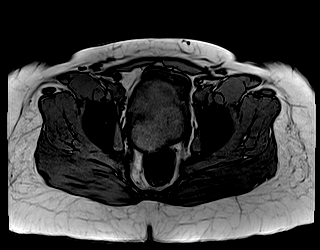
[im 40/80]
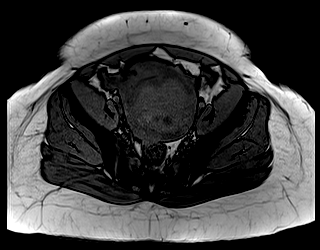
[im 53/80]
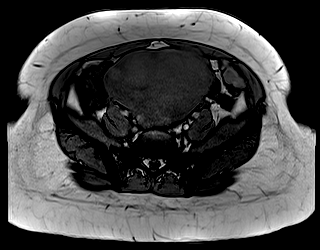
[im 66/80]
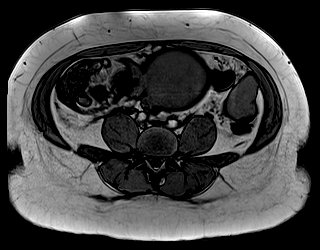
[im 80/80]
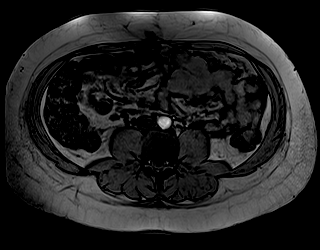

[Series 8: t1_vibe_opp-in_tra_p4_bh · axial · 3.0mm · 1.19mm/px · z∈[-403,-166]mm · 7 of 80 slices shown (2 of 2)]
[im 1/80]
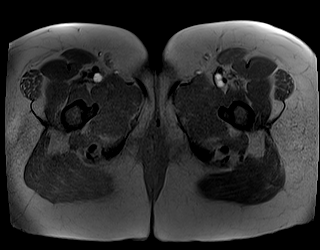
[im 14/80]
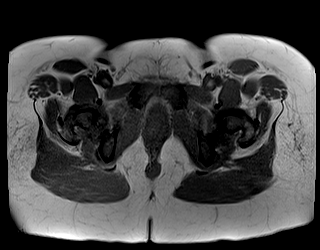
[im 27/80]
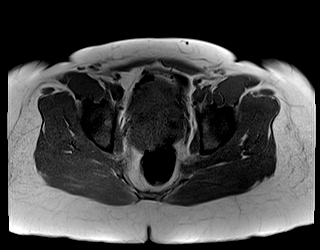
[im 40/80]
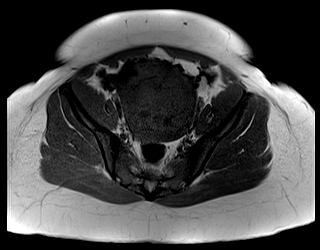
[im 53/80]
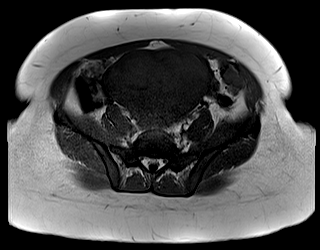
[im 66/80]
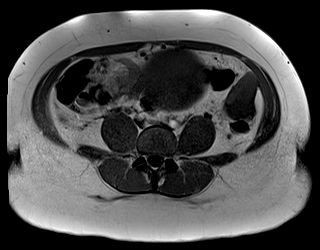
[im 80/80]
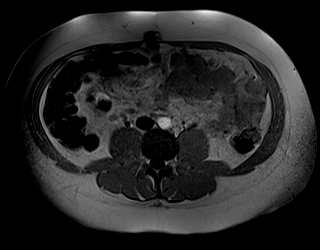

[Series 10: bSSFP · axial · 6.0mm · 0.70mm/px · z∈[-425,-144]mm · 3 of 40 slices shown]
[im 1/40]
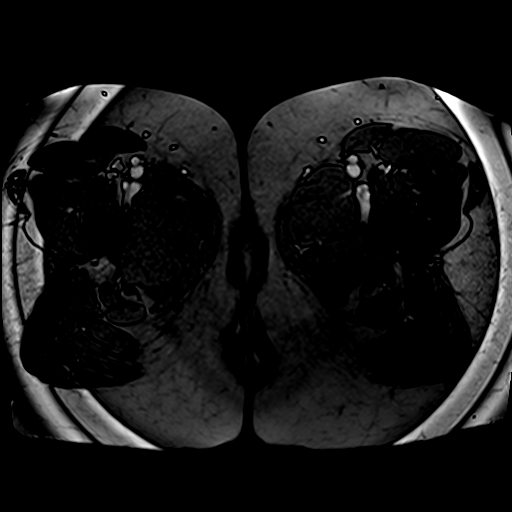
[im 20/40]
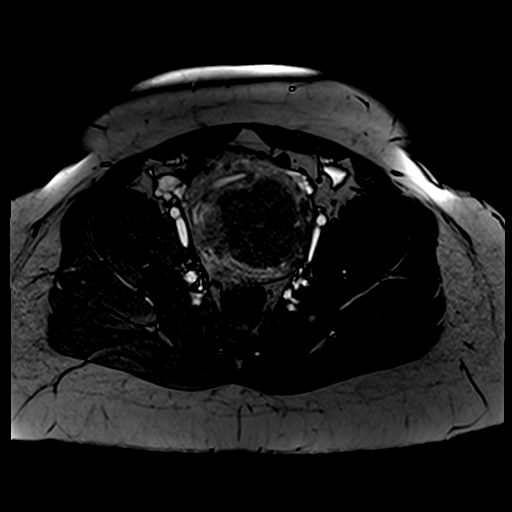
[im 40/40]
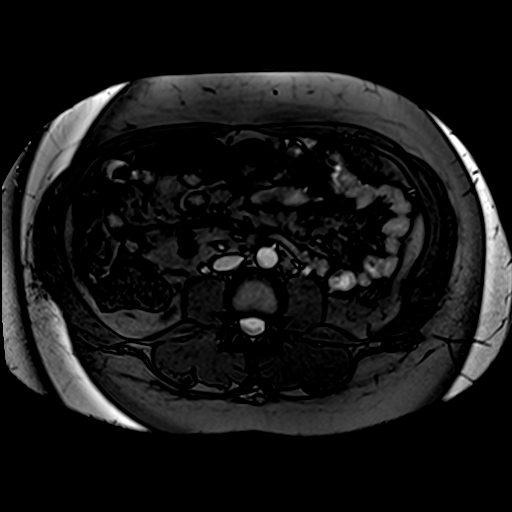

[Series 11: sag tse · sagittal · 5.0mm · 0.81mm/px · 3 of 33 slices shown]
[im 1/33]
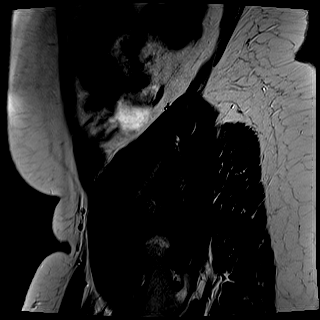
[im 17/33]
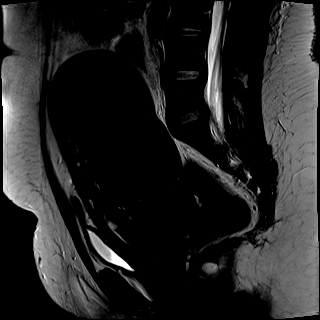
[im 33/33]
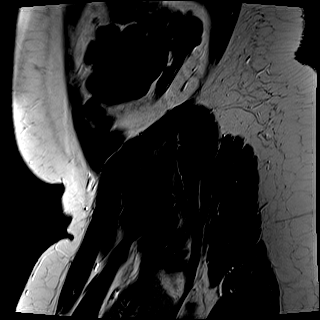

[Series 12: ax tse · axial · 5.0mm · 0.81mm/px · z∈[-402,-168]mm · 3 of 40 slices shown]
[im 1/40]
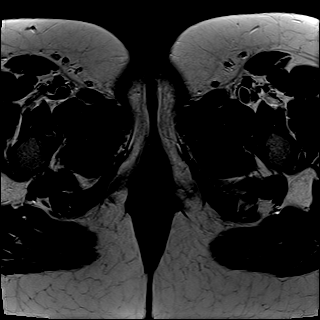
[im 20/40]
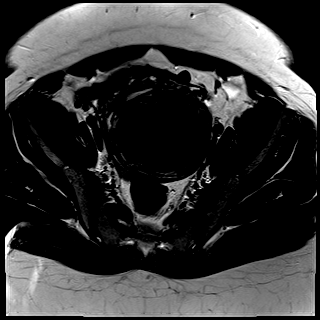
[im 40/40]
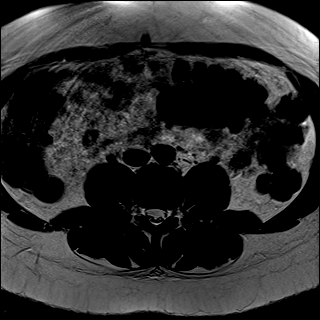

[Series 13: T1 · axial · 5.0mm · 1.02mm/px · z∈[-402,-168]mm · 3 of 40 slices shown]
[im 1/40]
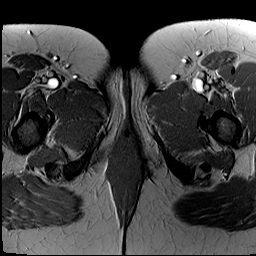
[im 20/40]
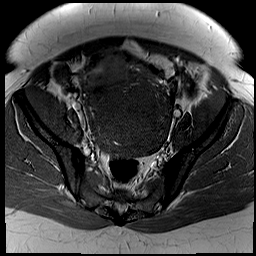
[im 40/40]
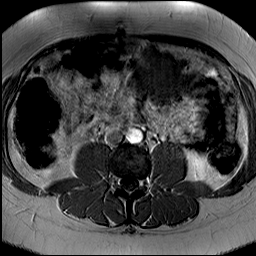

[Series 14: T1 fat-sat · axial · non-contrast · 5.0mm · 1.02mm/px · z∈[-402,-168]mm · 3 of 40 slices shown]
[im 1/40]
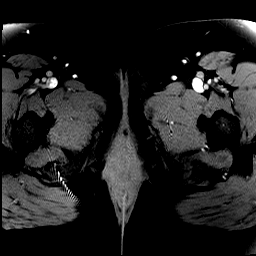
[im 20/40]
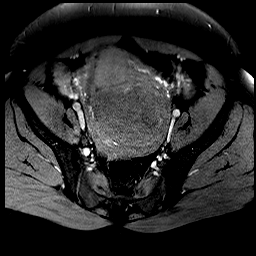
[im 40/40]
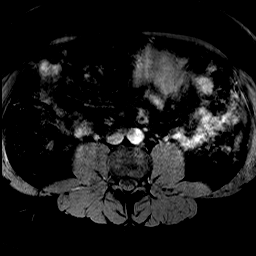

[Series 30: T1 fat-sat post-contrast · axial · 5.0mm · 1.02mm/px · z∈[-396,-162]mm · 3 of 40 slices shown (1 of 2)]
[im 1/40]
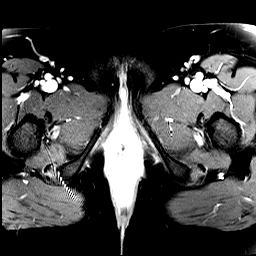
[im 20/40]
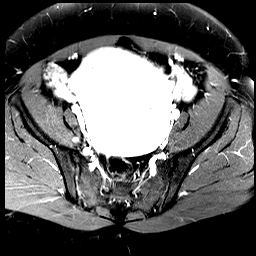
[im 40/40]
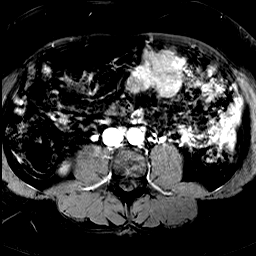

[Series 31: T1 fat-sat post-contrast · axial · 6.0mm · 1.25mm/px · 1 of 50 slices shown (2 of 2)]
[im 1/50]
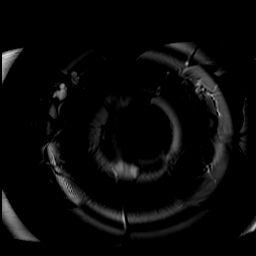

[45 of 48 positions shown; findings below may reference images not displayed]

FINDINGS: COMBINED FINDINGS FOR BOTH MR ABDOMEN AND PELVIS

Despite efforts by the technologist and patient, motion artifact is
present on today's exam and could not be eliminated. This reduces
exam sensitivity and specificity.

Lower chest: Unremarkable

Hepatobiliary: Unremarkable

Pancreas:  Unremarkable

Spleen:  Unremarkable

Adrenals/Urinary Tract:  Unremarkable

Stomach/Bowel: Unremarkable

Vascular/Lymphatic:  No pathologic adenopathy

Reproductive: Markedly enlarged uterus measuring 18.5 by 8.9 by
cm (volume = 8811 cm^3), containing multiple enhancing intramural
fibroids.

Both ovaries appear normal, with the right ovary measuring 3.3 by
2.6 by 2.9 cm and the left ovary measuring 3.2 by 2.4 by 3.7 cm.

Other: Scattered along the omentum there are various masses with
intermediate T1 and generally low T2 signal intensity which appear
to diffusely enhance. These correspond to the lesions seen at CT and
favor the pelvic region with mild eccentricity to the left multiple
lesions are identified including a smaller 3.5 by 2.5 cm lesion just
posterior to the umbilicus on image 5 of series 6, as well as a
multilobular lesion along the left omentum measuring 6.7 by 5.7 cm
on image 7 of series 6. Given the diffuse enhancement and lack of
accentuated precontrast T1 signal characteristics on fat saturated
imaging, I am skeptical that these represent endometriomas.
Possibilities include disseminated peritoneal leiomyomatosis or
peritoneal spread of malignancy.

Small amount of free pelvic fluid in the right paracolic gutter.

Musculoskeletal: Unremarkable
IMPRESSION: 1. Enhancing masses along the omentum representing either
disseminated peritoneal leiomyomatosis or peritoneal spread of
malignancy. No ovarian mass is identified. Tissue diagnosis
recommended.
2. Markedly enlarged uterus containing multiple enhancing intramural
fibroids.
3. Small amount of free pelvic fluid in the right paracolic gutter.

## 2022-06-30 IMAGING — MR MR ABDOMEN WO/W CM
9 of 14 series · 32 of 48 positions shown · IV contrast (gadavist)
Comparison: 02/27/2020

CLINICAL DATA: Chronic abdominal pain with nausea and vomiting,
peritoneal implants on recent CT abdomen

EXAM:
MRI ABDOMEN AND PELVIS WITHOUT AND WITH CONTRAST
TECHNIQUE: Multiplanar multisequence MR imaging of the abdomen and pelvis was
performed both before and after the administration of intravenous
contrast.
CONTRAST:  10mL GADAVIST GADOBUTROL 1 MMOL/ML IV SOLN; 10mL GADAVIST
GADOBUTROL 1 MMOL/ML IV SOLN, <See Chart> GADAVIST GADOBUTROL 1
MMOL/ML IV SOLN

[Series 15: cor haste · coronal · 6.0mm · 1.19mm/px · 1 of 30 slices shown]
[im 1/30]
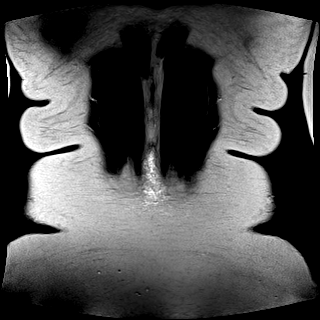

[Series 18: T2 fat-sat · axial · 6.0mm · 1.19mm/px · z∈[-196,+49]mm · 2 of 35 slices shown]
[im 1/35]
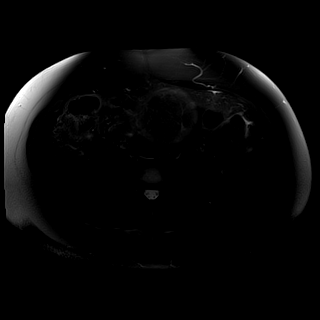
[im 35/35]
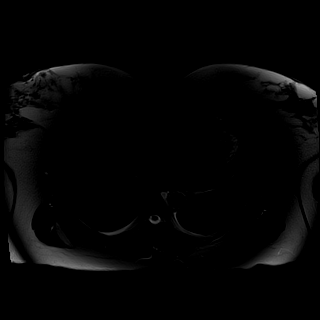

[Series 19: ax haste · axial · 6.0mm · 1.00mm/px · z∈[-214,+67]mm · 2 of 40 slices shown]
[im 1/40]
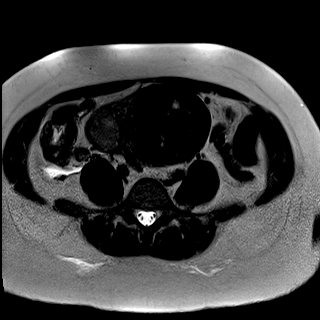
[im 40/40]
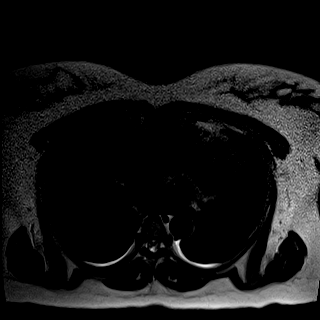

[Series 20: ax in and · axial · 3.0mm · 1.19mm/px · z∈[-179,+58]mm · 4 of 80 slices shown (1 of 2)]
[im 1/80]
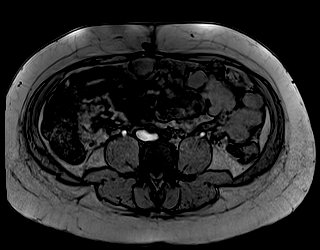
[im 27/80]
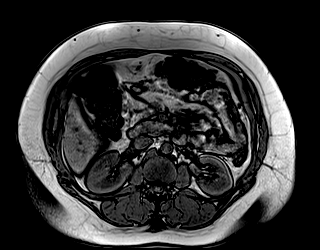
[im 53/80]
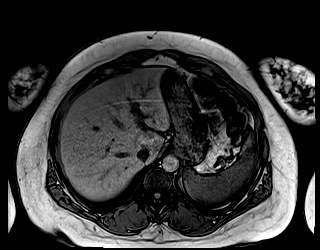
[im 80/80]
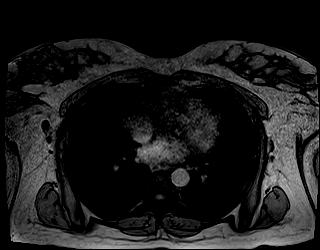

[Series 20: ax in and · axial · 3.0mm · 1.19mm/px · z∈[-179,+58]mm · 4 of 80 slices shown (2 of 2)]
[im 1/80]
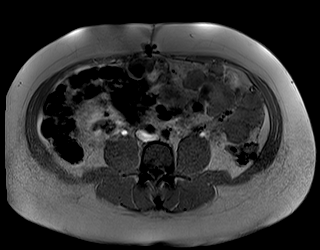
[im 27/80]
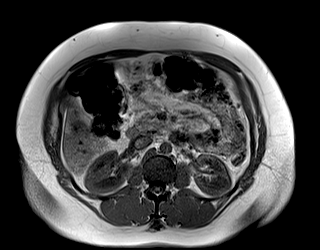
[im 53/80]
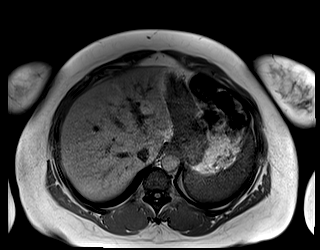
[im 80/80]
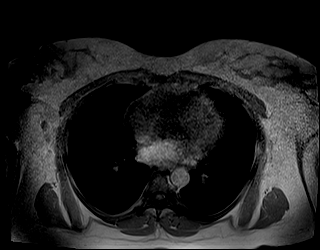

[Series 21: T1 dynamic · axial · 1.4mm · 0.78mm/px · z∈[-164,+36]mm · 7 of 144 slices shown (1 of 2)]
[im 1/144]
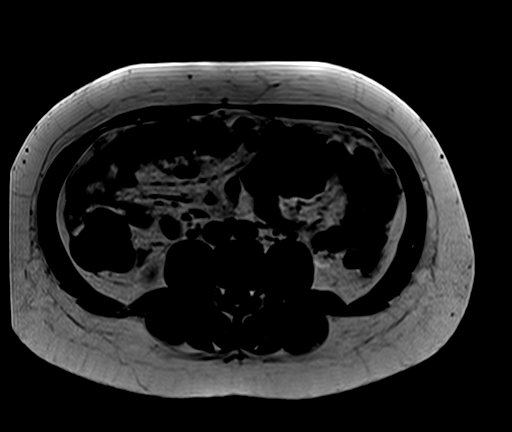
[im 24/144]
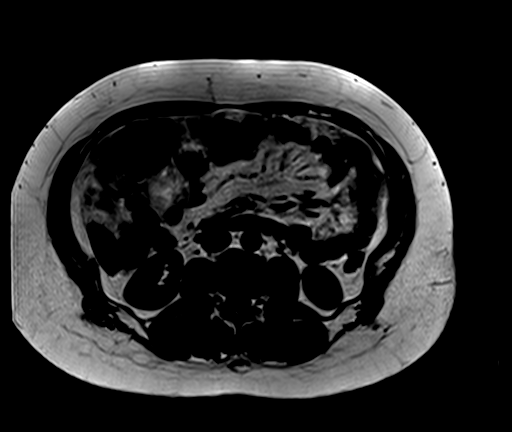
[im 48/144]
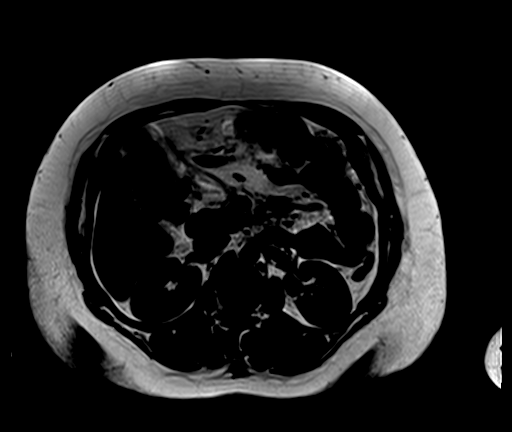
[im 72/144]
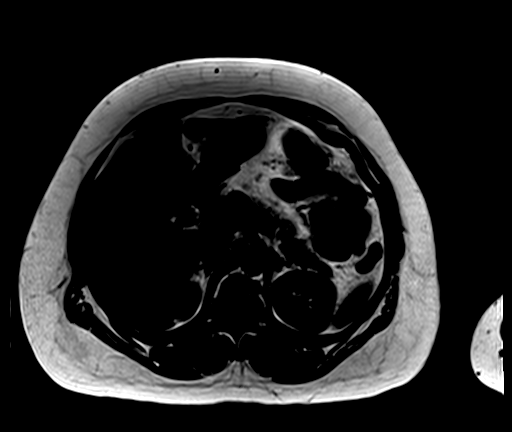
[im 96/144]
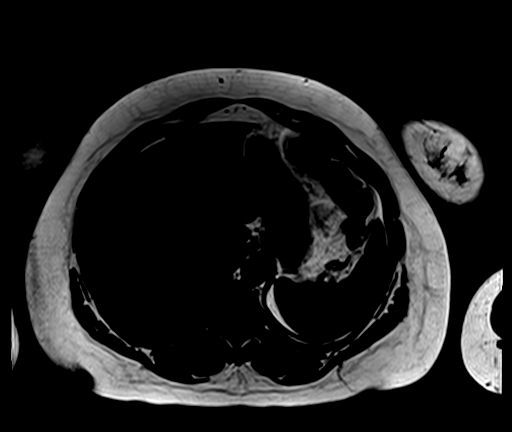
[im 120/144]
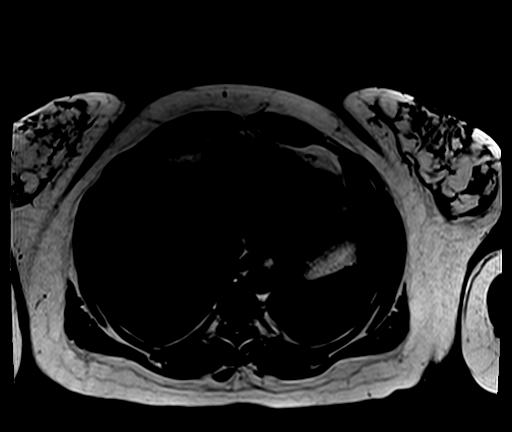
[im 144/144]
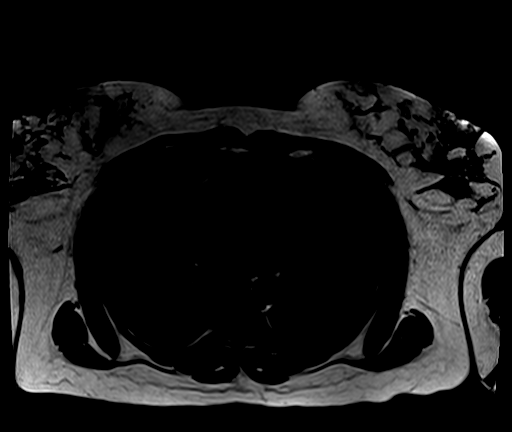

[Series 22: T1 dynamic · axial · 1.4mm · 0.78mm/px · z∈[-164,+36]mm · 7 of 144 slices shown (2 of 2)]
[im 1/144]
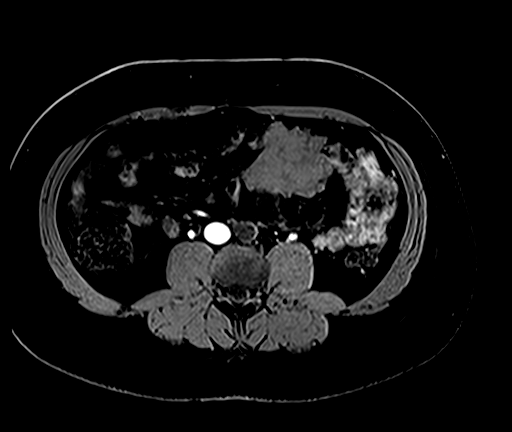
[im 24/144]
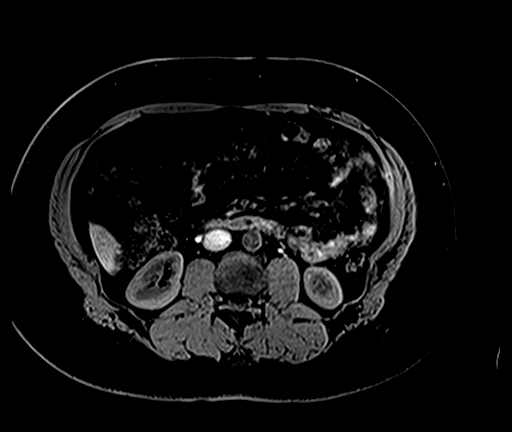
[im 48/144]
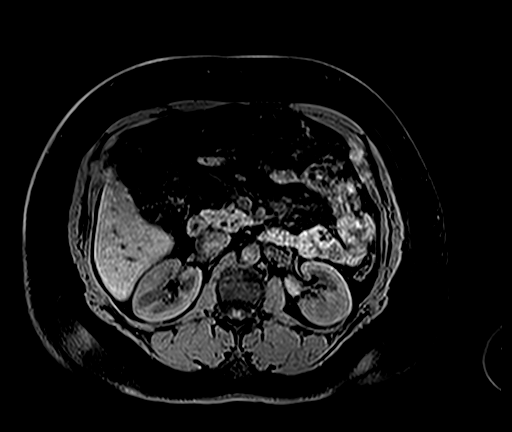
[im 72/144]
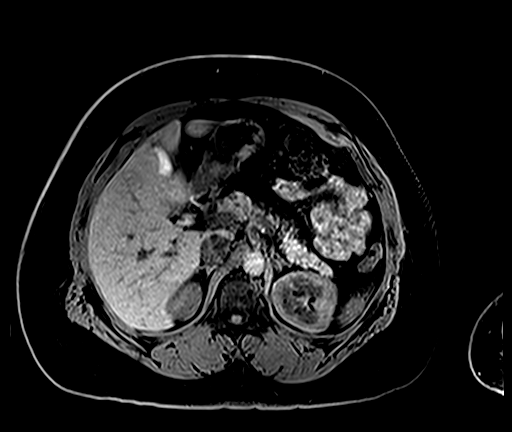
[im 96/144]
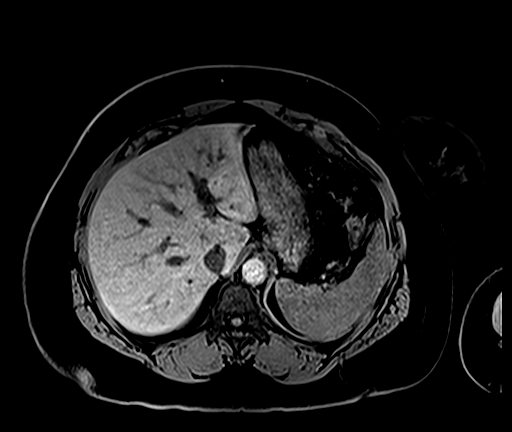
[im 120/144]
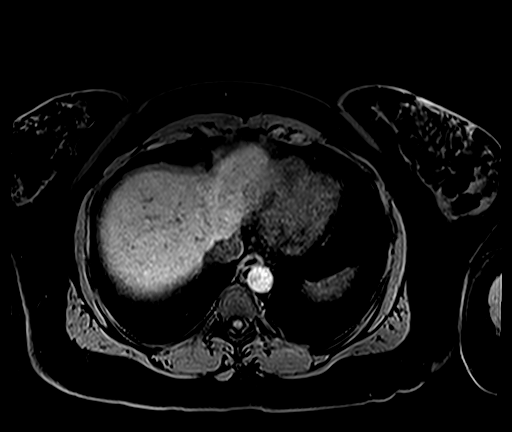
[im 144/144]
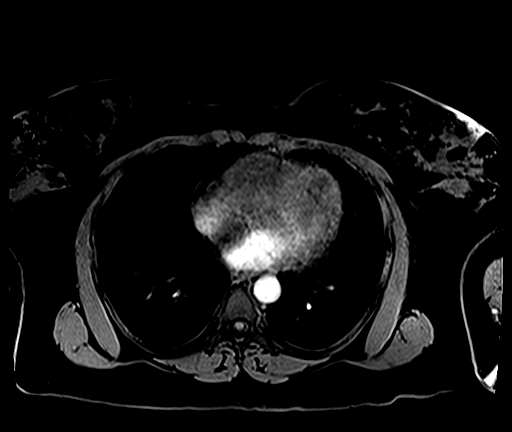

[Series 23: T1 dynamic post-contrast · axial · 3.0mm · 1.19mm/px · z∈[-170,+43]mm · 3 of 72 slices shown (1 of 2)]
[im 1/72]
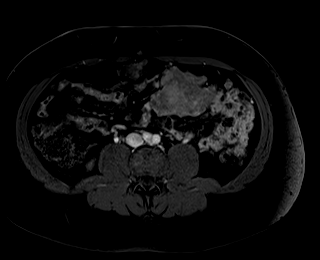
[im 36/72]
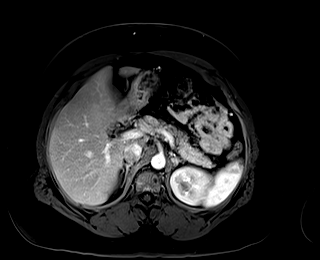
[im 72/72]
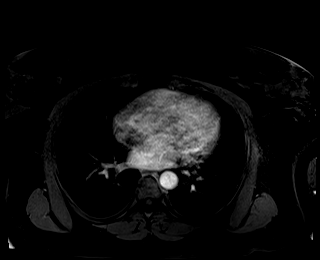

[Series 24: T1 dynamic post-contrast · axial · 3.0mm · 1.19mm/px · z∈[-170,-65]mm · 2 of 72 slices shown (2 of 2)]
[im 1/72]
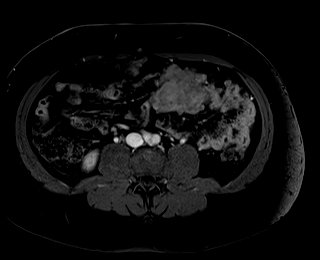
[im 36/72]
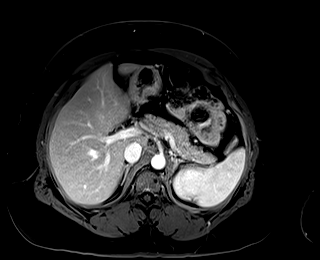

[32 of 48 positions shown; findings below may reference images not displayed]

FINDINGS: COMBINED FINDINGS FOR BOTH MR ABDOMEN AND PELVIS

Despite efforts by the technologist and patient, motion artifact is
present on today's exam and could not be eliminated. This reduces
exam sensitivity and specificity.

Lower chest: Unremarkable

Hepatobiliary: Unremarkable

Pancreas:  Unremarkable

Spleen:  Unremarkable

Adrenals/Urinary Tract:  Unremarkable

Stomach/Bowel: Unremarkable

Vascular/Lymphatic:  No pathologic adenopathy

Reproductive: Markedly enlarged uterus measuring 18.5 by 8.9 by
cm (volume = 8811 cm^3), containing multiple enhancing intramural
fibroids.

Both ovaries appear normal, with the right ovary measuring 3.3 by
2.6 by 2.9 cm and the left ovary measuring 3.2 by 2.4 by 3.7 cm.

Other: Scattered along the omentum there are various masses with
intermediate T1 and generally low T2 signal intensity which appear
to diffusely enhance. These correspond to the lesions seen at CT and
favor the pelvic region with mild eccentricity to the left multiple
lesions are identified including a smaller 3.5 by 2.5 cm lesion just
posterior to the umbilicus on image 5 of series 6, as well as a
multilobular lesion along the left omentum measuring 6.7 by 5.7 cm
on image 7 of series 6. Given the diffuse enhancement and lack of
accentuated precontrast T1 signal characteristics on fat saturated
imaging, I am skeptical that these represent endometriomas.
Possibilities include disseminated peritoneal leiomyomatosis or
peritoneal spread of malignancy.

Small amount of free pelvic fluid in the right paracolic gutter.

Musculoskeletal: Unremarkable
IMPRESSION: 1. Enhancing masses along the omentum representing either
disseminated peritoneal leiomyomatosis or peritoneal spread of
malignancy. No ovarian mass is identified. Tissue diagnosis
recommended.
2. Markedly enlarged uterus containing multiple enhancing intramural
fibroids.
3. Small amount of free pelvic fluid in the right paracolic gutter.

## 2022-07-22 ENCOUNTER — Ambulatory Visit
Admission: EM | Admit: 2022-07-22 | Discharge: 2022-07-22 | Disposition: A | Discharge status: Home / Self Care | Payer: 59 | Attending: Urgent Care | Admitting: Urgent Care

## 2022-07-22 DIAGNOSIS — H6122 Impacted cerumen, left ear: Secondary | ICD-10-CM

## 2022-07-22 DIAGNOSIS — Z1152 Encounter for screening for COVID-19: Secondary | ICD-10-CM | POA: Diagnosis not present

## 2022-07-22 DIAGNOSIS — I10 Essential (primary) hypertension: Secondary | ICD-10-CM | POA: Diagnosis present

## 2022-07-22 DIAGNOSIS — J069 Acute upper respiratory infection, unspecified: Secondary | ICD-10-CM

## 2022-07-22 MED ORDER — CARBAMIDE PEROXIDE 6.5 % OT SOLN: 5.0000 [drp] | Freq: Every day | OTIC | 0 refills | Status: DC | PRN

## 2022-07-22 MED ORDER — ACETAMINOPHEN 325 MG PO TABS: 650.0000 mg | ORAL_TABLET | Freq: Four times a day (QID) | ORAL | 0 refills | Status: DC | PRN

## 2022-07-22 MED ORDER — IPRATROPIUM BROMIDE 0.03 % NA SOLN: 2.0000 | Freq: Two times a day (BID) | NASAL | 0 refills | Status: DC

## 2022-07-22 MED ORDER — AMLODIPINE BESYLATE 5 MG PO TABS: 5.0000 mg | ORAL_TABLET | Freq: Every day | ORAL | 0 refills | Status: DC

## 2022-07-22 NOTE — Discharge Instructions (Addendum)
We will notify you of your test results as they arrive and may take between about 24 hours.  I encourage you to sign up for MyChart if you have not already done so as this can be the easiest way for [place] to communicate results to you online or through a phone app.  Generally, we only contact you if it is a positive test result.  In the meantime, if you develop worsening symptoms including fever, chest pain, shortness of breath despite our current treatment plan then please report to the emergency room as this may be a sign of worsening status from possible viral infection.  Otherwise, we will manage this as a viral syndrome. For sore throat or cough try using a honey-based tea. Use 3 teaspoons of honey with juice squeezed from half lemon. Place shaved pieces of ginger into 1/2-1 cup of water and warm over stove top. Then mix the ingredients and repeat every 4 hours as needed. Please take Tylenol 553m-650mg every 6 hours for aches and pains, fevers. Hydrate very well with at least 2 liters of water. Eat light meals such as soups to replenish electrolytes and soft fruits, veggies. Start an antihistamine like Zyrtec (185mdaily) for postnasal drainage, sinus congestion.  You can take this together with nasal spray.  Use the cough medications as needed. Do not use NSAIDs like Advil, ibuprofen, Motrin, Aleve.    For diabetes or elevated blood sugar, please make sure you are limiting and avoiding starchy, carbohydrate foods like pasta, breads, sweet breads, pastry, rice, potatoes, desserts. These foods can elevate your blood sugar. Also, limit and avoid drinks that contain a lot of sugar such as sodas, sweet teas, fruit juices.  Drinking plain water will be much more helpful, try 64 ounces of water daily.  It is okay to flavor your water naturally by cutting cucumber, lemon, mint or lime, placing it in a picture with water and drinking it over a period of 24-48 hours as long as it remains refrigerated.  For elevated  blood pressure, make sure you are monitoring salt in your diet.  Do not eat restaurant foods and limit processed foods at home. I highly recommend you prepare and cook your own foods at home.  Processed foods include things like frozen meals, pre-seasoned meats and dinners, deli meats, canned foods as these foods contain a high amount of sodium/salt.  Make sure you are paying attention to sodium labels on foods you buy at the grocery store. Buy your spices separately such as garlic powder, onion powder, cumin, cayenne, parsley flakes so that you can avoid seasonings that contain salt. However, salt-free seasonings are available and can be used, an example is Mrs. Dash and includes a lot of different mixtures that do not contain salt.  Lastly, when cooking using oils that are healthier for you is important. This includes olive oil, avocado oil, canola oil. We have discussed a lot of foods to avoid but below is a list of foods that can be very healthy to use in your diet whether it is for diabetes, cholesterol, high blood pressure, or in general healthy eating.  Salads - kale, spinach, cabbage, spring mix, arugula Fruits - avocadoes, berries (blueberries, raspberries, blackberries), apples, oranges, pomegranate, grapefruit, kiwi Vegetables - asparagus, cauliflower, broccoli, green beans, brussel sprouts, bell peppers, beets; stay away from or limit starchy vegetables like potatoes, carrots, peas Other general foods - kidney beans, egg whites, almonds, walnuts, sunflower seeds, pumpkin seeds, fat free yogurt, almond milk, flax  seeds, quinoa, oats  Meat - It is better to eat lean meats and limit your red meat including pork to once a week.  Wild caught fish, chicken breast are good options as they tend to be leaner sources of good protein. Still be mindful of the sodium labels for the meats you buy.  DO NOT EAT ANY FOODS ON THIS LIST THAT YOU ARE ALLERGIC TO. For more specific needs, I highly recommend  consulting a dietician or nutritionist but this can definitely be a good starting point.

## 2022-07-22 NOTE — ED Triage Notes (Signed)
Pt c/o nasal congestion, HA, left ear pain x 3 days-also requesting new BP med-NAD-steady gait-NAD-steady gait

## 2022-07-22 NOTE — ED Provider Notes (Signed)
[facility]  Note:  This document was prepared using Systems analyst and may include unintentional dictation errors.  MRN: [id number] DOB: December 03, 1980  Subjective:   Traci Frank is a 42 y.o. female presenting for 3 day history of acute onset sinus congestion, sinus pressure, sinus headaches, left ear pain.  Denies cough, chest pain, shortness of breath or wheezing heart racing, palpitations, vomiting, abdominal pain.  She would like to trial a new blood pressure medicine.  Is concerned that valsartan is not working and potentially making her pressure worse.  She has previously taken multiple blood pressure medications and has been changed for different reasons by her previous PCP.  She is in the process of looking for a new one due to inability to control her essential hypertension.  No history of stroke, heart disease, MI.  No current facility-administered medications for this encounter.  Current Outpatient Medications:    albuterol (VENTOLIN HFA) 108 (90 Base) MCG/ACT inhaler, Inhale 2 puffs into the lungs every 4 (four) hours as needed for wheezing or shortness of breath., Disp: , Rfl:    cetirizine (ZYRTEC) 10 MG tablet, Take 10 mg by mouth daily in the afternoon. , Disp: , Rfl:    fluticasone (FLONASE) 50 MCG/ACT nasal spray, Place 2 sprays into both nostrils daily., Disp: 16 g, Rfl: 1   hydrochlorothiazide (MICROZIDE) 12.5 MG capsule, TAKE 1 CAPSULE BY MOUTH EVERY DAY, Disp: 90 capsule, Rfl: 1   HYDROcodone bit-homatropine (HYCODAN) 5-1.5 MG/5ML syrup, Take 5 mLs by mouth every 8 (eight) hours as needed for cough., Disp: 120 mL, Rfl: 0   levocetirizine (XYZAL) 5 MG tablet, Take 1 tablet (5 mg total) by mouth every evening., Disp: 90 tablet, Rfl: 0   linaCLOtide (LINZESS PO), Take 1 tablet by mouth daily as needed., Disp: , Rfl:    metFORMIN (GLUCOPHAGE) 500 MG tablet, TAKE 1 TABLET BY MOUTH EVERY DAY WITH BREAKFAST, Disp: 90 tablet, Rfl: 0    Multiple Vitamin (THERA) TABS, Take 1 tablet by mouth daily., Disp: , Rfl:    pantoprazole (PROTONIX) 40 MG tablet, Take 1 tablet (40 mg total) by mouth daily., Disp: 30 tablet, Rfl: 3   predniSONE (DELTASONE) 10 MG tablet, Take 3 tablets (30 mg total) by mouth daily with breakfast., Disp: 15 tablet, Rfl: 0   valsartan (DIOVAN) 160 MG tablet, TAKE 1 TABLET BY MOUTH EVERY DAY, Disp: 90 tablet, Rfl: 1   Allergies  Allergen Reactions   Latex Itching    Past Medical History:  Diagnosis Date   Abnormal involuntary movements(781.0)    Right hand   Allergy    Anemia    Anxiety    Depression    Disturbance of skin sensation    Right arm   Headache(784.0)    Hypertension    Tingling sensation 05/27/2013   Tremor of unknown origin 05/27/2013   Uterine fibroid    Vertigo      Past Surgical History:  Procedure Laterality Date   HERNIA REPAIR     HYSTERECTOMY ABDOMINAL WITH SALPINGECTOMY Bilateral 05/07/2020   Procedure: EXPLORATORY LAPAROTOMY,TOTAL HYSTERECTOMY ABDOMINAL WITH BILATERAL  SALPINGECTOMY GREATER THAN 250GM;  Surgeon: Everitt Amber, MD;  Location: [facility];  Service: Gynecology;  Laterality: Bilateral;   MYOMECTOMY     OMENTECTOMY N/A 05/07/2020   Procedure: OMENTECTOMY, RADICAL TUMOR DEBULKING;  Surgeon: Everitt Amber, MD;  Location: [facility];  Service: Gynecology;  Laterality: N/A;    Family History  Problem Relation Age of Onset  Hypertension Mother    Drug abuse Father    Diabetes Maternal Grandmother    Diabetes Maternal Grandfather    Stroke Paternal Grandfather    Bipolar disorder Cousin     Social History   Tobacco Use   Smoking status: Never   Smokeless tobacco: Never  Vaping Use   Vaping Use: Never used  Substance Use Topics   Alcohol use: Yes    Alcohol/week: 0.0 standard drinks of alcohol    Comment: rarely   Drug use: No    ROS   Objective:   Vitals: BP (!) 138/110 (BP Location: Right Arm)   Pulse 93   Temp 99.6 F (37.6 C) (Oral)   Resp 20    LMP 04/13/2020   SpO2 96%   BP Readings from Last 3 Encounters:  04/25/22 (!) 172/117  10/25/21 140/90  10/01/21 (!) 150/92   Physical Exam Constitutional:      General: She is not in acute distress.    Appearance: Normal appearance. She is well-developed and normal weight. She is not ill-appearing, toxic-appearing or diaphoretic.  HENT:     Head: Normocephalic and atraumatic.     Right Ear: Tympanic membrane, ear canal and external ear normal. No drainage or tenderness. No middle ear effusion. There is no impacted cerumen. Tympanic membrane is not erythematous or bulging.     Left Ear: Tympanic membrane, ear canal and external ear normal. No drainage or tenderness.  No middle ear effusion. There is impacted cerumen. Tympanic membrane is not erythematous or bulging.     Nose: No congestion or rhinorrhea.     Mouth/Throat:     Mouth: Mucous membranes are moist. No oral lesions.     Pharynx: No pharyngeal swelling, oropharyngeal exudate, posterior oropharyngeal erythema or uvula swelling.     Tonsils: No tonsillar exudate or tonsillar abscesses.  Eyes:     General: No scleral icterus.       Right eye: No discharge.        Left eye: No discharge.     Extraocular Movements: Extraocular movements intact.     Right eye: Normal extraocular motion.     Left eye: Normal extraocular motion.     Conjunctiva/sclera: Conjunctivae normal.  Cardiovascular:     Rate and Rhythm: Normal rate.  Pulmonary:     Effort: Pulmonary effort is normal.  Musculoskeletal:     Cervical back: Normal range of motion and neck supple.  Lymphadenopathy:     Cervical: No cervical adenopathy.  Skin:    General: Skin is warm and dry.  Neurological:     General: No focal deficit present.     Mental Status: She is alert and oriented to person, place, and time.     Cranial Nerves: No cranial nerve deficit.     Motor: No weakness.     Coordination: Coordination normal.     Gait: Gait normal.     Deep Tendon  Reflexes: Reflexes normal.     Comments: No facial asymmetry.  Negative Romberg and pronator drift.  Psychiatric:        Mood and Affect: Mood normal.        Behavior: Behavior normal.        Thought Content: Thought content normal.        Judgment: Judgment normal.    Ear lavage performed using mixture of peroxide and water.  Pressure irrigation performed using a bottle and a thin ear tube. Left ear lavage.  No curette was used.  Assessment and Plan :   PDMP not reviewed this encounter.  1. Viral upper respiratory illness   2. Essential hypertension   3. Impacted cerumen of left ear     Will manage for viral illness such as viral URI, viral syndrome, viral rhinitis, COVID-19. Recommended supportive care. Offered scripts for symptomatic relief. Testing is pending.  Successful left cerumen impaction.  Advised that she stop using Q-tips.  Can use Debrox as needed.  Regarding her blood pressure, I will add amlodipine back to her regimen.  Provided her with information to several PCPs to establish care with and continue long-term management of her hypertension.  No signs of an acute encephalopathy.  Low suspicion for ACS given lack of cardiac symptoms.  Counseled patient on potential for adverse effects with medications prescribed/recommended today, ER and return-to-clinic precautions discussed, patient verbalized understanding.     Jaynee Eagles, [place] 07/22/22 1432

## 2022-07-23 LAB — SARS CORONAVIRUS 2 (TAT 6-24 HRS): SARS Coronavirus 2: NEGATIVE

## 2022-09-08 ENCOUNTER — Ambulatory Visit: Payer: Medicaid Other | Admitting: Family Medicine

## 2022-11-12 ENCOUNTER — Other Ambulatory Visit: Payer: Self-pay | Admitting: Medical

## 2023-01-19 ENCOUNTER — Ambulatory Visit: Payer: 59 | Admitting: Family Medicine

## 2023-01-19 ENCOUNTER — Encounter: Payer: Self-pay | Admitting: Family Medicine

## 2023-01-19 VITALS — BP 117/84 | HR 86 | Resp 18 | Ht 61.0 in | Wt 209.8 lb

## 2023-01-19 DIAGNOSIS — Z113 Encounter for screening for infections with a predominantly sexual mode of transmission: Secondary | ICD-10-CM

## 2023-01-19 DIAGNOSIS — K5904 Chronic idiopathic constipation: Secondary | ICD-10-CM

## 2023-01-19 DIAGNOSIS — Z114 Encounter for screening for human immunodeficiency virus [HIV]: Secondary | ICD-10-CM | POA: Diagnosis not present

## 2023-01-19 DIAGNOSIS — Z1231 Encounter for screening mammogram for malignant neoplasm of breast: Secondary | ICD-10-CM

## 2023-01-19 DIAGNOSIS — K219 Gastro-esophageal reflux disease without esophagitis: Secondary | ICD-10-CM

## 2023-01-19 DIAGNOSIS — Z1159 Encounter for screening for other viral diseases: Secondary | ICD-10-CM

## 2023-01-19 DIAGNOSIS — I10 Essential (primary) hypertension: Secondary | ICD-10-CM | POA: Insufficient documentation

## 2023-01-19 MED ORDER — AMLODIPINE BESYLATE 5 MG PO TABS: 5.0000 mg | ORAL_TABLET | Freq: Every day | ORAL | 0 refills | Status: DC

## 2023-01-19 MED ORDER — HYDROCHLOROTHIAZIDE 12.5 MG PO CAPS: 12.5000 mg | ORAL_CAPSULE | Freq: Every day | ORAL | 1 refills | Status: DC

## 2023-01-19 MED ORDER — LINACLOTIDE 72 MCG PO CAPS: 72.0000 ug | ORAL_CAPSULE | Freq: Every day | ORAL | 2 refills | Status: DC

## 2023-01-19 MED ORDER — VALSARTAN 160 MG PO TABS: 160.0000 mg | ORAL_TABLET | Freq: Every day | ORAL | 1 refills | Status: DC

## 2023-01-19 MED ORDER — PANTOPRAZOLE SODIUM 20 MG PO TBEC: 20.0000 mg | DELAYED_RELEASE_TABLET | Freq: Every day | ORAL | 1 refills | Status: DC

## 2023-01-19 NOTE — Assessment & Plan Note (Signed)
Provided pt with refill on protonix but will try the 20mg  first

## 2023-01-19 NOTE — Assessment & Plan Note (Signed)
Patient has a history of constipation.  She has tried multiple rounds of laxatives increasing fiber and increasing water and exercise.  She did get a sample of Linzess in the past and said it helped her.  Will go ahead and order today and see if this helps her again.

## 2023-01-19 NOTE — Progress Notes (Signed)
New patient visit   Patient: Traci Frank   DOB: [birth year]   42 y.o. Female  MRN: [id number] Visit Date: 01/19/2023  Today's healthcare provider: Charlton Amor, DO   Chief Complaint  Patient presents with   New Patient (Initial Visit)    Establish care    SUBJECTIVE    Chief Complaint  Patient presents with   New Patient (Initial Visit)    Establish care   HPI HPI     New Patient (Initial Visit)    Additional comments: Establish care      Last edited by Roselyn Reef, CMA on 01/19/2023  8:16 AM.       Earl [place] of HTN  - on amlodipine 5mg  - hydrochlorothiazide 12.5mg   - valsartan 160mg    Family history  - htn mom  Dm- aunts and uncles Breast cancer in aunts and maternal grandmother. She has had mammos in the past but is behind on screening this year, needs one   Unclear if she has a hx of asthma but does have an inhaler   Has hx of reflux but   Hx of total hysterectomy   Review of Systems  Constitutional:  Negative for activity change, fatigue and fever.  Respiratory:  Negative for cough and shortness of breath.   Cardiovascular:  Negative for chest pain.  Gastrointestinal:  Negative for abdominal pain.  Genitourinary:  Negative for difficulty urinating.       Current Meds  Medication Sig   acetaminophen (TYLENOL) 325 MG tablet Take 2 tablets (650 mg total) by mouth every 6 (six) hours as needed for moderate pain.   albuterol (VENTOLIN HFA) 108 (90 Base) MCG/ACT inhaler Inhale 2 puffs into the lungs every 4 (four) hours as needed for wheezing or shortness of breath.   carbamide peroxide (DEBROX) 6.5 % OTIC solution Place 5 drops into both ears daily as needed.   fluticasone (FLONASE) 50 MCG/ACT nasal spray Place 2 sprays into both nostrils daily.   levocetirizine (XYZAL) 5 MG tablet Take 1 tablet (5 mg total) by mouth every evening.   Multiple Vitamin (THERA) TABS Take 1 tablet by mouth daily.   pantoprazole (PROTONIX) 20 MG tablet Take 1 tablet  (20 mg total) by mouth daily.   predniSONE (DELTASONE) 10 MG tablet Take 3 tablets (30 mg total) by mouth daily with breakfast.   [DISCONTINUED] amLODipine (NORVASC) 5 MG tablet Take 1 tablet (5 mg total) by mouth daily.   [DISCONTINUED] hydrochlorothiazide (MICROZIDE) 12.5 MG capsule TAKE 1 CAPSULE BY MOUTH EVERY DAY   [DISCONTINUED] linaCLOtide (LINZESS PO) Take 1 tablet by mouth daily as needed.   [DISCONTINUED] pantoprazole (PROTONIX) 40 MG tablet Take 1 tablet (40 mg total) by mouth daily.   [DISCONTINUED] valsartan (DIOVAN) 160 MG tablet TAKE 1 TABLET BY MOUTH EVERY DAY    OBJECTIVE    BP 117/84 (BP Location: Left Arm, Patient Position: Sitting, Cuff Size: Large)   Pulse 86   Resp 18   Ht 5\' 1"  (1.549 m)   Wt 209 lb 12 oz (95.1 kg)   LMP 04/13/2020   SpO2 99%   BMI 39.63 kg/m   Physical Exam Vitals and nursing note reviewed.  Constitutional:      General: She is not in acute distress.    Appearance: Normal appearance.  HENT:     Head: Normocephalic and atraumatic.     Right Ear: External ear normal.     Left Ear: External ear normal.     Nose:  Nose normal.  Eyes:     Conjunctiva/sclera: Conjunctivae normal.  Cardiovascular:     Rate and Rhythm: Normal rate and regular rhythm.  Pulmonary:     Effort: Pulmonary effort is normal.     Breath sounds: Normal breath sounds.  Abdominal:     General: Abdomen is flat. Bowel sounds are normal.     Palpations: Abdomen is soft.     Tenderness: There is no abdominal tenderness.  Neurological:     General: No focal deficit present.     Mental Status: She is alert and oriented to person, place, and time.  Psychiatric:        Mood and Affect: Mood normal.        Behavior: Behavior normal.        Thought Content: Thought content normal.        Judgment: Judgment normal.          ASSESSMENT & PLAN    Problem List Items Addressed This Visit       Cardiovascular and Mediastinum   Primary hypertension - Primary    - bmp  ordered as well as microalbumin/cr ratio  - repeat bp meds       Relevant Medications   amLODipine (NORVASC) 5 MG tablet   hydrochlorothiazide (MICROZIDE) 12.5 MG capsule   valsartan (DIOVAN) 160 MG tablet   Other Relevant Orders   Basic Metabolic Panel (BMET)   Microalbumin / creatinine urine ratio     Digestive   GERD (gastroesophageal reflux disease)    Provided pt with refill on protonix but will try the 20mg  first      Relevant Medications   linaclotide (LINZESS) 72 MCG capsule   pantoprazole (PROTONIX) 20 MG tablet   Other Visit Diagnoses     Screening for HIV without presence of risk factors       Relevant Orders   HIV antibody (with reflex)   Encounter for hepatitis C screening test for low risk patient       Relevant Orders   Hepatitis C antibody   Encounter for screening mammogram for malignant neoplasm of breast       Relevant Orders   MM DIGITAL SCREENING BILATERAL   Screen for STD (sexually transmitted disease)       Relevant Orders   RPR   GC Probe amplification, urine       Return in about 4 weeks (around 02/16/2023).      Meds ordered this encounter  Medications   amLODipine (NORVASC) 5 MG tablet    Sig: Take 1 tablet (5 mg total) by mouth daily.    Dispense:  90 tablet    Refill:  0   linaclotide (LINZESS) 72 MCG capsule    Sig: Take 1 capsule (72 mcg total) by mouth daily before breakfast.    Dispense:  90 capsule    Refill:  2   hydrochlorothiazide (MICROZIDE) 12.5 MG capsule    Sig: Take 1 capsule (12.5 mg total) by mouth daily.    Dispense:  90 capsule    Refill:  1   valsartan (DIOVAN) 160 MG tablet    Sig: Take 1 tablet (160 mg total) by mouth daily.    Dispense:  90 tablet    Refill:  1   pantoprazole (PROTONIX) 20 MG tablet    Sig: Take 1 tablet (20 mg total) by mouth daily.    Dispense:  90 tablet    Refill:  1    Orders Placed This  Encounter  Procedures   MM DIGITAL SCREENING BILATERAL    Standing Status:   Future     Standing Expiration Date:   01/19/2024    Order Specific Question:   Is the patient pregnant?    Answer:   No    Order Specific Question:   Preferred imaging location?    Answer:   [facility]    Order Specific Question:   Reason for exam:    Answer:   screening for breast cancer    Order Specific Question:   Release to patient    Answer:   Immediate   Basic Metabolic Panel (BMET)   Microalbumin / creatinine urine ratio   HIV antibody (with reflex)   Hepatitis C antibody   RPR   GC Probe amplification, urine     Charlton Amor, DO  [facility] at [facility] [phone] (phone) [phone] (fax)  [facility]

## 2023-01-19 NOTE — Assessment & Plan Note (Signed)
-   bmp ordered as well as microalbumin/cr ratio  - repeat bp meds

## 2023-01-20 LAB — HEPATITIS C ANTIBODY: Hep C Virus Ab: NONREACTIVE

## 2023-01-20 LAB — BASIC METABOLIC PANEL
BUN/Creatinine Ratio: 18 (ref 9–23)
BUN: 16 mg/dL (ref 6–24)
CO2: 27 mmol/L (ref 20–29)
Calcium: 9.1 mg/dL (ref 8.7–10.2)
Chloride: 101 mmol/L (ref 96–106)
Creatinine, Ser: 0.88 mg/dL (ref 0.57–1.00)
Glucose: 90 mg/dL (ref 70–99)
Potassium: 3.7 mmol/L (ref 3.5–5.2)
Sodium: 138 mmol/L (ref 134–144)
eGFR: 85 mL/min/{1.73_m2} (ref >59)

## 2023-01-20 LAB — MICROALBUMIN / CREATININE URINE RATIO
Creatinine, Urine: 486.3 mg/dL
Microalb/Creat Ratio: 8 mg/g{creat} (ref 0–29)
Microalbumin, Urine: 36.7 ug/mL

## 2023-01-20 LAB — RPR: RPR Ser Ql: NONREACTIVE

## 2023-01-20 LAB — HIV ANTIBODY (ROUTINE TESTING W REFLEX): HIV Screen 4th Generation wRfx: NONREACTIVE

## 2023-02-16 ENCOUNTER — Ambulatory Visit: Payer: 59 | Admitting: Family Medicine

## 2023-02-16 VITALS — BP 129/84 | HR 71 | Ht 61.0 in | Wt 211.5 lb

## 2023-02-16 DIAGNOSIS — R5383 Other fatigue: Secondary | ICD-10-CM | POA: Diagnosis not present

## 2023-02-16 DIAGNOSIS — R0683 Snoring: Secondary | ICD-10-CM | POA: Insufficient documentation

## 2023-02-16 DIAGNOSIS — I10 Essential (primary) hypertension: Secondary | ICD-10-CM

## 2023-02-16 DIAGNOSIS — Z1231 Encounter for screening mammogram for malignant neoplasm of breast: Secondary | ICD-10-CM

## 2023-02-16 DIAGNOSIS — K219 Gastro-esophageal reflux disease without esophagitis: Secondary | ICD-10-CM

## 2023-02-16 DIAGNOSIS — J302 Other seasonal allergic rhinitis: Secondary | ICD-10-CM | POA: Diagnosis not present

## 2023-02-16 DIAGNOSIS — J3489 Other specified disorders of nose and nasal sinuses: Secondary | ICD-10-CM | POA: Insufficient documentation

## 2023-02-16 MED ORDER — AMLODIPINE BESYLATE 10 MG PO TABS: 10.0000 mg | ORAL_TABLET | Freq: Every day | ORAL | 2 refills | Status: DC

## 2023-02-16 MED ORDER — PANTOPRAZOLE SODIUM 20 MG PO TBEC: 20.0000 mg | DELAYED_RELEASE_TABLET | Freq: Every day | ORAL | 1 refills | Status: DC

## 2023-02-16 MED ORDER — VALSARTAN 160 MG PO TABS: 160.0000 mg | ORAL_TABLET | Freq: Every day | ORAL | 1 refills | Status: DC

## 2023-02-16 MED ORDER — HYDROCHLOROTHIAZIDE 12.5 MG PO CAPS: 12.5000 mg | ORAL_CAPSULE | Freq: Every day | ORAL | 1 refills | Status: DC

## 2023-02-16 MED ORDER — LEVOCETIRIZINE DIHYDROCHLORIDE 5 MG PO TABS: 5.0000 mg | ORAL_TABLET | Freq: Every evening | ORAL | 0 refills | Status: DC

## 2023-02-16 NOTE — Assessment & Plan Note (Signed)
Pt admits to extreme fatigue. Ordering blood work as well as sleep study to see if these are the causes of fatigue

## 2023-02-16 NOTE — Assessment & Plan Note (Signed)
-   pt has had worsening allergies for the past few months. Being diligent with xyzal and flonase-counseled on cross treatment - referral to allergy to help with control- says she is now getting allergy symptoms to perfume, nail polish

## 2023-02-16 NOTE — Assessment & Plan Note (Signed)
-   doing well, refill meds

## 2023-02-16 NOTE — Assessment & Plan Note (Signed)
Pt admits to snoring as well as daytime fatigue, will go ahead and ref to sleep medicine as I have a strong suspicion she has sleep apnea and if that is the case she will likely be eligible for zepbound

## 2023-02-16 NOTE — Assessment & Plan Note (Signed)
-   blood pressure well controlled will refill meds - curious if she has resistant htn secondary to OSA, will order OSA testing

## 2023-02-16 NOTE — Progress Notes (Signed)
Established patient visit   Patient: Traci Frank   DOB: March 28, 1981   42 y.o. Female  MRN: [id number] Visit Date: 02/16/2023  Today's healthcare provider: Charlton Amor, DO   Chief Complaint  Patient presents with   Medical Management of Chronic Issues    HTN, Gerd    SUBJECTIVE    Chief Complaint  Patient presents with   Medical Management of Chronic Issues    HTN, Genella Rife   HPI HPI     Medical Management of Chronic Issues    Additional comments: HTN, Genella Rife      Last edited by Roselyn Reef, CMA on 02/16/2023  8:50 AM.      Pt presents for follow up on HTN - on amlodipine 5mg   - Hydrochlorothiazide 12.5mg   - valsartan 160mg   - she ran out of her medicine and was taking a friend's blood pressure pill   Since covid she had had allergy type symptoms.  - diagnosed two years ago she feels like her sinuses have been "off"  - if she sneezes her nose immediately runs  - getting nails done cause her sinus issues with even press on - sneezing since putting on perfume  - she is on xyzal and doing a nasal spray   Review of Systems  Constitutional:  Negative for activity change, fatigue and fever.  Respiratory:  Negative for cough and shortness of breath.   Cardiovascular:  Negative for chest pain.  Gastrointestinal:  Negative for abdominal pain.  Genitourinary:  Negative for difficulty urinating.       Current Meds  Medication Sig   acetaminophen (TYLENOL) 325 MG tablet Take 2 tablets (650 mg total) by mouth every 6 (six) hours as needed for moderate pain.   albuterol (VENTOLIN HFA) 108 (90 Base) MCG/ACT inhaler Inhale 2 puffs into the lungs every 4 (four) hours as needed for wheezing or shortness of breath.   amLODipine (NORVASC) 10 MG tablet Take 1 tablet (10 mg total) by mouth daily.   carbamide peroxide (DEBROX) 6.5 % OTIC solution Place 5 drops into both ears daily as needed.   fluticasone (FLONASE) 50 MCG/ACT nasal spray Place 2 sprays into both nostrils  daily.   ipratropium (ATROVENT) 0.03 % nasal spray Place 2 sprays into both nostrils 2 (two) times daily.   linaclotide (LINZESS) 72 MCG capsule Take 1 capsule (72 mcg total) by mouth daily before breakfast.   Multiple Vitamin (THERA) TABS Take 1 tablet by mouth daily.   [DISCONTINUED] hydrochlorothiazide (MICROZIDE) 12.5 MG capsule Take 1 capsule (12.5 mg total) by mouth daily.   [DISCONTINUED] levocetirizine (XYZAL) 5 MG tablet Take 1 tablet (5 mg total) by mouth every evening.   [DISCONTINUED] pantoprazole (PROTONIX) 20 MG tablet Take 1 tablet (20 mg total) by mouth daily.   [DISCONTINUED] valsartan (DIOVAN) 160 MG tablet Take 1 tablet (160 mg total) by mouth daily.    OBJECTIVE    BP 129/84 (BP Location: Right Arm, Patient Position: Sitting, Cuff Size: Large)   Pulse 71   Ht 5\' 1"  (1.549 m)   Wt 211 lb 8 oz (95.9 kg)   LMP 04/13/2020   SpO2 100%   BMI 39.96 kg/m   Physical Exam Vitals and nursing note reviewed.  Constitutional:      General: She is not in acute distress.    Appearance: Normal appearance.  HENT:     Head: Normocephalic and atraumatic.     Right Ear: External ear normal.     Left  Ear: External ear normal.     Nose: Nose normal.  Eyes:     Conjunctiva/sclera: Conjunctivae normal.  Cardiovascular:     Rate and Rhythm: Normal rate and regular rhythm.  Pulmonary:     Effort: Pulmonary effort is normal.     Breath sounds: Normal breath sounds.  Neurological:     General: No focal deficit present.     Mental Status: She is alert and oriented to person, place, and time.  Psychiatric:        Mood and Affect: Mood normal.        Behavior: Behavior normal.        Thought Content: Thought content normal.        Judgment: Judgment normal.        ASSESSMENT & PLAN    Problem List Items Addressed This Visit       Cardiovascular and Mediastinum   Primary hypertension    - blood pressure well controlled will refill meds - curious if she has resistant htn  secondary to OSA, will order OSA testing      Relevant Medications   amLODipine (NORVASC) 10 MG tablet   valsartan (DIOVAN) 160 MG tablet   hydrochlorothiazide (MICROZIDE) 12.5 MG capsule     Digestive   GERD (gastroesophageal reflux disease)    - doing well, refill meds      Relevant Medications   pantoprazole (PROTONIX) 20 MG tablet     Other   Seasonal allergies    - pt has had worsening allergies for the past few months. Being diligent with xyzal and flonase-counseled on cross treatment - referral to allergy to help with control- says she is now getting allergy symptoms to perfume, nail polish      Relevant Orders   Ambulatory referral to Allergy   Rhinorrhea   Relevant Orders   Ambulatory referral to ENT   Snoring - Primary    Pt admits to snoring as well as daytime fatigue, will go ahead and ref to sleep medicine as I have a strong suspicion she has sleep apnea and if that is the case she will likely be eligible for zepbound      Relevant Orders   Ambulatory referral to Sleep Studies   Other fatigue    Pt admits to extreme fatigue. Ordering blood work as well as sleep study to see if these are the causes of fatigue       Relevant Orders   Vitamin D (25 hydroxy)   Magnesium   Vitamin B12   Other Visit Diagnoses     Encounter for screening mammogram for malignant neoplasm of breast       Relevant Orders   MM 3D SCREENING MAMMOGRAM BILATERAL BREAST       No follow-ups on file.      Meds ordered this encounter  Medications   amLODipine (NORVASC) 10 MG tablet    Sig: Take 1 tablet (10 mg total) by mouth daily.    Dispense:  90 tablet    Refill:  2   valsartan (DIOVAN) 160 MG tablet    Sig: Take 1 tablet (160 mg total) by mouth daily.    Dispense:  90 tablet    Refill:  1   levocetirizine (XYZAL) 5 MG tablet    Sig: Take 1 tablet (5 mg total) by mouth every evening.    Dispense:  90 tablet    Refill:  0   hydrochlorothiazide (MICROZIDE) 12.5 MG  capsule  Sig: Take 1 capsule (12.5 mg total) by mouth daily.    Dispense:  90 capsule    Refill:  1   pantoprazole (PROTONIX) 20 MG tablet    Sig: Take 1 tablet (20 mg total) by mouth daily.    Dispense:  90 tablet    Refill:  1    Orders Placed This Encounter  Procedures   MM 3D SCREENING MAMMOGRAM BILATERAL BREAST    Standing Status:   Future    Standing Expiration Date:   02/16/2024    Order Specific Question:   Reason for Exam (SYMPTOM  OR DIAGNOSIS REQUIRED)    Answer:   screening for breast cancer    Order Specific Question:   Is the patient pregnant?    Answer:   No    Order Specific Question:   Preferred imaging location?    Answer:   [facility]   Vitamin D (25 hydroxy)   Magnesium   Vitamin B12   Ambulatory referral to ENT    Referral Priority:   Routine    Referral Type:   Consultation    Referral Reason:   Specialty Services Required    Requested Specialty:   Otolaryngology    Number of Visits Requested:   1   Ambulatory referral to Allergy    Referral Priority:   Routine    Referral Type:   Allergy Testing    Referral Reason:   Specialty Services Required    Requested Specialty:   Allergy    Number of Visits Requested:   1   Ambulatory referral to Sleep Studies    Referral Priority:   Routine    Referral Type:   Consultation    Referral Reason:   Specialty Services Required    Requested Specialty:   Sleep Medicine    Number of Visits Requested:   1     Charlton Amor, DO  [facility] at [facility] [phone] (phone) [phone] (fax)  [facility]

## 2023-02-17 ENCOUNTER — Other Ambulatory Visit: Payer: Self-pay | Admitting: Family Medicine

## 2023-02-17 ENCOUNTER — Encounter: Payer: Self-pay | Admitting: Family Medicine

## 2023-02-17 DIAGNOSIS — E559 Vitamin D deficiency, unspecified: Secondary | ICD-10-CM

## 2023-02-17 LAB — MAGNESIUM: Magnesium: 2.2 mg/dL (ref 1.6–2.3)

## 2023-02-17 LAB — VITAMIN D 25 HYDROXY (VIT D DEFICIENCY, FRACTURES): Vit D, 25-Hydroxy: 10.5 ng/mL — ABNORMAL LOW (ref 30.0–100.0)

## 2023-02-17 LAB — VITAMIN B12: Vitamin B-12: 698 pg/mL (ref 232–1245)

## 2023-02-17 MED ORDER — CHOLECALCIFEROL 1.25 MG (50000 UT) PO TABS: 1.0000 | ORAL_TABLET | ORAL | 0 refills | Status: DC

## 2023-02-17 NOTE — Telephone Encounter (Signed)
Please advise regarding the upcoming appointment for family member. Thanks in advance.

## 2023-02-21 ENCOUNTER — Telehealth: Payer: Self-pay | Admitting: Family Medicine

## 2023-02-21 NOTE — Telephone Encounter (Signed)
Received fax from [facility] in regards to this pt. They have stated that Dr.Reddy is not at their location. Provider is at [facility]. Request was sent to [facility] and needs to go to [facility]. Fax has been placed in your folder.

## 2023-02-27 ENCOUNTER — Ambulatory Visit (INDEPENDENT_AMBULATORY_CARE_PROVIDER_SITE_OTHER): Payer: 59 | Admitting: Family Medicine

## 2023-02-27 VITALS — BP 113/71 | HR 77 | Ht 62.0 in | Wt 212.0 lb

## 2023-02-27 DIAGNOSIS — R062 Wheezing: Secondary | ICD-10-CM

## 2023-02-27 MED ORDER — ALBUTEROL SULFATE HFA 108 (90 BASE) MCG/ACT IN AERS: 2.0000 | INHALATION_SPRAY | RESPIRATORY_TRACT | 2 refills | Status: DC | PRN

## 2023-02-27 MED ORDER — ALBUTEROL SULFATE (2.5 MG/3ML) 0.083% IN NEBU
2.5000 mg | INHALATION_SOLUTION | Freq: Once | RESPIRATORY_TRACT | Status: AC
  Administered 2023-02-27: 2.5 mg via RESPIRATORY_TRACT

## 2023-02-27 NOTE — Progress Notes (Signed)
Established patient visit   Patient: Traci Frank   DOB: 06/30/1980   42 y.o. Female  MRN: [id number] Visit Date: 02/27/2023  Today's healthcare provider: Charlton Amor, DO   Chief Complaint  Patient presents with   Wheezing    SUBJECTIVE    Chief Complaint  Patient presents with   Wheezing   Wheezing  Pertinent negatives include no abdominal pain, chest pain, coughing, fever or shortness of breath.    Pt presents for visit post spirometry testing.   Review of Systems  Constitutional:  Negative for activity change, fatigue and fever.  Respiratory:  Positive for wheezing. Negative for cough and shortness of breath.   Cardiovascular:  Negative for chest pain.  Gastrointestinal:  Negative for abdominal pain.  Genitourinary:  Negative for difficulty urinating.       Current Meds  Medication Sig   acetaminophen (TYLENOL) 325 MG tablet Take 2 tablets (650 mg total) by mouth every 6 (six) hours as needed for moderate pain.   amLODipine (NORVASC) 10 MG tablet Take 1 tablet (10 mg total) by mouth daily.   carbamide peroxide (DEBROX) 6.5 % OTIC solution Place 5 drops into both ears daily as needed.   Cholecalciferol 1.25 MG (50000 UT) TABS Take 1 tablet by mouth once a week.   fluticasone (FLONASE) 50 MCG/ACT nasal spray Place 2 sprays into both nostrils daily.   hydrochlorothiazide (MICROZIDE) 12.5 MG capsule Take 1 capsule (12.5 mg total) by mouth daily.   ipratropium (ATROVENT) 0.03 % nasal spray Place 2 sprays into both nostrils 2 (two) times daily.   levocetirizine (XYZAL) 5 MG tablet Take 1 tablet (5 mg total) by mouth every evening.   linaclotide (LINZESS) 72 MCG capsule Take 1 capsule (72 mcg total) by mouth daily before breakfast.   Multiple Vitamin (THERA) TABS Take 1 tablet by mouth daily.   pantoprazole (PROTONIX) 20 MG tablet Take 1 tablet (20 mg total) by mouth daily.   valsartan (DIOVAN) 160 MG tablet Take 1 tablet (160 mg total) by mouth daily.    [DISCONTINUED] albuterol (VENTOLIN HFA) 108 (90 Base) MCG/ACT inhaler Inhale 2 puffs into the lungs every 4 (four) hours as needed for wheezing or shortness of breath.    OBJECTIVE    BP 113/71   Pulse 77   Ht 5\' 2"  (1.575 m)   Wt 212 lb (96.2 kg)   LMP 04/13/2020   SpO2 98%   BMI 38.78 kg/m   Physical Exam Vitals and nursing note reviewed.  Constitutional:      General: She is not in acute distress.    Appearance: Normal appearance.  HENT:     Head: Normocephalic and atraumatic.     Right Ear: External ear normal.     Left Ear: External ear normal.     Nose: Nose normal.  Eyes:     Conjunctiva/sclera: Conjunctivae normal.  Cardiovascular:     Rate and Rhythm: Normal rate and regular rhythm.  Pulmonary:     Effort: Pulmonary effort is normal.     Breath sounds: Normal breath sounds.  Neurological:     General: No focal deficit present.     Mental Status: She is alert and oriented to person, place, and time.  Psychiatric:        Mood and Affect: Mood normal.        Behavior: Behavior normal.        Thought Content: Thought content normal.        Judgment:  Judgment normal.        ASSESSMENT & PLAN    Problem List Items Addressed This Visit       Other   Wheezing - Primary    - spirometry testing is all normal. No signs of asthma or copd likely pt gets some airway disease with allergies and or illnesses.      Relevant Medications   albuterol (VENTOLIN HFA) 108 (90 Base) MCG/ACT inhaler   Other Relevant Orders   PR BRNCDILAT RSPSE SPMTRY PRE&POST-BRNCDILAT ADMN    No follow-ups on file.      Meds ordered this encounter  Medications   albuterol (PROVENTIL) (2.5 MG/3ML) 0.083% nebulizer solution 2.5 mg   albuterol (VENTOLIN HFA) 108 (90 Base) MCG/ACT inhaler    Sig: Inhale 2 puffs into the lungs every 4 (four) hours as needed for wheezing or shortness of breath.    Dispense:  8 g    Refill:  2    Orders Placed This Encounter  Procedures   PR  BRNCDILAT RSPSE SPMTRY PRE&POST-BRNCDILAT ADMN     Charlton Amor, DO  [facility] at [facility] [phone] (phone) [phone] (fax)  [facility]

## 2023-02-27 NOTE — Assessment & Plan Note (Signed)
-   spirometry testing is all normal. No signs of asthma or copd likely pt gets some airway disease with allergies and or illnesses.

## 2023-03-02 NOTE — Telephone Encounter (Signed)
Referral, clinical notes, demographics, and copies of insurance cards have been faxed to [facility] at [phone]. Office will contact patient to schedule referral appointment.

## 2023-03-14 ENCOUNTER — Ambulatory Visit
Admission: RE | Admit: 2023-03-14 | Discharge: 2023-03-14 | Disposition: A | Discharge status: Home / Self Care | Payer: 59 | Source: Ambulatory Visit | Attending: Family Medicine

## 2023-03-14 DIAGNOSIS — Z1231 Encounter for screening mammogram for malignant neoplasm of breast: Secondary | ICD-10-CM

## 2023-03-15 ENCOUNTER — Other Ambulatory Visit: Payer: Self-pay | Admitting: Family Medicine

## 2023-03-15 DIAGNOSIS — E559 Vitamin D deficiency, unspecified: Secondary | ICD-10-CM

## 2023-04-04 ENCOUNTER — Encounter: Payer: Self-pay | Admitting: Internal Medicine

## 2023-04-04 ENCOUNTER — Other Ambulatory Visit: Payer: Self-pay | Admitting: Internal Medicine

## 2023-04-04 ENCOUNTER — Ambulatory Visit: Payer: 59 | Admitting: Internal Medicine

## 2023-04-04 VITALS — BP 118/70 | HR 81 | Temp 98.2°F | Resp 18 | Ht 62.5 in | Wt 213.7 lb

## 2023-04-04 DIAGNOSIS — H1045 Other chronic allergic conjunctivitis: Secondary | ICD-10-CM | POA: Diagnosis not present

## 2023-04-04 DIAGNOSIS — J4530 Mild persistent asthma, uncomplicated: Secondary | ICD-10-CM

## 2023-04-04 DIAGNOSIS — J3089 Other allergic rhinitis: Secondary | ICD-10-CM | POA: Diagnosis not present

## 2023-04-04 MED ORDER — AZELASTINE HCL 0.1 % NA SOLN: 2.0000 | Freq: Two times a day (BID) | NASAL | 12 refills | Status: DC

## 2023-04-04 MED ORDER — MONTELUKAST SODIUM 10 MG PO TABS: 10.0000 mg | ORAL_TABLET | Freq: Every day | ORAL | 1 refills | Status: DC

## 2023-04-04 MED ORDER — FLUTICASONE PROPIONATE 50 MCG/ACT NA SUSP: 1.0000 | Freq: Every day | NASAL | 2 refills | Status: AC

## 2023-04-04 MED ORDER — FLUTICASONE PROPIONATE HFA 44 MCG/ACT IN AERO: 2.0000 | INHALATION_SPRAY | Freq: Two times a day (BID) | RESPIRATORY_TRACT | 12 refills | Status: DC

## 2023-04-04 NOTE — Patient Instructions (Signed)
Chronic Rhinitis  Increased sneezing, runny nose, and sinus issues since COVID-19 infection two years ago. Symptoms exacerbated by exposure to air conditioning, strong odors, and dog fur. No relief with Benadryl, Flonase, and Tylenol. Also, seasonal hay fever in the fall. -Schedule for allergy testing (1-68)  - Continue avoidance of irritant triggers  - Start singulair 10mg  at night (monitor for nightmares)  - Start Flonase 1 spray per nostril twice daily.  Aim upward and outward - Start Astelin 2 sprays into both nostrils twice daily - Continue Xyzal 5 mg daily  Asthma: mild persistent  New onset post-COVID-19 infection. Symptoms of shortness of breath and wheezing, more than twice a week. Albuterol provides relief.  -Start Flovent 2 puffs twice daily with spacer  - During respiratory illness or asthma flares: Increase Flovent 4 puffs  and continue for 2 weeks or until symptoms resolve. - Rescue Inhaler: Albuterol (Proair/Ventolin) 2 puffs . Use  every 4-6 hours as needed for chest tightness, wheezing, or coughing.  Can also use 15 minutes prior to exercise if you have symptoms with activity. - Asthma is not controlled if:  - Symptoms are occurring >2 times a week OR  - >2 times a month nighttime awakenings  - You are requiring systemic steroids (prednisone/steroid injections) more than once per year  - Your require hospitalization for your asthma.  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 Symptoms resolved with avoidance. -Continue avoidance of acrylates, particularly nail glue.  Gastroesophageal Reflux Disease (GERD) Controlled with medication, occasional breakthrough symptoms with certain foods. -Continue pantoprazole 20 mg daily -Advise on diet control to prevent reflux-induced respiratory symptoms.  Follow-up: in 1 week for allergy testing  (1-68) hold xyzal/levocetirizine for 3 days prior

## 2023-04-04 NOTE — Progress Notes (Signed)
NEW PATIENT Date of Service/Encounter:  04/04/23 Referring provider: Charlton Amor, DO Primary care provider: Charlton Amor, DO  Subjective:  Traci Frank is a 42 y.o. female with a PMHx of hypertension, GERD presenting today for evaluation of rhinitis, asthma  History obtained from: chart review and patient.   Discussed the use of AI scribe software for clinical note transcription with the patient, who gave verbal consent to proceed.  History of Present Illness   The patient, with a history of asthma, presents with increased upper respiratory symptoms and sensitivity to certain substances since contracting COVID-19 two years ago. They report increased sneezing, runny nose, and sinus issues, which are particularly triggered by exposure to air conditioning and strong odors. The patient also notes a new sensitivity to glue, specifically from nail salons or super glue, which causes itching in the throat, ears, and eyes, and nasal congestion,  coughing and chest tightness mimicking a cold for about a week.  The patient has attempted to manage these symptoms with over-the-counter medications such as Benadryl, Flonase, and Tylenol, but reports no relief. They also have a history of  seasonal allergic rhinitis which typically worsens in the fall. Recently, they have noticed an increased sensitivity to dog fur, which triggers sneezing.  In addition to these symptoms, the patient reports a history of bronchitis and subsequent lingering symptoms, including shortness of breath and wheezing, particularly when walking or lying down. They use an albuterol inhaler to manage these symptoms, but admit to not using it as often as they should. The patient also reports phlegm production upon waking up.  Spirometry performed by PCP in September 2024 was normal with no bronchodilator response, so controller therapy was not initiated.   The patient has a history of reflux and heartburn, which is managed with  medication. However, they do experience breakthrough symptoms depending on their diet.   The patient's symptoms have led to changes in their lifestyle, including avoiding nail salons due to the adverse reactions they experience from the glue. They have also had to adjust their living environment due to a newfound sensitivity to dog fur. Despite these changes, the patient's symptoms persist, impacting their daily life.        Chart Review:  CXR 06/16/21:Lung volumes are normal. No consolidative airspace disease. No pleural effusions. No pneumothorax. No pulmonary nodule or mass noted. Pulmonary vasculature and the cardiomediastinal silhouette are within normal limits. AEC 07/18/18: 100  Other allergy screening: Asthma: yes Rhino conjunctivitis: yes Food allergy: no Medication allergy: no Hymenoptera allergy: no Urticaria: no Eczema:no History of recurrent infections suggestive of immunodeficency: no Vaccinations are up to date.   Past Medical History: Past Medical History:  Diagnosis Date   Abnormal involuntary movements(781.0)    Right hand   Allergy    Anemia    Anxiety    Asthma    Depression    Disturbance of skin sensation    Right arm   GERD (gastroesophageal reflux disease)    Headache(784.0)    Hypertension    Tingling sensation 05/27/2013   Tremor of unknown origin 05/27/2013   Uterine fibroid    Vertigo    Medication List:  Current Outpatient Medications  Medication Sig Dispense Refill   albuterol (VENTOLIN HFA) 108 (90 Base) MCG/ACT inhaler Inhale 2 puffs into the lungs every 4 (four) hours as needed for wheezing or shortness of breath. 8 g 2   amLODipine (NORVASC) 10 MG tablet Take 1 tablet (10 mg total) by mouth  daily. 90 tablet 2   azelastine (ASTELIN) 0.1 % nasal spray Place 2 sprays into both nostrils 2 (two) times daily. Use in each nostril as directed 30 mL 12   fluticasone (FLONASE) 50 MCG/ACT nasal spray Place 1 spray into both nostrils daily. 16 g  2   hydrochlorothiazide (MICROZIDE) 12.5 MG capsule Take 1 capsule (12.5 mg total) by mouth daily. 90 capsule 1   ipratropium (ATROVENT) 0.03 % nasal spray Place 2 sprays into both nostrils 2 (two) times daily. 30 mL 0   levocetirizine (XYZAL) 5 MG tablet Take 1 tablet (5 mg total) by mouth every evening. 90 tablet 0   linaclotide (LINZESS) 72 MCG capsule Take 1 capsule (72 mcg total) by mouth daily before breakfast. 90 capsule 2   montelukast (SINGULAIR) 10 MG tablet Take 1 tablet (10 mg total) by mouth at bedtime. 90 tablet 1   Multiple Vitamin (THERA) TABS Take 1 tablet by mouth daily.     pantoprazole (PROTONIX) 20 MG tablet Take 1 tablet (20 mg total) by mouth daily. 90 tablet 1   valsartan (DIOVAN) 160 MG tablet Take 1 tablet (160 mg total) by mouth daily. 90 tablet 1   Vitamin D, Ergocalciferol, (DRISDOL) 1.25 MG (50000 UNIT) CAPS capsule TAKE 1 TABLET BY MOUTH ONE TIME PER WEEK 12 capsule 1   fluticasone (FLOVENT HFA) 44 MCG/ACT inhaler INHALE 2 PUFFS INTO THE LUNGS TWICE A DAY 10.6 each 12   No current facility-administered medications for this visit.   Known Allergies:  Allergies  Allergen Reactions   Latex Itching   Past Surgical History: Past Surgical History:  Procedure Laterality Date   HERNIA REPAIR     HYSTERECTOMY ABDOMINAL WITH SALPINGECTOMY Bilateral 05/07/2020   Procedure: EXPLORATORY LAPAROTOMY,TOTAL HYSTERECTOMY ABDOMINAL WITH BILATERAL  SALPINGECTOMY GREATER THAN 250GM;  Surgeon: Adolphus Birchwood, MD;  Location: [facility];  Service: Gynecology;  Laterality: Bilateral;   MYOMECTOMY     OMENTECTOMY N/A 05/07/2020   Procedure: OMENTECTOMY, RADICAL TUMOR DEBULKING;  Surgeon: Adolphus Birchwood, MD;  Location: [facility];  Service: Gynecology;  Laterality: N/A;   TOTAL ABDOMINAL HYSTERECTOMY Bilateral 05/07/2020   Family History: Family History  Problem Relation Age of Onset   Hypertension Mother    Arthritis Mother    Varicose Veins Mother    Drug abuse Father    Breast cancer  Maternal Aunt    Asthma Maternal Aunt    Cancer Maternal Aunt    COPD Maternal Aunt    Breast cancer Paternal Aunt    Alcohol abuse Paternal Aunt    Cancer Paternal Aunt    Diabetes Maternal Grandmother    Asthma Maternal Grandmother    Obesity Maternal Grandmother    Diabetes Maternal Grandfather    COPD Paternal Grandmother    Obesity Paternal Grandmother    Stroke Paternal Grandfather    Bipolar disorder Cousin    Social History: Traci Frank lives townhome is 42 years old.  No water damage.  Linoleum in the family room carpet in the bedroom.  Electric heating, central cooling.  No pets.  No roaches in the house and bed is to get a off the floor.  No dust mite precautions no tobacco exposure Works as a Child psychotherapist.  Not exposed to fumes, chemicals or dust.  No HEPA filter in the home and home [place] not [place] an [place].   ROS:  All other systems negative except as noted per HPI.  Objective:  Blood pressure 118/70, pulse 81, temperature 98.2 F (  36.8 C), temperature source Temporal, resp. rate 18, height 5' 2.5" (1.588 m), weight 213 lb 11.2 oz (96.9 kg), last menstrual period 04/13/2020, SpO2 100%. Body mass index is 38.46 kg/m. Physical Exam:  General Appearance:  Alert, cooperative, no distress, appears stated age  Head:  Normocephalic, without obvious abnormality, atraumatic  Eyes:  Conjunctiva clear, EOM's intact  Ears EACs normal bilaterally  Nose: Nares normal,  erythematous nasal mucosa, hypertrophic turbinates, no visible anterior polyps, and septum midline  Throat: Lips, tongue normal; teeth and gums normal, + cobblestoning and mildly erythematous posterior oropharynx  Neck: Supple, symmetrical  Lungs:   clear to auscultation bilaterally, Respirations unlabored, no coughing  Heart:  regular rate and rhythm and no murmur, Appears well perfused  Extremities: No edema  Skin: Skin color, texture, turgor normal and no rashes or lesions on visualized portions  of skin  Neurologic: No gross deficits   Diagnostics: None   Labs:  Lab Orders  No laboratory test(s) ordered today     Assessment and Plan  Assessment and Plan    Chronic Rhinitis  Increased sneezing, runny nose, and sinus issues since COVID-19 infection two years ago. Symptoms exacerbated by exposure to air conditioning, strong odors, and dog fur. No relief with Benadryl, Flonase, and Tylenol. Also, seasonal hay fever in the fall. -Schedule for allergy testing (1-68)  - Continue avoidance of irritant triggers  - Start singulair 10mg  at night (monitor for nightmares)  - Start Flonase 1 spray per nostril twice daily.  Aim upward and outward - Start Astelin 2 sprays into both nostrils twice daily - Continue Xyzal 5 mg daily  Asthma: mild persistent  New onset post-COVID-19 infection. Symptoms of shortness of breath and wheezing, more than twice a week. Albuterol provides relief.  -Start Flovent 2 puffs twice daily with spacer  - During respiratory illness or asthma flares: Increase Flovent 4 puffs  and continue for 2 weeks or until symptoms resolve. - Rescue Inhaler: Albuterol (Proair/Ventolin) 2 puffs . Use  every 4-6 hours as needed for chest tightness, wheezing, or coughing.  Can also use 15 minutes prior to exercise if you have symptoms with activity. - Asthma is not controlled if:  - Symptoms are occurring >2 times a week OR  - >2 times a month nighttime awakenings  - You are requiring systemic steroids (prednisone/steroid injections) more than once per year  - Your require hospitalization for your asthma.  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 Symptoms resolved with avoidance. -Continue avoidance of acrylates, particularly nail glue.  Gastroesophageal Reflux Disease (GERD) Controlled with medication, occasional breakthrough symptoms  with certain foods. -Continue pantoprazole 20 mg daily -Advise on diet control to prevent reflux-induced respiratory symptoms.  Follow-up: in 1 week for allergy testing (1-68) hold xyzal/levocetirizine for 3 days prior   This note in its entirety was forwarded to the Provider who requested this consultation.  Other: reviewed spirometry technique and reviewed inhaler technique  Thank you for your kind referral. I appreciate the opportunity to take part in Traci Frank's care. Please do not hesitate to contact me with questions.  Sincerely,   Thank you so much for letting me partake in your care today.  Don't hesitate to reach out if you have any additional concerns!  Ferol Luz, MD  [facility] [place], [place]

## 2023-04-05 ENCOUNTER — Other Ambulatory Visit: Payer: Self-pay | Admitting: Internal Medicine

## 2023-04-12 ENCOUNTER — Ambulatory Visit: Payer: 59 | Admitting: Internal Medicine

## 2023-04-12 DIAGNOSIS — J302 Other seasonal allergic rhinitis: Secondary | ICD-10-CM

## 2023-04-12 DIAGNOSIS — J3089 Other allergic rhinitis: Secondary | ICD-10-CM | POA: Diagnosis not present

## 2023-04-12 DIAGNOSIS — H1045 Other chronic allergic conjunctivitis: Secondary | ICD-10-CM

## 2023-04-12 NOTE — Progress Notes (Signed)
  Date of Service/Encounter:  04/12/23  Allergy testing appointment   Initial visit on 04/04/23, seen for asthma .  Please see that note for additional details.  Today reports for allergy diagnostic testing:    DIAGNOSTICS:  Skin Testing: Environmental allergy panel and select foods. 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  Airborne Adult Perc - 04/12/23 0929     Time Antigen Placed --    Allergen Manufacturer --    Location --    Number of Test --             13 Food Perc - 04/12/23 0846       Test Information   Time Antigen Placed 0846    Allergen Manufacturer Waynette Buttery    Location Back    Number of allergen test 13      Food   1. Peanut Negative    2. Soybean Negative    3. Wheat Negative    4. Sesame Negative    5. Milk, Cow Negative    6. Casein Negative    7. Egg White, Chicken Negative    8. Shellfish Mix Negative    9. Fish Mix Negative    10. Cashew Negative    11. Walnut Food Negative    12. Almond Negative    13. Hazelnut Negative             Intradermal - 04/12/23 0930     Time Antigen Placed 0930    Allergen Manufacturer Waynette Buttery    Location Back    Number of Test 10    Control Negative    [place] 2+    [place] Negative    Ragweed Mix Negative    Mold 3 Negative    Mold 4 Negative    Mite Mix Negative    Cat Negative    Dog Negative    Cockroach Negative             Allergy testing results were read and interpreted by myself, documented by clinical staff.  Patient provided with copy of allergy testing along with avoidance measures when indicated.   Ferol Luz, MD  [facility]

## 2023-04-12 NOTE — Patient Instructions (Addendum)
Given concern for adverse medication response, but unknown which new meds recommend:  Restart all medications one by one, give 3 days between reintroduction of meds.  Give [place] a call if you get recurrence of symptoms   Chronic Rhinitis  Increased sneezing, runny nose, and sinus issues since COVID-19 infection two years ago. Symptoms exacerbated by exposure to air conditioning, strong odors, and dog fur. No relief with Benadryl, Flonase, and Tylenol. Also, seasonal hay fever in the fall. - Allergy test: positive to grass, weed, tree, mold, tobacoo leaf  - Continue avoidance of irritant triggers  - restart  singulair 10mg  at night (monitor for nightmares)  - Restart Flonase 1 spray per nostril twice daily.  Aim upward and outward - Restart Astelin 2 sprays into both nostrils twice daily - Continue Xyzal 5 mg daily  Asthma: mild persistent  New onset post-COVID-19 infection. Symptoms of shortness of breath and wheezing, more than twice a week. Albuterol provides relief.  -Restart Flovent 2 puffs twice daily with spacer  - During respiratory illness or asthma flares: Increase Flovent 4 puffs  and continue for 2 weeks or until symptoms resolve. - Rescue Inhaler: Albuterol (Proair/Ventolin) 2 puffs . Use  every 4-6 hours as needed for chest tightness, wheezing, or coughing.  Can also use 15 minutes prior to exercise if you have symptoms with activity. - Asthma is not controlled if:  - Symptoms are occurring >2 times a week OR  - >2 times a month nighttime awakenings  - You are requiring systemic steroids (prednisone/steroid injections) more than once per year  - Your require hospitalization for your asthma.  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 Symptoms resolved with avoidance. -Continue avoidance of acrylates, particularly nail glue.  Gastroesophageal  Reflux Disease (GERD) Controlled with medication, occasional breakthrough symptoms with certain foods. -Continue pantoprazole 20 mg daily -Advise on diet control to prevent reflux-induced respiratory symptoms.  Follow-up: 3 months     Reducing Pollen Exposure  The American Academy of Allergy, Asthma and Immunology suggests the following steps to reduce your exposure to pollen during allergy seasons.    Do not hang sheets or clothing out to dry; pollen may collect on these items. Do not mow lawns or spend time around freshly cut grass; mowing stirs up pollen. Keep windows closed at night.  Keep car windows closed while driving. Minimize morning activities outdoors, a time when pollen counts are usually at their highest. Stay indoors as much as possible when pollen counts or humidity is high and on windy days when pollen tends to remain in the air longer. Use air conditioning when possible.  Many air conditioners have filters that trap the pollen spores. Use a HEPA room air filter to remove pollen form the indoor air you breathe.  Control of Mold Allergen   Mold and fungi can grow on a variety of surfaces provided certain temperature and moisture conditions exist.  Outdoor molds grow on plants, decaying vegetation and soil.  The major outdoor mold, Alternaria and Cladosporium, are found in very high numbers during hot and dry conditions.  Generally, a late Summer - Fall peak is seen for common outdoor fungal spores.  Rain will temporarily lower outdoor mold spore count, but counts rise rapidly when the rainy period ends.  The most important indoor molds are Aspergillus and Penicillium.  Dark, humid and poorly ventilated  basements are ideal sites for mold growth.  The next most common sites of mold growth are the bathroom and the kitchen.  Outdoor (Seasonal) Mold Control   Use air conditioning and keep windows closed Avoid exposure to decaying vegetation. Avoid leaf raking. Avoid grain  handling. Consider wearing a face mask if working in moldy areas.    Indoor (Perennial) Mold Control    Maintain humidity below 50%. Clean washable surfaces with 5% bleach solution. Remove sources e.g. contaminated carpets.

## 2023-05-27 ENCOUNTER — Other Ambulatory Visit: Payer: Self-pay | Admitting: Family Medicine

## 2023-07-17 ENCOUNTER — Ambulatory Visit: Payer: 59 | Admitting: Internal Medicine

## 2023-07-17 ENCOUNTER — Encounter: Payer: Self-pay | Admitting: Internal Medicine

## 2023-07-17 VITALS — BP 124/82 | HR 85 | Temp 97.5°F | Resp 16 | Ht 62.0 in | Wt 212.8 lb

## 2023-07-17 DIAGNOSIS — H1045 Other chronic allergic conjunctivitis: Secondary | ICD-10-CM

## 2023-07-17 DIAGNOSIS — J302 Other seasonal allergic rhinitis: Secondary | ICD-10-CM

## 2023-07-17 DIAGNOSIS — J342 Deviated nasal septum: Secondary | ICD-10-CM

## 2023-07-17 DIAGNOSIS — K219 Gastro-esophageal reflux disease without esophagitis: Secondary | ICD-10-CM | POA: Diagnosis not present

## 2023-07-17 DIAGNOSIS — J3089 Other allergic rhinitis: Secondary | ICD-10-CM

## 2023-07-17 DIAGNOSIS — J4530 Mild persistent asthma, uncomplicated: Secondary | ICD-10-CM | POA: Diagnosis not present

## 2023-07-17 MED ORDER — AIRSUPRA 90-80 MCG/ACT IN AERO: 2.0000 | INHALATION_SPRAY | Freq: Four times a day (QID) | RESPIRATORY_TRACT | 5 refills | Status: DC | PRN

## 2023-07-17 NOTE — Patient Instructions (Addendum)
 Chronic Rhinitis  Increased sneezing, runny nose, and sinus issues since COVID-19 infection two years ago. Symptoms exacerbated by exposure to air conditioning, strong odors, and dog fur. No relief with Benadryl, Flonase , and Tylenol . Also, seasonal hay fever in the fall. - Allergy  test: positive to grass, weed, tree, mold, tobacoo leaf  - Continue avoidance of irritant and allergic triggers  - Continue  Flonase  1 spray per nostril twice daily.  Aim upward and outward - Continue Astelin  2 sprays into both nostrils twice daily - Continue Xyzal  5 mg daily - Information given on allergy  injections, [place] and STANDARD build up CPT coded given  - Will double check negatives with blood work.   Asthma: mild persistent  New onset post-COVID-19 infection. Symptoms of shortness of breath and wheezing, more than twice a week. Albuterol  provides relief.  Breathing test: showed possible inflammation  - During respiratory illness or asthma flares: Start Flovent  4 puffs  and continue for 2 weeks or until symptoms resolve. - Rescue Inhaler: Airsupra  2 puffs. Use  every 4-6 hours as needed for chest tightness, wheezing, or coughing.  Can also use 15 minutes prior to exercise if you have symptoms with activity. - Asthma is not controlled if:  - Symptoms are occurring >2 times a week OR  - >2 times a month nighttime awakenings  - You are requiring systemic steroids (prednisone /steroid injections) more than once per year  - Your require hospitalization for your asthma.  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 Symptoms resolved with avoidance. -Continue avoidance of acrylates, particularly nail glue.  Gastroesophageal Reflux Disease (GERD) Controlled with medication, occasional breakthrough symptoms with certain foods. -Continue pantoprazole  20 mg daily -Advise on diet control  to prevent reflux-induced respiratory symptoms.  Follow-up: we will call you with blood work, let us  know if you would like to start injections   Follow up: in clinic in 6 months   Thank you so much for letting me partake in your care today.  Don't hesitate to reach out if you have any additional concerns!  Orelia Binet, MD  [facility] [place], [place]

## 2023-07-17 NOTE — Progress Notes (Signed)
 FOLLOW UP Date of Service/Encounter:  07/17/23  Subjective:  Traci Frank (DOB: Mar 05, 1981) is a 43 y.o. female who [facility] in re-evaluation of the following: allergic rhinitis  History obtained from: chart review and patient.  For Review, LV was on 04/12/23  with Dr. Jolayne Natter seen for  skin testing appointment  . See below for summary of history and diagnostics.  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 Discussed the use of AI scribe software for clinical note transcription with the patient, who gave verbal consent to proceed.  History of Present Illness   Traci Frank is a 43 year old female with asthma and allergic rhinitis who presents with concerns about medication side effects and allergy  management.  She is concerned about side effects from her asthma medications. She uses montelukast  but does not have her daily inhaler, Flovent . She experiences sporadic wheezing, particularly at night, about twice a week, and shortness of breath when climbing stairs or walking uphill. She does not use her rescue inhaler and has no nocturnal symptoms.  She was evaluated by an ENT specialist at [facility], where she was diagnosed with a deviated septum and advised to consider allergy  shots. During a flare-up, "signs of an allergic reaction were noted in her nose" per ENT. She experiences sneezing and rhinorrhea, attributed to  both allergic and nonallergic triggers, including strong odors and cold air. She is uncertain about the need for further allergy  testing, as previous skin tests showed no allergies, although she experienced prolonged itching from two test sites.  She has a history of chronic  rhinitis , with symptoms worsening after COVID-19. She experiences constant pressure, pain, and drainage, which she associates with her deviated septum. Her symptoms have worsened since the pandemic, with increased allergic inflammation. She is interested in AIT.   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 She tested this sensitivity by being in proximity to nail treatments without direct contact and did not have a reaction, suggesting the reaction may be due to direct exposure.  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         All medications reviewed by clinical staff and updated in chart. No new pertinent medical or surgical history except as noted in HPI.  ROS: All others negative except as noted per HPI.   Objective:  BP 124/82   Pulse 85   Temp (!) 97.5 F (36.4 C) (Temporal)   Resp 16   Ht 5\' 2"  (1.575 m)   Wt 212 lb 12.8 oz (96.5 kg)   LMP 04/13/2020   SpO2 99%   BMI 38.92 kg/m  Body mass index is 38.92 kg/m. Physical Exam: General Appearance:  Alert, cooperative, no distress, appears stated age  Head:  Normocephalic, without obvious abnormality, atraumatic  Eyes:  Conjunctiva clear, EOM's intact  Ears EACs normal bilaterally  Nose: Nares normal,  erythematous nasal mucosa , hypertrophic turbinates, and no visible anterior polyps  Throat: Lips, tongue normal; teeth and gums normal, + cobblestoning  Neck: Supple, symmetrical  Lungs:   clear to auscultation bilaterally, Respirations unlabored, no coughing  Heart:  regular rate and rhythm and no murmur, Appears well perfused  Extremities: No edema   Skin: Skin color, texture, turgor normal and no rashes or lesions on visualized portions of skin  Neurologic: No gross deficits   Labs:  Lab Orders         Allergens, Zone 2      Spirometry:  Tracings reviewed. Her effort: Good reproducible efforts. FVC: 2.10L FEV1: 1.76L, 73% predicted FEV1/FVC ratio: 84% Interpretation: Spirometry consistent with possible restrictive disease.  Please see scanned spirometry results for details.   Assessment/Plan   Chronic Rhinitis  Increased sneezing, runny nose, and sinus issues since COVID-19 infection two years ago. Symptoms exacerbated by exposure to air conditioning, strong odors, and dog fur. No relief with Benadryl, Flonase , and Tylenol . Also, seasonal hay fever in the fall. - Allergy  test: positive to grass, weed, tree, mold, tobacoo leaf  - Continue avoidance of irritant and allergic triggers  - Continue  Flonase  1 spray per nostril twice daily.  Aim upward and outward - Continue Astelin  2 sprays into both nostrils twice daily - Continue Xyzal  5 mg daily - Information given on allergy  injections, RUSH and STANDARD build up CPT coded given  - Will double check negatives with blood work.   Asthma: mild persistent  New onset post-COVID-19 infection. Symptoms of shortness of breath and wheezing, more than twice a week. Albuterol  provides relief.  Breathing test: showed possible inflammation  - During respiratory illness or asthma flares: Start Flovent  4 puffs  and continue for 2 weeks or until symptoms resolve. - Rescue Inhaler: Airsupra  2 puffs. Use  every 4-6 hours as needed for chest tightness, wheezing, or coughing.  Can also use 15 minutes prior to exercise if you have symptoms with activity. - Asthma is not controlled if:  - Symptoms are occurring >2 times a week OR  - >2 times a month nighttime awakenings  - You are requiring systemic steroids (prednisone /steroid injections) more than once per year  - Your require hospitalization  for your asthma.  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 Symptoms resolved with avoidance. -Continue avoidance of acrylates, particularly nail glue.  Gastroesophageal Reflux Disease (GERD) Controlled with medication, occasional breakthrough symptoms with certain foods. -Continue pantoprazole  20 mg daily -Advise on diet control to prevent reflux-induced respiratory symptoms.  Follow-up: we will call you with blood work, let us  know if you would like to start injections   Follow up: in clinic in 6 months   Other:     Thank you so much for letting me partake in your care today.  Don't hesitate to reach out if you have any additional concerns!  Orelia Binet, MD  [facility] [place], [place]

## 2023-07-19 LAB — ALLERGENS, ZONE 2

## 2023-07-25 NOTE — Progress Notes (Signed)
Blood work did not show any other environmental allergies.  Can someone let patient know?.  She also needs to let us know if she would like to proceed with allergy immunotherapy.

## 2023-07-31 ENCOUNTER — Other Ambulatory Visit: Payer: Self-pay | Admitting: Internal Medicine

## 2023-07-31 DIAGNOSIS — J302 Other seasonal allergic rhinitis: Secondary | ICD-10-CM

## 2023-08-01 DIAGNOSIS — J302 Other seasonal allergic rhinitis: Secondary | ICD-10-CM

## 2023-08-01 NOTE — Progress Notes (Signed)
 VIAL ONE SET MADE 08-01-23. EXP 07-31-24

## 2023-08-01 NOTE — Progress Notes (Signed)
 Aeroallergen Immunotherapy   Ordering Provider: Dr. Ferol Luz   Patient Details  Name: Traci Frank  MRN: [id number]  Date of Birth: [birth year]   Order 2 of 2   Vial Label: G-W-T   0.3 ml (Volume)  BAU Concentration -- 7 Grass Mix* 100,000 ([address] [place], [place], [place], [place], [place], Sweet [place], [place])  0.2 ml (Volume)  1:20 Concentration -- [place]  0.5 ml (Volume)  1:20 Concentration -- Weed Mix*  0.5 ml (Volume)  1:20 Concentration -- Eastern 10 Tree Mix (also Sweet Gum)    1.5  ml Extract Subtotal  3.5  ml Diluent  5.0  ml Maintenance Total   Schedule:  RUSH (10 doses)  Silver Vial (1:1,000,000): RUSH  Blue Vial (1:100,000): RUSH  Yellow Vial (1:10,000): RUSH  Green Vial (1:1,000): Schedule B (6 doses)  Red Vial (1:100): Schedule A (11 doses)   Special Instructions: After completion of the first Red Vial, please space to every two weeks. After completion of the second Red Vial, please space to every 4 weeks. Ok to up dose new vials at 0.68mL --> 0.3 mL --> 0.5 mL. Ok to come twice weekly, if desired, as long as there is 48 hours between injections.

## 2023-08-01 NOTE — Progress Notes (Signed)
 Aeroallergen Immunotherapy  Ordering Provider: Dr. Ferol Luz  Patient Details Name: Traci Frank MRN: [id number] Date of Birth: [birth year]  Order 1 of 2  Vial Label: M  0.2 ml (Volume)  1:20 Concentration -- Alternaria alternata 0.2 ml (Volume)  1:20 Concentration -- Cladosporium herbarum 0.2 ml (Volume)  1:10 Concentration -- Aspergillus mix 0.2 ml (Volume)  1:10 Concentration -- Penicillium mix   0.8  ml Extract Subtotal 4.2  ml Diluent 5.0  ml Maintenance Total  Schedule:  RUSH (10 doses) Silver Vial (1:1,000,000): RUSH Blue Vial (1:100,000): RUSH Yellow Vial (1:10,000): RUSH Green Vial (1:1,000): Schedule B (6 doses) Red Vial (1:100): Schedule A (11 doses)  Special Instructions: After completion of the first Red Vial, please space to every two weeks. After completion of the second Red Vial, please space to every 4 weeks. Ok to up dose new vials at 0.53mL --> 0.3 mL --> 0.5 mL. Ok to come twice weekly, if desired, as long as there is 48 hours between injections.

## 2023-08-03 DIAGNOSIS — J301 Allergic rhinitis due to pollen: Secondary | ICD-10-CM | POA: Diagnosis not present

## 2023-08-03 NOTE — Progress Notes (Signed)
 VIAL SET TWO MADE 08-02-23. EXP 08-01-24

## 2023-08-10 ENCOUNTER — Telehealth: Payer: Self-pay

## 2023-08-10 MED ORDER — PREDNISONE 20 MG PO TABS: ORAL_TABLET | ORAL | 0 refills | Status: DC

## 2023-08-10 MED ORDER — EPINEPHRINE 0.3 MG/0.3ML IJ SOAJ: 0.3000 mg | INTRAMUSCULAR | 1 refills | Status: AC | PRN

## 2023-08-10 MED ORDER — FAMOTIDINE 20 MG PO TABS: ORAL_TABLET | ORAL | 0 refills | Status: AC

## 2023-08-10 NOTE — Telephone Encounter (Signed)
 Pre-meds instructions sent to MyChart and meds sent to [facility], pt called back and instructions given.

## 2023-08-18 ENCOUNTER — Ambulatory Visit: Payer: 59 | Admitting: Internal Medicine

## 2023-08-25 ENCOUNTER — Ambulatory Visit: Admitting: Internal Medicine

## 2023-08-25 VITALS — BP 126/78 | HR 86 | Temp 98.6°F | Resp 16

## 2023-08-25 DIAGNOSIS — J302 Other seasonal allergic rhinitis: Secondary | ICD-10-CM

## 2023-08-25 DIAGNOSIS — J3089 Other allergic rhinitis: Secondary | ICD-10-CM

## 2023-08-25 NOTE — Progress Notes (Signed)
 RAPID DESENSITIZATION Note  RE: Traci Frank MRN: [id number] DOB: 1980-06-22 Date of Office Visit: 08/25/2023  Subjective:  Patient presents today for rapid desensitization.  Interval History: Patient has not been ill, she has taken all premedications as per protocol.  Recent/Current History: Pulmonary disease: no Cardiac disease: no Respiratory infection: no Rash: no Itch: no Swelling: no Cough: no Shortness of breath: no Runny/stuffy nose: no Itchy eyes: no Beta-blocker use: no  Patient/guardian was informed of the procedure with verbalized understanding of the risk of anaphylaxis. Consent has been signed.   Medication List:  Current Outpatient Medications  Medication Sig Dispense Refill   albuterol (VENTOLIN HFA) 108 (90 Base) MCG/ACT inhaler Inhale 2 puffs into the lungs every 4 (four) hours as needed for wheezing or shortness of breath. 8 g 2   Albuterol-Budesonide (AIRSUPRA) 90-80 MCG/ACT AERO Inhale 2 puffs into the lungs 4 (four) times daily as needed (cough, wheeze, dyspnea). 10.7 g 5   amLODipine (NORVASC) 10 MG tablet Take 1 tablet (10 mg total) by mouth daily. 90 tablet 2   azelastine (ASTELIN) 0.1 % nasal spray Place 2 sprays into both nostrils 2 (two) times daily. Use in each nostril as directed 30 mL 12   EPINEPHrine 0.3 mg/0.3 mL IJ SOAJ injection Inject 0.3 mg into the muscle as needed for anaphylaxis. 1 each 1   famotidine (PEPCID) 20 MG tablet Take 1 tabs Thursday and Friday twice daily before RUSH appt. 4 tablet 0   fluticasone (FLONASE) 50 MCG/ACT nasal spray Place 1 spray into both nostrils daily. 16 g 2   fluticasone (FLOVENT HFA) 44 MCG/ACT inhaler INHALE 2 PUFFS INTO THE LUNGS TWICE A DAY 10.6 each 12   hydrochlorothiazide (MICROZIDE) 12.5 MG capsule Take 1 capsule (12.5 mg total) by mouth daily. 90 capsule 1   ipratropium (ATROVENT) 0.03 % nasal spray Place 2 sprays into both nostrils 2 (two) times daily. 30 mL 0   levocetirizine (XYZAL) 5 MG  tablet TAKE 1 TABLET BY MOUTH EVERY DAY IN THE EVENING 30 tablet 2   linaclotide (LINZESS) 72 MCG capsule Take 1 capsule (72 mcg total) by mouth daily before breakfast. 90 capsule 2   montelukast (SINGULAIR) 10 MG tablet Take 1 tablet (10 mg total) by mouth at bedtime. 90 tablet 1   Multiple Vitamin (THERA) TABS Take 1 tablet by mouth daily.     pantoprazole (PROTONIX) 20 MG tablet Take 1 tablet (20 mg total) by mouth daily. 90 tablet 1   predniSONE (DELTASONE) 20 MG tablet Take 2 tabs Thursday and Friday morning before RUSH appt. 4 tablet 0   valsartan (DIOVAN) 160 MG tablet Take 1 tablet (160 mg total) by mouth daily. 90 tablet 1   Vitamin D, Ergocalciferol, (DRISDOL) 1.25 MG (50000 UNIT) CAPS capsule TAKE 1 TABLET BY MOUTH ONE TIME PER WEEK 12 capsule 1   No current facility-administered medications for this visit.   Allergies: Allergies  Allergen Reactions   Latex Itching   I reviewed her past medical history, social history, family history, and environmental history and no significant changes have been reported from her previous visit.  ROS: Negative except as per HPI.  Objective: BP 126/80   Pulse 78   Temp 97.7 F (36.5 C) (Temporal)   Resp 17   LMP 04/13/2020   SpO2 100%  There is no height or weight on file to calculate BMI.   General Appearance:  Alert, cooperative, no distress, appears stated age  Head:  Normocephalic, without obvious abnormality,  atraumatic  Eyes:  Conjunctiva clear, EOM's intact  Nose: Nares normal  Throat: Lips, tongue normal; teeth and gums normal, normal  posterior oropharnyx  Neck: Supple, symmetrical  Lungs:   CTAB , Respirations unlabored, no coughing  Heart:  Appears well perfused  Extremities: No edema  Skin: Skin color, texture, turgor normal, no rashes or lesions on visualized portions of skin  Neurologic: No gross deficits     Diagnostics: None   PROCEDURES:  Patient received the following doses every hour: Step 1:  0.51ml -  1:1,000,000 dilution (silver vial) Step 2:  0.80ml - 1:1,000,000 dilution (silver vial) Step 3: 0.23ml - 1:100,000 dilution (blue vial)  Step 4: 0.61ml - 1:100,000 dilution (blue vial)  Step 5: 0.80ml - 1:10,000 dilution (gold vial) Step 6: 0.9ml - 1:10,000 dilution (gold vial) Step 7: 0.40ml - 1:10,000 dilution (gold vial) Step 8: 0.35ml - 1:10,000 dilution (gold vial)  Patient was observed for 1 hour after the last dose.   Procedure started at 910 Procedure ended at 400   ASSESSMENT/PLAN:   Patient has tolerated the rapid desensitization protocol.  Next appointment: Start at 0.65ml of 1:1000 dilution (green vial) and build up per protocol.

## 2023-08-30 ENCOUNTER — Other Ambulatory Visit: Payer: Self-pay | Admitting: Family Medicine

## 2023-08-30 DIAGNOSIS — K219 Gastro-esophageal reflux disease without esophagitis: Secondary | ICD-10-CM

## 2023-09-01 ENCOUNTER — Ambulatory Visit (INDEPENDENT_AMBULATORY_CARE_PROVIDER_SITE_OTHER): Payer: Self-pay

## 2023-09-01 DIAGNOSIS — J309 Allergic rhinitis, unspecified: Secondary | ICD-10-CM | POA: Diagnosis not present

## 2023-09-06 ENCOUNTER — Ambulatory Visit (INDEPENDENT_AMBULATORY_CARE_PROVIDER_SITE_OTHER)

## 2023-09-06 DIAGNOSIS — J309 Allergic rhinitis, unspecified: Secondary | ICD-10-CM | POA: Diagnosis not present

## 2023-09-18 ENCOUNTER — Ambulatory Visit (INDEPENDENT_AMBULATORY_CARE_PROVIDER_SITE_OTHER)

## 2023-09-18 ENCOUNTER — Other Ambulatory Visit: Payer: Self-pay

## 2023-09-18 DIAGNOSIS — J3089 Other allergic rhinitis: Secondary | ICD-10-CM

## 2023-09-18 DIAGNOSIS — J302 Other seasonal allergic rhinitis: Secondary | ICD-10-CM | POA: Diagnosis not present

## 2023-09-18 MED ORDER — FLUTICASONE PROPIONATE HFA 44 MCG/ACT IN AERO: 2.0000 | INHALATION_SPRAY | Freq: Two times a day (BID) | RESPIRATORY_TRACT | 5 refills | Status: AC

## 2023-09-18 MED ORDER — AZELASTINE HCL 0.1 % NA SOLN: 2.0000 | Freq: Two times a day (BID) | NASAL | 5 refills | Status: DC

## 2023-09-18 MED ORDER — IPRATROPIUM BROMIDE 0.03 % NA SOLN: 2.0000 | Freq: Two times a day (BID) | NASAL | 5 refills | Status: AC

## 2023-09-18 MED ORDER — MONTELUKAST SODIUM 10 MG PO TABS: 10.0000 mg | ORAL_TABLET | Freq: Every day | ORAL | 1 refills | Status: DC

## 2023-09-20 ENCOUNTER — Telehealth: Payer: Self-pay | Admitting: *Deleted

## 2023-09-20 NOTE — Telephone Encounter (Signed)
 Received [facility] that Flovent 44 is not covered and an alternative is requested. Will need to check formulary for covered options.

## 2023-09-20 NOTE — Telephone Encounter (Signed)
 Looks like Asmanex and Qvar are preferred. Please advise.

## 2023-09-25 MED ORDER — QVAR REDIHALER 40 MCG/ACT IN AERB: 2.0000 | INHALATION_SPRAY | Freq: Two times a day (BID) | RESPIRATORY_TRACT | 5 refills | Status: DC

## 2023-09-25 NOTE — Addendum Note (Signed)
 Addended by: Angelena Barber D on: 09/25/2023 12:06 PM   Modules accepted: Orders

## 2023-09-25 NOTE — Telephone Encounter (Signed)
 We can do qvar  40mcg 2 puffs twice daily.

## 2023-09-25 NOTE — Telephone Encounter (Signed)
 Sent in Qvar - I will call pharmacy ion a bit to check coverage.

## 2023-09-27 ENCOUNTER — Ambulatory Visit (INDEPENDENT_AMBULATORY_CARE_PROVIDER_SITE_OTHER): Payer: Self-pay

## 2023-09-27 DIAGNOSIS — J309 Allergic rhinitis, unspecified: Secondary | ICD-10-CM

## 2023-10-06 ENCOUNTER — Ambulatory Visit (INDEPENDENT_AMBULATORY_CARE_PROVIDER_SITE_OTHER)

## 2023-10-06 DIAGNOSIS — J309 Allergic rhinitis, unspecified: Secondary | ICD-10-CM | POA: Diagnosis not present

## 2023-10-12 ENCOUNTER — Encounter: Payer: Self-pay | Admitting: Family Medicine

## 2023-10-13 ENCOUNTER — Ambulatory Visit (INDEPENDENT_AMBULATORY_CARE_PROVIDER_SITE_OTHER)

## 2023-10-13 DIAGNOSIS — J309 Allergic rhinitis, unspecified: Secondary | ICD-10-CM

## 2023-10-23 ENCOUNTER — Ambulatory Visit
Admission: EM | Admit: 2023-10-23 | Discharge: 2023-10-23 | Disposition: A | Discharge status: Home / Self Care | Attending: Family Medicine | Admitting: Family Medicine

## 2023-10-23 ENCOUNTER — Emergency Department (HOSPITAL_BASED_OUTPATIENT_CLINIC_OR_DEPARTMENT_OTHER): Attending: Urgent Care

## 2023-10-23 DIAGNOSIS — M25552 Pain in left hip: Secondary | ICD-10-CM

## 2023-10-23 MED ORDER — CYCLOBENZAPRINE HCL 5 MG PO TABS
5.0000 mg | ORAL_TABLET | Freq: Every evening | ORAL | 0 refills | Status: DC | PRN
Start: 2023-10-23 — End: 2023-12-14

## 2023-10-23 MED ORDER — NAPROXEN 500 MG PO TABS: 500.0000 mg | ORAL_TABLET | Freq: Two times a day (BID) | ORAL | 0 refills | Status: DC

## 2023-10-23 NOTE — ED Triage Notes (Signed)
 Pt c/o left hip pain x 1 week-denies injury-taking tylenol , epsom salt soaks-NAD-limping gait

## 2023-10-23 NOTE — Discharge Instructions (Addendum)
 I have placed orders to have an x-ray done at the med center in [facility].  Please check in through the emergency room but do not register to be seen as a patient.  They will contact the radiology department and a staff member will take you to go get the x-ray done.   For now you can use naproxen  for pain and inflammation, cyclobenzaprine  as a muscle relaxant. Establish with an orthopedist for further management including consideration for physical therapy.

## 2023-10-23 NOTE — ED Provider Notes (Signed)
 [facility]  Note:  This document was prepared using Conservation officer, historic buildings and may include unintentional dictation errors.  MRN: [id number] DOB: [birth year]  Subjective:   Traci Frank is a 43 y.o. female presenting for 1 week history of persistent moderate to severe internal left hip pain.  No fall, trauma, weakness, numbness or tingling, changes to bowel or urinary habits.  No low back pain.  Patient does a lot of standing, walking, crouching for her work.  Has used Tylenol  consistently without relief.  No history of musculoskeletal disorders.  No current facility-administered medications for this encounter.  Current Outpatient Medications:    albuterol  (VENTOLIN  HFA) 108 (90 Base) MCG/ACT inhaler, Inhale 2 puffs into the lungs every 4 (four) hours as needed for wheezing or shortness of breath., Disp: 8 g, Rfl: 2   Albuterol -Budesonide (AIRSUPRA ) 90-80 MCG/ACT AERO, Inhale 2 puffs into the lungs 4 (four) times daily as needed (cough, wheeze, dyspnea)., Disp: 10.7 g, Rfl: 5   amLODipine  (NORVASC ) 10 MG tablet, Take 1 tablet (10 mg total) by mouth daily., Disp: 90 tablet, Rfl: 2   azelastine  (ASTELIN ) 0.1 % nasal spray, Place 2 sprays into both nostrils 2 (two) times daily. Use in each nostril as directed, Disp: 30 mL, Rfl: 5   beclomethasone (QVAR  REDIHALER) 40 MCG/ACT inhaler, Inhale 2 puffs into the lungs 2 (two) times daily., Disp: 1 each, Rfl: 5   EPINEPHrine  0.3 mg/0.3 mL IJ SOAJ injection, Inject 0.3 mg into the muscle as needed for anaphylaxis., Disp: 1 each, Rfl: 1   famotidine  (PEPCID ) 20 MG tablet, Take 1 tabs Thursday and Friday twice daily before RUSH appt., Disp: 4 tablet, Rfl: 0   fluticasone  (FLONASE ) 50 MCG/ACT nasal spray, Place 1 spray into both nostrils daily., Disp: 16 g, Rfl: 2   fluticasone  (FLOVENT  HFA) 44 MCG/ACT inhaler, Inhale 2 puffs into the lungs 2 (two) times daily., Disp: 10.6 each, Rfl: 5   hydrochlorothiazide  (MICROZIDE )  12.5 MG capsule, Take 1 capsule (12.5 mg total) by mouth daily., Disp: 90 capsule, Rfl: 1   ipratropium (ATROVENT ) 0.03 % nasal spray, Place 2 sprays into both nostrils 2 (two) times daily., Disp: 30 mL, Rfl: 5   levocetirizine (XYZAL ) 5 MG tablet, TAKE 1 TABLET BY MOUTH EVERY DAY IN THE EVENING, Disp: 30 tablet, Rfl: 2   linaclotide  (LINZESS ) 72 MCG capsule, Take 1 capsule (72 mcg total) by mouth daily before breakfast., Disp: 90 capsule, Rfl: 2   montelukast  (SINGULAIR ) 10 MG tablet, Take 1 tablet (10 mg total) by mouth at bedtime., Disp: 90 tablet, Rfl: 1   Multiple Vitamin (THERA) TABS, Take 1 tablet by mouth daily., Disp: , Rfl:    pantoprazole  (PROTONIX ) 20 MG tablet, TAKE 1 TABLET BY MOUTH EVERY DAY, Disp: 30 tablet, Rfl: 5   predniSONE  (DELTASONE ) 20 MG tablet, Take 2 tabs Thursday and Friday morning before RUSH appt., Disp: 4 tablet, Rfl: 0   valsartan  (DIOVAN ) 160 MG tablet, Take 1 tablet (160 mg total) by mouth daily., Disp: 90 tablet, Rfl: 1   Vitamin D , Ergocalciferol , (DRISDOL) 1.25 MG (50000 UNIT) CAPS capsule, TAKE 1 TABLET BY MOUTH ONE TIME PER WEEK, Disp: 12 capsule, Rfl: 1   Allergies  Allergen Reactions   Latex Itching    Past Medical History:  Diagnosis Date   Abnormal involuntary movements(781.0)    Right hand   Allergy     Anemia    Anxiety    Asthma    Depression  Disturbance of skin sensation    Right arm   GERD (gastroesophageal reflux disease)    Headache(784.0)    Hypertension    Tingling sensation 05/27/2013   Tremor of unknown origin 05/27/2013   Uterine fibroid    Vertigo      Past Surgical History:  Procedure Laterality Date   HERNIA REPAIR     HYSTERECTOMY ABDOMINAL WITH SALPINGECTOMY Bilateral 05/07/2020   Procedure: EXPLORATORY LAPAROTOMY,TOTAL HYSTERECTOMY ABDOMINAL WITH BILATERAL  SALPINGECTOMY GREATER THAN 250GM;  Surgeon: Alphonso Aschoff, MD;  Location: [facility];  Service: Gynecology;  Laterality: Bilateral;   MYOMECTOMY     OMENTECTOMY N/A  05/07/2020   Procedure: OMENTECTOMY, RADICAL TUMOR DEBULKING;  Surgeon: Alphonso Aschoff, MD;  Location: [facility];  Service: Gynecology;  Laterality: N/A;   TOTAL ABDOMINAL HYSTERECTOMY Bilateral 05/07/2020    Family History  Problem Relation Age of Onset   Hypertension Mother    Arthritis Mother    Varicose Veins Mother    Drug abuse Father    Breast cancer Maternal Aunt    Asthma Maternal Aunt    Cancer Maternal Aunt    COPD Maternal Aunt    Breast cancer Paternal Aunt    Alcohol abuse Paternal Aunt    Cancer Paternal Aunt    Diabetes Maternal Grandmother    Asthma Maternal Grandmother    Obesity Maternal Grandmother    Diabetes Maternal Grandfather    COPD Paternal Grandmother    Obesity Paternal Grandmother    Stroke Paternal Grandfather    Bipolar disorder Cousin     Social History   Tobacco Use   Smoking status: Never    Passive exposure: Never   Smokeless tobacco: Never  Vaping Use   Vaping status: Never Used  Substance Use Topics   Alcohol use: Not Currently   Drug use: No    ROS   Objective:   Vitals: BP 125/89 (BP Location: Right Arm)   Pulse 89   Temp 98.7 F (37.1 C) (Oral)   Resp 20   LMP 04/13/2020   SpO2 95%   Physical Exam Constitutional:      General: She is not in acute distress.    Appearance: Normal appearance. She is well-developed. She is not ill-appearing, toxic-appearing or diaphoretic.  HENT:     Head: Normocephalic and atraumatic.     Nose: Nose normal.     Mouth/Throat:     Mouth: Mucous membranes are moist.  Eyes:     General: No scleral icterus.       Right eye: No discharge.        Left eye: No discharge.     Extraocular Movements: Extraocular movements intact.  Cardiovascular:     Rate and Rhythm: Normal rate.  Pulmonary:     Effort: Pulmonary effort is normal.  Musculoskeletal:     Left hip: Tenderness (internal) and bony tenderness present. No deformity, lacerations or crepitus. Normal range of motion. Normal  strength.       Legs:  Skin:    General: Skin is warm and dry.  Neurological:     General: No focal deficit present.     Mental Status: She is alert and oriented to person, place, and time.  Psychiatric:        Mood and Affect: Mood normal.        Behavior: Behavior normal.     Assessment and Plan :   PDMP not reviewed this encounter.  1. Left hip pain    Will pursue  outpatient imaging.  For now, recommend naproxen  for pain and inflammation, cyclobenzaprine  as a muscle relaxant.  Creatinine clearance calculated at 128 mL/min.  Counseled patient on potential for adverse effects with medications prescribed/recommended today, ER and return-to-clinic precautions discussed, patient verbalized understanding.    Adolph Hoop, [place] 10/23/23 1840

## 2023-10-24 ENCOUNTER — Ambulatory Visit: Payer: Self-pay | Admitting: Urgent Care

## 2023-10-25 ENCOUNTER — Ambulatory Visit (INDEPENDENT_AMBULATORY_CARE_PROVIDER_SITE_OTHER)

## 2023-10-25 DIAGNOSIS — J309 Allergic rhinitis, unspecified: Secondary | ICD-10-CM

## 2023-11-09 ENCOUNTER — Ambulatory Visit (INDEPENDENT_AMBULATORY_CARE_PROVIDER_SITE_OTHER)

## 2023-11-09 DIAGNOSIS — J309 Allergic rhinitis, unspecified: Secondary | ICD-10-CM | POA: Diagnosis not present

## 2023-11-17 ENCOUNTER — Ambulatory Visit (INDEPENDENT_AMBULATORY_CARE_PROVIDER_SITE_OTHER): Admitting: *Deleted

## 2023-11-17 DIAGNOSIS — J309 Allergic rhinitis, unspecified: Secondary | ICD-10-CM

## 2023-11-29 ENCOUNTER — Ambulatory Visit (INDEPENDENT_AMBULATORY_CARE_PROVIDER_SITE_OTHER)

## 2023-11-29 DIAGNOSIS — J309 Allergic rhinitis, unspecified: Secondary | ICD-10-CM | POA: Diagnosis not present

## 2023-12-13 ENCOUNTER — Ambulatory Visit (INDEPENDENT_AMBULATORY_CARE_PROVIDER_SITE_OTHER)

## 2023-12-13 DIAGNOSIS — J309 Allergic rhinitis, unspecified: Secondary | ICD-10-CM | POA: Diagnosis not present

## 2023-12-14 ENCOUNTER — Ambulatory Visit (INDEPENDENT_AMBULATORY_CARE_PROVIDER_SITE_OTHER): Admitting: Urgent Care

## 2023-12-14 VITALS — BP 121/85 | HR 91 | Resp 18 | Ht 62.0 in | Wt 220.5 lb

## 2023-12-14 DIAGNOSIS — K5904 Chronic idiopathic constipation: Secondary | ICD-10-CM | POA: Diagnosis not present

## 2023-12-14 DIAGNOSIS — J302 Other seasonal allergic rhinitis: Secondary | ICD-10-CM

## 2023-12-14 DIAGNOSIS — G4709 Other insomnia: Secondary | ICD-10-CM | POA: Diagnosis not present

## 2023-12-14 DIAGNOSIS — Z0000 Encounter for general adult medical examination without abnormal findings: Secondary | ICD-10-CM

## 2023-12-14 DIAGNOSIS — E559 Vitamin D deficiency, unspecified: Secondary | ICD-10-CM

## 2023-12-14 DIAGNOSIS — R7303 Prediabetes: Secondary | ICD-10-CM

## 2023-12-14 DIAGNOSIS — R0683 Snoring: Secondary | ICD-10-CM

## 2023-12-14 DIAGNOSIS — E049 Nontoxic goiter, unspecified: Secondary | ICD-10-CM

## 2023-12-14 DIAGNOSIS — K219 Gastro-esophageal reflux disease without esophagitis: Secondary | ICD-10-CM | POA: Diagnosis not present

## 2023-12-14 DIAGNOSIS — I10 Essential (primary) hypertension: Secondary | ICD-10-CM

## 2023-12-14 DIAGNOSIS — Z113 Encounter for screening for infections with a predominantly sexual mode of transmission: Secondary | ICD-10-CM

## 2023-12-14 MED ORDER — HYDROCHLOROTHIAZIDE 12.5 MG PO CAPS: 12.5000 mg | ORAL_CAPSULE | Freq: Every day | ORAL | 1 refills | Status: AC

## 2023-12-14 MED ORDER — AMLODIPINE BESYLATE 10 MG PO TABS: 10.0000 mg | ORAL_TABLET | Freq: Every day | ORAL | 2 refills | Status: AC

## 2023-12-14 MED ORDER — PANTOPRAZOLE SODIUM 20 MG PO TBEC: 20.0000 mg | DELAYED_RELEASE_TABLET | Freq: Every day | ORAL | 5 refills | Status: AC

## 2023-12-14 MED ORDER — LEVOCETIRIZINE DIHYDROCHLORIDE 5 MG PO TABS: 5.0000 mg | ORAL_TABLET | Freq: Every evening | ORAL | 2 refills | Status: AC

## 2023-12-14 MED ORDER — LINACLOTIDE 72 MCG PO CAPS: 72.0000 ug | ORAL_CAPSULE | Freq: Every day | ORAL | 2 refills | Status: AC

## 2023-12-14 MED ORDER — AMITRIPTYLINE HCL 25 MG PO TABS: 25.0000 mg | ORAL_TABLET | Freq: Every day | ORAL | 0 refills | Status: DC

## 2023-12-14 MED ORDER — VALSARTAN 160 MG PO TABS: 160.0000 mg | ORAL_TABLET | Freq: Every day | ORAL | 1 refills | Status: AC

## 2023-12-14 MED ORDER — MONTELUKAST SODIUM 10 MG PO TABS: 10.0000 mg | ORAL_TABLET | Freq: Every day | ORAL | 1 refills | Status: AC

## 2023-12-14 NOTE — Patient Instructions (Addendum)
 We completed your annual physical today. We also drew your fasting labs.  Please start elavil  taken at night, 30 min prior to bed. Please follow up with sleep medicine for possible sleep study.  Please return in 6-8 weeks.

## 2023-12-14 NOTE — Progress Notes (Signed)
 Complete physical exam  Patient: Traci Frank   DOB: May 20, 1981   43 y.o. Female  MRN: [id number]  Subjective:    Chief Complaint  Patient presents with   Transitions Of Care    Physical    Traci Frank is a 43 y.o. female who presents today for a complete physical exam. She reports consuming a general diet. The patient does not participate in regular exercise at present. She generally feels fairly well. She reports sleeping fairly well. She does have additional problems to discuss today.   Discussed the use of AI scribe software for clinical note transcription with the patient, who gave verbal consent to proceed.  History of Present Illness   The patient presents today for annual physical.  The patient is managing their blood pressure with valsartan , hydrochlorothiazide , and amlodipine , well controlled.  They are experiencing challenges with weight management, noting a lack of physical activity and unsuccessful attempts with various diets. They report losing inches but not pounds and express frustration over weight gain despite clothes fitting well. Their weight was stable for a long time before a significant increase, which they feel has contributed to new health issues such as asthma and allergies.  The patient underwent a hysterectomy with exploratory surgery due to fibroids and suspected stomach cancer, which was later ruled out. They believe this surgery has led to new health issues, including hormonal changes and skin problems like acne, which they did not experience before.  They have a history of sacroiliitis, causing deep hip pain, and use naproxen  and cyclobenzaprine  as needed for pain management, being cautious with naproxen  due to potential stomach impact.  They experience sleep disturbances, describing themselves as a 'night owl' with difficulty falling asleep. They have a history of depression and anxiety, which has improved, and they no longer take medication for  these conditions. Trazodone  was previously tried for sleep but was ineffective and caused unwanted side effects.  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 They often rely on snacks due to their busy work life and eat late at night.  They have a history of low vitamin D  levels and experience persistent fatigue, even after adequate sleep. They have not completed a sleep study but are considering it due to concerns about sleep apnea.  Current medications include pantoprazole  for stomach issues, and they have been prescribed Linzess  for bowel movements. Has worked in the past and pt is in need of a refill. They use albuterol  for asthma and have several allergy  medications, including azelastine , Flonase , Atrovent , and Singulair , but primarily rely on albuterol  for symptom management. Pt follows with a specialist.  Discussed vaccines today - pt deferring vaccinations.      Most recent fall risk assessment:    01/19/2023    9:51 AM  Fall Risk   Falls in the past year? 0  Number falls in past yr: 0  Injury with Fall? 0  Risk for fall due to : No Fall Risks  Follow up Falls evaluation completed     Most recent depression screenings:    01/19/2023    9:51 AM 07/13/2021    5:31 PM  PHQ 2/9 Scores  PHQ - 2 Score 0 0  PHQ- 9 Score 6 0    Vision:Not within last year  and Dental: No current dental problems and Receives regular dental care  Patient Active Problem List   Diagnosis Date Noted   Wheezing  02/27/2023   Seasonal allergies 02/16/2023   Rhinorrhea 02/16/2023   Snoring 02/16/2023   Other fatigue 02/16/2023   Primary hypertension 01/19/2023   Asthma with acute exacerbation 07/14/2021   GERD (gastroesophageal reflux disease) 07/14/2021   Pelvic mass in female 05/07/2020   Leiomyomatosis 03/30/2020   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   Obesity (BMI 30-39.9) 03/30/2020   Iron  deficiency anemia due to chronic blood loss 03/30/2020   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   Major depressive disorder, recurrent episode, in full remission (HCC) 02/01/2019   GAD (generalized anxiety disorder) 02/01/2019   Panic disorder 02/01/2019   Current severe episode of major depressive disorder without psychotic features without prior episode (HCC) 12/16/2018   Abdominal fullness in left lower quadrant 01/11/2018   History of uterine fibroid 01/11/2018   Essential hypertension 07/05/2016   Chronic idiopathic constipation 07/05/2016   Tingling sensation 05/27/2013   Tremor of unknown origin 05/27/2013   Past Medical History:  Diagnosis Date   Abnormal involuntary movements(781.0)    Right hand   Allergy     Anemia    Anxiety    Asthma    Depression    Disturbance of skin sensation    Right arm   GERD (gastroesophageal reflux disease)    Headache(784.0)    Hypertension    Tingling sensation 05/27/2013   Tremor of unknown origin 05/27/2013   Uterine fibroid    Vertigo    Past Surgical History:  Procedure Laterality Date   HERNIA REPAIR     HYSTERECTOMY ABDOMINAL WITH SALPINGECTOMY Bilateral 05/07/2020   Procedure: EXPLORATORY LAPAROTOMY,TOTAL HYSTERECTOMY ABDOMINAL WITH BILATERAL  SALPINGECTOMY GREATER THAN 250GM;  Surgeon: Eloy Herring, MD;  Location: [facility];  Service: Gynecology;  Laterality: Bilateral;   MYOMECTOMY     OMENTECTOMY N/A 05/07/2020   Procedure: OMENTECTOMY, RADICAL TUMOR DEBULKING;  Surgeon: Eloy Herring, MD;  Location: [facility];  Service: Gynecology;  Laterality: N/A;   TOTAL ABDOMINAL HYSTERECTOMY Bilateral 05/07/2020   Social History   Tobacco Use   Smoking status: Never    Passive exposure: Never   Smokeless tobacco: Never  Vaping Use   Vaping status: Never Used  Substance Use Topics   Alcohol use: Not Currently   Drug use: No      Patient Care Team: Lowella Benton CROME, PA as PCP - General (Physician Assistant)   Outpatient  Medications Prior to Visit  Medication Sig   albuterol  (VENTOLIN  HFA) 108 (90 Base) MCG/ACT inhaler Inhale 2 puffs into the lungs every 4 (four) hours as needed for wheezing or shortness of breath.   azelastine  (ASTELIN ) 0.1 % nasal spray Place 2 sprays into both nostrils 2 (two) times daily. Use in each nostril as directed   beclomethasone (QVAR  REDIHALER) 40 MCG/ACT inhaler Inhale 2 puffs into the lungs 2 (two) times daily.   EPINEPHrine  0.3 mg/0.3 mL IJ SOAJ injection Inject 0.3 mg into the muscle as needed for anaphylaxis.   famotidine  (PEPCID ) 20 MG tablet Take 1 tabs Thursday and Friday twice daily before [facility] appt.   fluticasone  (FLONASE ) 50 MCG/ACT nasal spray Place 1 spray into both nostrils daily.   fluticasone  (FLOVENT  HFA) 44 MCG/ACT inhaler Inhale 2 puffs into the lungs 2 (two) times daily.   ipratropium (ATROVENT ) 0.03 % nasal spray Place 2 sprays into both nostrils 2 (two) times daily.   Multiple Vitamin (THERA) TABS Take 1 tablet by mouth daily.   Vitamin D , Ergocalciferol , (DRISDOL) 1.25 MG (50000 UNIT) CAPS capsule TAKE 1 TABLET  BY MOUTH ONE TIME PER WEEK   [DISCONTINUED] Albuterol -Budesonide (AIRSUPRA ) 90-80 MCG/ACT AERO Inhale 2 puffs into the lungs 4 (four) times daily as needed (cough, wheeze, dyspnea).   [DISCONTINUED] cyclobenzaprine  (FLEXERIL ) 5 MG tablet Take 1 tablet (5 mg total) by mouth at bedtime as needed.   [DISCONTINUED] linaclotide  (LINZESS ) 72 MCG capsule Take 1 capsule (72 mcg total) by mouth daily before breakfast.   [DISCONTINUED] naproxen  (NAPROSYN ) 500 MG tablet Take 1 tablet (500 mg total) by mouth 2 (two) times daily with a meal.   [DISCONTINUED] predniSONE  (DELTASONE ) 20 MG tablet Take 2 tabs Thursday and Friday morning before [facility] appt.   [DISCONTINUED] amLODipine  (NORVASC ) 10 MG tablet Take 1 tablet (10 mg total) by mouth daily.   [DISCONTINUED] hydrochlorothiazide  (MICROZIDE ) 12.5 MG capsule Take 1 capsule (12.5 mg total) by mouth daily.    [DISCONTINUED] levocetirizine (XYZAL ) 5 MG tablet TAKE 1 TABLET BY MOUTH EVERY DAY IN THE EVENING   [DISCONTINUED] montelukast  (SINGULAIR ) 10 MG tablet Take 1 tablet (10 mg total) by mouth at bedtime.   [DISCONTINUED] pantoprazole  (PROTONIX ) 20 MG tablet TAKE 1 TABLET BY MOUTH EVERY DAY   [DISCONTINUED] valsartan  (DIOVAN ) 160 MG tablet Take 1 tablet (160 mg total) by mouth daily.   No facility-administered medications prior to visit.    ROS Complete 12 point ROS performed with all pertinent positives listed in HPI     Objective:     BP 121/85 (BP Location: Left Arm, Patient Position: Sitting, Cuff Size: Large)   Pulse 91   Resp 18   Ht 5' 2 (1.575 m)   Wt 220 lb 8 oz (100 kg)   LMP 04/13/2020   SpO2 100%   BMI 40.33 kg/m  BP Readings from Last 3 Encounters:  12/14/23 121/85  10/23/23 125/89  08/25/23 126/78   Wt Readings from Last 3 Encounters:  12/14/23 220 lb 8 oz (100 kg)  07/17/23 212 lb 12.8 oz (96.5 kg)  04/04/23 213 lb 11.2 oz (96.9 kg)      Physical Exam Vitals and nursing note reviewed.  Constitutional:      General: She is not in acute distress.    Appearance: Normal appearance. She is not ill-appearing, toxic-appearing or diaphoretic.  HENT:     Head: Normocephalic and atraumatic.     Right Ear: Tympanic membrane, ear canal and external ear normal. There is no impacted cerumen.     Left Ear: Tympanic membrane, ear canal and external ear normal. There is no impacted cerumen.     Nose: Nose normal.     Mouth/Throat:     Mouth: Mucous membranes are moist.     Pharynx: Oropharynx is clear. No oropharyngeal exudate or posterior oropharyngeal erythema.  Eyes:     General: No scleral icterus.       Right eye: No discharge.        Left eye: No discharge.     Extraocular Movements: Extraocular movements intact.     Pupils: Pupils are equal, round, and reactive to light.  Neck:     Thyroid : Thyromegaly present. No thyroid  mass or thyroid  tenderness.   Cardiovascular:     Rate and Rhythm: Normal rate and regular rhythm.     Pulses: Normal pulses.     Heart sounds: No murmur heard. Pulmonary:     Effort: Pulmonary effort is normal. No respiratory distress.     Breath sounds: Normal breath sounds. No stridor. No wheezing or rhonchi.  Abdominal:     General: Abdomen  is flat. Bowel sounds are normal. There is no distension.     Palpations: Abdomen is soft. There is no mass.     Tenderness: There is no abdominal tenderness. There is no guarding.     Comments: Surgical scar noted to abdomen  Musculoskeletal:     Cervical back: Normal range of motion and neck supple. No rigidity or tenderness.     Right lower leg: No edema.     Left lower leg: No edema.  Lymphadenopathy:     Cervical: No cervical adenopathy.  Skin:    General: Skin is warm and dry.     Coloration: Skin is not jaundiced.     Findings: No bruising, erythema or rash.     Comments: Mild acne to chin  Neurological:     General: No focal deficit present.     Mental Status: She is alert and oriented to person, place, and time.     Sensory: No sensory deficit.     Motor: No weakness.  Psychiatric:        Mood and Affect: Mood normal.        Behavior: Behavior normal.      No results found for any visits on 12/14/23. Last CBC Lab Results  Component Value Date   WBC 16.7 (H) 05/08/2020   HGB 10.0 (L) 05/08/2020   HCT 32.9 (L) 05/08/2020   MCV 83.5 05/08/2020   MCH 25.4 (L) 05/08/2020   RDW 19.0 (H) 05/08/2020   PLT 318 05/08/2020   Last metabolic panel Lab Results  Component Value Date   GLUCOSE 90 01/19/2023   NA 138 01/19/2023   K 3.7 01/19/2023   CL 101 01/19/2023   CO2 27 01/19/2023   BUN 16 01/19/2023   CREATININE 0.88 01/19/2023   EGFR 85 01/19/2023   CALCIUM 9.1 01/19/2023   PROT 7.2 09/17/2021   ALBUMIN 4.1 03/23/2021   BILITOT 0.5 09/17/2021   ALKPHOS 71 03/23/2021   AST 15 09/17/2021   ALT 13 09/17/2021   ANIONGAP 9 05/08/2020   Last  lipids No results found for: CHOL, HDL, LDLCALC, LDLDIRECT, TRIG, CHOLHDL Last hemoglobin A1c Lab Results  Component Value Date   HGBA1C 5.7 03/23/2021   Last thyroid  functions Lab Results  Component Value Date   TSH 0.97 03/23/2021   Last vitamin D  Lab Results  Component Value Date   VD25OH 10.5 (L) 02/16/2023   Last vitamin B12 and Folate Lab Results  Component Value Date   VITAMINB12 698 02/16/2023   FOLATE 11.4 04/28/2014        Assessment & Plan:    Routine Health Maintenance and Physical Exam  Immunization History  Administered Date(s) Administered   Hepatitis B, ADULT 11/02/1999, 12/27/1999   MMR 11/02/1999   PFIZER(Purple Top)SARS-COV-2 Vaccination 08/29/2019, 09/24/2019, 06/13/2020, 03/29/2021   Td 11/02/1999    Health Maintenance  Topic Date Due   Hepatitis B Vaccines (3 of 3 - 3-dose series) 02/22/2000   Pneumococcal Vaccine 63-90 Years old (1 of 2 - PCV) Never done   HPV VACCINES (1 - 3-dose SCDM series) Never done   DTaP/Tdap/Td (2 - Tdap) 11/01/2009   COVID-19 Vaccine (5 - 2024-25 season) 02/05/2023   INFLUENZA VACCINE  01/05/2024   Hepatitis C Screening  Completed   HIV Screening  Completed   Meningococcal B Vaccine  Aged Out    Discussed health benefits of physical activity, and encouraged her to engage in regular exercise appropriate for her age and condition.  Problem List  Items Addressed This Visit     Chronic idiopathic constipation   Relevant Medications   linaclotide  (LINZESS ) 72 MCG capsule   GERD (gastroesophageal reflux disease)   Relevant Medications   pantoprazole  (PROTONIX ) 20 MG tablet   linaclotide  (LINZESS ) 72 MCG capsule   Primary hypertension   Relevant Medications   amLODipine  (NORVASC ) 10 MG tablet   hydrochlorothiazide  (MICROZIDE ) 12.5 MG capsule   valsartan  (DIOVAN ) 160 MG tablet   Other Relevant Orders   CBC with Differential/Platelet   Comprehensive metabolic panel with GFR   Seasonal allergies    Relevant Medications   levocetirizine (XYZAL ) 5 MG tablet   montelukast  (SINGULAIR ) 10 MG tablet   Snoring   Relevant Orders   Ambulatory referral to Sleep Studies   Other Visit Diagnoses       Adult wellness visit    -  Primary   Relevant Orders   CBC with Differential/Platelet   Hemoglobin A1c   TSH   Lipid panel   Comprehensive metabolic panel with GFR   T4, free     Vitamin D  deficiency       Relevant Orders   Vitamin D  (25 hydroxy)     Prediabetes       Relevant Orders   Hemoglobin A1c     Other insomnia       Relevant Medications   amitriptyline  (ELAVIL ) 25 MG tablet   Other Relevant Orders   Ambulatory referral to Sleep Studies     Screen for STD (sexually transmitted disease)       Relevant Orders   HIV antibody (with reflex)   Hepatitis C Antibody   RPR   NuSwab Vaginitis Plus (VG+)     Goiter       Relevant Orders   TSH   T4, free      Return in about 8 weeks (around 02/08/2024).  Assessment and Plan    Obesity Weight gain despite dietary changes. Discussed weight loss medications' side effects. Encouraged dietary and activity modifications. - Encourage increased physical activity as feasible. - Advise dietary modifications to reduce carbohydrate intake and increase protein. - Discuss potential weight loss options in follow-up visit. - would like to avoid GLP-1 given hx of stomach surgery and phentermine due to hx of HTN  Hypertension Well-controlled with valsartan , hydrochlorothiazide , and amlodipine . Phentermine contraindicated due to current regimen. - Continue current antihypertensive medications: valsartan , hydrochlorothiazide , and amlodipine .  Asthma New symptoms post-hysterectomy. Inconsistent use of daily controller medication. Airsupra  discontinued due to insurance. - Continue albuterol  as needed. - Use Qvar  as the daily controller medication. - Discontinue Airsupra . - continue care with allergist  Sacroiliitis Intermittent hip pain  managed with naproxen  and cyclobenzaprine . Caution with naproxen  due to GI side effects. - Use naproxen  and cyclobenzaprine  as needed for pain management. - Caution advised with naproxen  use due to potential gastrointestinal side effects.  Insomnia Difficulty sleeping, possibly related to past depression and anxiety. Trazodone  ineffective. Elavil  discussed for insomnia and mood. - Prescribe Elavil  for insomnia and potential mood stabilization. - Consider sleep study if symptoms persist.  Post-hysterectomy symptoms Hormonal changes and symptoms post-hysterectomy. Thyroid  enlargement noted. - Monitor symptoms and consider hormonal evaluation if symptoms persist. - Perform comprehensive blood panel including thyroid  function tests.  General Health Maintenance Due for vaccinations and screenings. Interested in STD screening. - Offered tetanus and hepatitis B vaccines, deferred today. - Perform STD screening including HIV and gonorrhea/chlamydia tests per pt request - Check vitamin D  levels.  Follow-up Follow-up needed for  blood work review and Marketing executive. - Schedule follow-up appointment in 6-8 weeks to review blood work and discuss weight loss options. - Consider sleep study, referral placed today         Benton LITTIE Gave, PA

## 2023-12-15 ENCOUNTER — Ambulatory Visit: Payer: Self-pay | Admitting: Urgent Care

## 2023-12-15 DIAGNOSIS — B9689 Other specified bacterial agents as the cause of diseases classified elsewhere: Secondary | ICD-10-CM

## 2023-12-15 DIAGNOSIS — E559 Vitamin D deficiency, unspecified: Secondary | ICD-10-CM

## 2023-12-15 LAB — COMPREHENSIVE METABOLIC PANEL WITH GFR
ALT: 14 IU/L (ref 0–32)
AST: 16 IU/L (ref 0–40)
Albumin: 4 g/dL (ref 3.9–4.9)
Alkaline Phosphatase: 71 IU/L (ref 44–121)
BUN/Creatinine Ratio: 12 (ref 9–23)
BUN: 11 mg/dL (ref 6–24)
Bilirubin Total: 0.6 mg/dL (ref 0.0–1.2)
CO2: 20 mmol/L (ref 20–29)
Calcium: 9.1 mg/dL (ref 8.7–10.2)
Chloride: 102 mmol/L (ref 96–106)
Creatinine, Ser: 0.91 mg/dL (ref 0.57–1.00)
Globulin, Total: 2.9 g/dL (ref 1.5–4.5)
Glucose: 86 mg/dL (ref 70–99)
Potassium: 4 mmol/L (ref 3.5–5.2)
Sodium: 139 mmol/L (ref 134–144)
Total Protein: 6.9 g/dL (ref 6.0–8.5)
eGFR: 81 mL/min/1.73 (ref >59)

## 2023-12-15 LAB — CBC WITH DIFFERENTIAL/PLATELET
Basophils Absolute: 0 x10E3/uL (ref 0.0–0.2)
Basos: 1 %
EOS (ABSOLUTE): 0.2 x10E3/uL (ref 0.0–0.4)
Eos: 3 %
Hematocrit: 43.7 % (ref 34.0–46.6)
Hemoglobin: 13.6 g/dL (ref 11.1–15.9)
Immature Grans (Abs): 0 x10E3/uL (ref 0.0–0.1)
Immature Granulocytes: 0 %
Lymphocytes Absolute: 2.1 x10E3/uL (ref 0.7–3.1)
Lymphs: 33 %
MCH: 28.7 pg (ref 26.6–33.0)
MCHC: 31.1 g/dL — ABNORMAL LOW (ref 31.5–35.7)
MCV: 92 fL (ref 79–97)
Monocytes Absolute: 0.5 x10E3/uL (ref 0.1–0.9)
Monocytes: 8 %
Neutrophils Absolute: 3.5 x10E3/uL (ref 1.4–7.0)
Neutrophils: 55 %
Platelets: 379 x10E3/uL (ref 150–450)
RBC: 4.74 x10E6/uL (ref 3.77–5.28)
RDW: 12.9 % (ref 11.7–15.4)
WBC: 6.4 x10E3/uL (ref 3.4–10.8)

## 2023-12-15 LAB — LIPID PANEL
Chol/HDL Ratio: 3.7 ratio (ref 0.0–4.4)
Cholesterol, Total: 164 mg/dL (ref 100–199)
HDL: 44 mg/dL (ref >39)
LDL Chol Calc (NIH): 108 mg/dL — ABNORMAL HIGH (ref 0–99)
Triglycerides: 62 mg/dL (ref 0–149)
VLDL Cholesterol Cal: 12 mg/dL (ref 5–40)

## 2023-12-15 LAB — T4, FREE: Free T4: 1.14 ng/dL (ref 0.82–1.77)

## 2023-12-15 LAB — HEMOGLOBIN A1C
Est. average glucose Bld gHb Est-mCnc: 105 mg/dL
Hgb A1c MFr Bld: 5.3 % (ref 4.8–5.6)

## 2023-12-15 LAB — VITAMIN D 25 HYDROXY (VIT D DEFICIENCY, FRACTURES): Vit D, 25-Hydroxy: 14.9 ng/mL — ABNORMAL LOW (ref 30.0–100.0)

## 2023-12-15 LAB — RPR: RPR Ser Ql: NONREACTIVE

## 2023-12-15 LAB — HEPATITIS C ANTIBODY: Hep C Virus Ab: NONREACTIVE

## 2023-12-15 LAB — TSH: TSH: 1.68 u[IU]/mL (ref 0.450–4.500)

## 2023-12-15 LAB — HIV ANTIBODY (ROUTINE TESTING W REFLEX): HIV Screen 4th Generation wRfx: NONREACTIVE

## 2023-12-15 MED ORDER — VITAMIN D (ERGOCALCIFEROL) 1.25 MG (50000 UNIT) PO CAPS: 50000.0000 [IU] | ORAL_CAPSULE | ORAL | 0 refills | Status: AC

## 2023-12-17 LAB — NUSWAB VAGINITIS PLUS (VG+)
Atopobium vaginae: HIGH {score} — AB
BVAB 2: HIGH {score} — AB
Candida albicans, NAA: NEGATIVE
Candida glabrata, NAA: NEGATIVE
Megasphaera 1: HIGH {score} — AB

## 2023-12-17 MED ORDER — METRONIDAZOLE 500 MG PO TABS: 500.0000 mg | ORAL_TABLET | Freq: Two times a day (BID) | ORAL | 0 refills | Status: AC

## 2023-12-18 ENCOUNTER — Encounter (HOSPITAL_BASED_OUTPATIENT_CLINIC_OR_DEPARTMENT_OTHER): Payer: Self-pay | Admitting: Emergency Medicine

## 2023-12-18 ENCOUNTER — Emergency Department (HOSPITAL_BASED_OUTPATIENT_CLINIC_OR_DEPARTMENT_OTHER)

## 2023-12-18 ENCOUNTER — Emergency Department (HOSPITAL_BASED_OUTPATIENT_CLINIC_OR_DEPARTMENT_OTHER)
Admission: EM | Admit: 2023-12-18 | Discharge: 2023-12-18 | Disposition: A | Discharge status: Home / Self Care | Attending: Emergency Medicine | Admitting: Emergency Medicine

## 2023-12-18 ENCOUNTER — Other Ambulatory Visit: Payer: Self-pay

## 2023-12-18 DIAGNOSIS — R519 Headache, unspecified: Secondary | ICD-10-CM | POA: Diagnosis present

## 2023-12-18 DIAGNOSIS — Y92410 Unspecified street and highway as the place of occurrence of the external cause: Secondary | ICD-10-CM | POA: Diagnosis not present

## 2023-12-18 DIAGNOSIS — Z91040 Latex allergy status: Secondary | ICD-10-CM | POA: Insufficient documentation

## 2023-12-18 DIAGNOSIS — Z79899 Other long term (current) drug therapy: Secondary | ICD-10-CM | POA: Diagnosis not present

## 2023-12-18 DIAGNOSIS — M545 Low back pain, unspecified: Secondary | ICD-10-CM | POA: Insufficient documentation

## 2023-12-18 DIAGNOSIS — I10 Essential (primary) hypertension: Secondary | ICD-10-CM | POA: Insufficient documentation

## 2023-12-18 MED ORDER — NAPROXEN 375 MG PO TABS: 375.0000 mg | ORAL_TABLET | Freq: Two times a day (BID) | ORAL | 0 refills | Status: AC

## 2023-12-18 MED ORDER — CYCLOBENZAPRINE HCL 10 MG PO TABS
10.0000 mg | ORAL_TABLET | Freq: Two times a day (BID) | ORAL | 0 refills | Status: AC | PRN
Start: 2023-12-18 — End: ?

## 2023-12-18 MED ORDER — ACETAMINOPHEN 325 MG PO TABS
650.0000 mg | ORAL_TABLET | Freq: Once | ORAL | Status: AC
  Administered 2023-12-18: 650 mg via ORAL
  Filled 2023-12-18: qty 2
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
## 2023-12-20 ENCOUNTER — Ambulatory Visit (INDEPENDENT_AMBULATORY_CARE_PROVIDER_SITE_OTHER)

## 2023-12-20 DIAGNOSIS — J309 Allergic rhinitis, unspecified: Secondary | ICD-10-CM | POA: Diagnosis not present

## 2024-01-08 ENCOUNTER — Ambulatory Visit (INDEPENDENT_AMBULATORY_CARE_PROVIDER_SITE_OTHER)

## 2024-01-08 DIAGNOSIS — J309 Allergic rhinitis, unspecified: Secondary | ICD-10-CM | POA: Diagnosis not present

## 2024-01-15 ENCOUNTER — Ambulatory Visit (INDEPENDENT_AMBULATORY_CARE_PROVIDER_SITE_OTHER): Payer: 59 | Admitting: Internal Medicine

## 2024-01-15 ENCOUNTER — Encounter: Payer: Self-pay | Admitting: Internal Medicine

## 2024-01-15 VITALS — BP 122/84 | HR 79 | Temp 97.5°F | Resp 20 | Ht 62.0 in | Wt 221.8 lb

## 2024-01-15 DIAGNOSIS — J309 Allergic rhinitis, unspecified: Secondary | ICD-10-CM

## 2024-01-15 DIAGNOSIS — J4530 Mild persistent asthma, uncomplicated: Secondary | ICD-10-CM | POA: Diagnosis not present

## 2024-01-15 DIAGNOSIS — J302 Other seasonal allergic rhinitis: Secondary | ICD-10-CM

## 2024-01-15 DIAGNOSIS — H1045 Other chronic allergic conjunctivitis: Secondary | ICD-10-CM | POA: Diagnosis not present

## 2024-01-15 DIAGNOSIS — J3089 Other allergic rhinitis: Secondary | ICD-10-CM

## 2024-01-15 DIAGNOSIS — K219 Gastro-esophageal reflux disease without esophagitis: Secondary | ICD-10-CM | POA: Diagnosis not present

## 2024-01-15 DIAGNOSIS — L2389 Allergic contact dermatitis due to other agents: Secondary | ICD-10-CM

## 2024-01-15 MED ORDER — AZELASTINE HCL 0.1 % NA SOLN: 2.0000 | Freq: Two times a day (BID) | NASAL | 5 refills | Status: AC

## 2024-01-15 MED ORDER — QVAR REDIHALER 40 MCG/ACT IN AERB: 2.0000 | INHALATION_SPRAY | Freq: Two times a day (BID) | RESPIRATORY_TRACT | 5 refills | Status: AC

## 2024-01-15 NOTE — Patient Instructions (Addendum)
 Allergic  Rhinitis on AIT  - Allergy  test: positive to grass, weed, tree, mold, tobacoo leaf  - Continue avoidance of irritant and allergic triggers  - Continue  Flonase  1 spray per nostril twice daily.  Aim upward and outward - Continue Astelin  2 sprays into both nostrils twice daily - Continue Xyzal  5 mg daily - Continue Allergy  injections per protocol   Asthma: mild persistent  Breathing test: looked good  - During respiratory illness or asthma flares: Start Qvar  40mcg 2 puffs  and continue for 2 weeks or until symptoms resolve. - Rescue Inhaler: Airsupra  2 puffs. Use  every 4-6 hours as needed for chest tightness, wheezing, or coughing.  Can also use 15 minutes prior to exercise if you have symptoms with activity. - Asthma is not controlled if:  - Symptoms are occurring >2 times a week OR  - >2 times a month nighttime awakenings  - You are requiring systemic steroids (prednisone /steroid injections) more than once per year  - Your require hospitalization for your asthma.  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 Symptoms resolved with avoidance. -Continue avoidance of acrylates, particularly nail glue.  Gastroesophageal Reflux Disease (GERD) Controlled with medication, occasional breakthrough symptoms with certain foods. -Continue pantoprazole  20 mg daily -Advise on diet control to prevent reflux-induced respiratory symptoms.  Follow up: in clinic in 12 months   Thank you so much for letting me partake in your care today.  Don't hesitate to reach out if you have any additional concerns!  Hargis Springer, MD  [facility] [place] [place]
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
## 2024-01-22 ENCOUNTER — Encounter: Admitting: Family Medicine

## 2024-01-23 ENCOUNTER — Institutional Professional Consult (permissible substitution): Admitting: Neurology

## 2024-02-07 ENCOUNTER — Ambulatory Visit (INDEPENDENT_AMBULATORY_CARE_PROVIDER_SITE_OTHER)

## 2024-02-07 DIAGNOSIS — J309 Allergic rhinitis, unspecified: Secondary | ICD-10-CM | POA: Diagnosis not present

## 2024-02-08 ENCOUNTER — Encounter: Payer: Self-pay | Admitting: Urgent Care

## 2024-02-08 ENCOUNTER — Ambulatory Visit: Admitting: Urgent Care

## 2024-02-08 VITALS — BP 121/82 | HR 78 | Resp 20 | Ht 62.0 in | Wt 217.0 lb

## 2024-02-08 DIAGNOSIS — I10 Essential (primary) hypertension: Secondary | ICD-10-CM | POA: Diagnosis not present

## 2024-02-08 DIAGNOSIS — G4709 Other insomnia: Secondary | ICD-10-CM

## 2024-02-08 DIAGNOSIS — F0781 Postconcussional syndrome: Secondary | ICD-10-CM | POA: Diagnosis not present

## 2024-02-08 MED ORDER — AMITRIPTYLINE HCL 50 MG PO TABS: 50.0000 mg | ORAL_TABLET | Freq: Every day | ORAL | 3 refills | Status: AC
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
## 2024-02-08 NOTE — Patient Instructions (Signed)
 Please increase your elavil  to 50mg  nightly. Monitor your sleep quality.  Please monitor your weight at home. If you desire further intervention, please let me know. Incorporate physical activity throughout your day. More muscle mass usually increases your metabolism, thus helping with weight loss.  Take the Rx Vit D once weekly x 12 weeks.  Return for your annual PE in July 2026, sooner as needed

## 2024-02-13 ENCOUNTER — Ambulatory Visit: Admitting: Neurology

## 2024-02-13 ENCOUNTER — Encounter: Payer: Self-pay | Admitting: Neurology

## 2024-02-13 VITALS — BP 130/89 | HR 74 | Ht 62.0 in | Wt 217.0 lb

## 2024-02-13 DIAGNOSIS — Z9189 Other specified personal risk factors, not elsewhere classified: Secondary | ICD-10-CM

## 2024-02-13 DIAGNOSIS — R0683 Snoring: Secondary | ICD-10-CM

## 2024-02-13 DIAGNOSIS — R519 Headache, unspecified: Secondary | ICD-10-CM

## 2024-02-13 DIAGNOSIS — E669 Obesity, unspecified: Secondary | ICD-10-CM

## 2024-02-13 DIAGNOSIS — G4719 Other hypersomnia: Secondary | ICD-10-CM

## 2024-02-13 DIAGNOSIS — Z820 Family history of epilepsy and other diseases of the nervous system: Secondary | ICD-10-CM

## 2024-02-13 NOTE — Progress Notes (Signed)
 Subjective:    Patient ID: Traci Frank is a 43 y.o. female.  HPI    True Mar, MD, PhD [facility] [address] [address] [place], [place] [address]  Dear Benton,   I saw your patient, Traci Frank, upon your kind request in my sleep clinic today for initial consultation of her sleep disorder, in particular, concern for underlying obstructive sleep apnea.  The patient is unaccompanied today.  As you know, Traci Frank is a 43 year old female with an underlying medical history of anemia, allergies, asthma, anxiety, depression, reflux disease, hypertension, headaches, uterine fibroid, vertigo, prediabetes, vitamin D  deficiency, chronic constipation, and obesity, who reports snoring and excessive daytime somnolence.  Her Epworth sleepiness score is 9 out of 24, fatigue severity score is 21 out of 63 to July 15.  I reviewed your office note from 12/14/2023.  She was started on amitriptyline  at the time.  She feels that the amitriptyline  has been helpful so far.  She has a family history of sleep apnea affecting her paternal grandmother.  Her maternal grandmother had narcolepsy.  Patient is single, she lives with her cousin.  She has no children.  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  She goes to bed around 11, rise time is between 6:30 AM and 9 AM, depending on her work schedule.  She does not watch TV in her bedroom at night but does have a TV in the bedroom.  She does not have any pets.  She is working on weight loss.  She drinks caffeine in the form of tea or soda or coffee, usually up to 2 servings per day.  She is a non-smoker.  She does not drink any alcohol.  She has no nightly nocturia but has had occasional morning headaches, she has a history of teeth clenching or grinding.  She does not use a bite guard.  Her Past Medical History Is Significant For: Past Medical History:  Diagnosis Date   Abnormal involuntary movements(781.0)    Right  hand   Allergy     Anemia    Anxiety    Asthma    Depression    Disturbance of skin sensation    Right arm   GERD (gastroesophageal reflux disease)    Headache(784.0)    Hypertension    Tingling sensation 05/27/2013   Tremor of unknown origin 05/27/2013   Uterine fibroid    Vertigo     Her Past Surgical History Is Significant For: Past Surgical History:  Procedure Laterality Date   HERNIA REPAIR     HYSTERECTOMY ABDOMINAL WITH SALPINGECTOMY Bilateral 05/07/2020   Procedure: EXPLORATORY LAPAROTOMY,TOTAL HYSTERECTOMY ABDOMINAL WITH BILATERAL  SALPINGECTOMY GREATER THAN 250GM;  Surgeon: Eloy Herring, MD;  Location: [facility];  Service: Gynecology;  Laterality: Bilateral;   MYOMECTOMY     OMENTECTOMY N/A 05/07/2020   Procedure: OMENTECTOMY, RADICAL TUMOR DEBULKING;  Surgeon: Eloy Herring, MD;  Location: [facility];  Service: Gynecology;  Laterality: N/A;   TOTAL ABDOMINAL HYSTERECTOMY Bilateral 05/07/2020    Her Family History Is Significant For: Family History  Problem Relation Age of Onset   Hypertension Mother    Arthritis Mother    Varicose Veins Mother    Drug abuse Father    Diabetes Maternal Grandmother    Asthma Maternal Grandmother    Obesity Maternal Grandmother    Narcolepsy Maternal Grandmother    Diabetes Maternal Grandfather    COPD Paternal Grandmother    Obesity Paternal Grandmother  Stroke Paternal Grandfather    Breast cancer Maternal Aunt    Asthma Maternal Aunt    Cancer Maternal Aunt    COPD Maternal Aunt    Breast cancer Paternal Aunt    Alcohol abuse Paternal Aunt    Cancer Paternal Aunt    Bipolar disorder Cousin    Sleep apnea Neg Hx     Her Social History Is Significant For: Social History   Socioeconomic History   Marital status: Single    Spouse name: Not on file   Number of children: Not on file   Years of education: Not on file   Highest education level: Bachelor's degree (e.g., BA, AB, BS)  Occupational History   Occupation: call  center rep    Employer: [employer]  Tobacco Use   Smoking status: Never    Passive exposure: Never   Smokeless tobacco: Never  Vaping Use   Vaping status: Never Used  Substance and Sexual Activity   Alcohol use: Not Currently   Drug use: No   Sexual activity: Yes    Partners: Male    Birth control/protection: Surgical  Other Topics Concern   Not on file  Social History Narrative   Caffeine: 1-2 cups/day   Right handed   Lives with cousin   Social Drivers of Health   Financial Resource Strain: Low Risk  (12/14/2023)   Overall Financial Resource Strain (CARDIA)    Difficulty of Paying Living Expenses: Not hard at all  Food Insecurity: No Food Insecurity (12/14/2023)   Hunger Vital Sign    Worried About Running Out of Food in the Last Year: Never true    Ran Out of Food in the Last Year: Never true  Transportation Needs: No Transportation Needs (12/14/2023)   PRAPARE - Administrator, Civil Service (Medical): No    Lack of Transportation (Non-Medical): No  Physical Activity: Inactive (12/14/2023)   Exercise Vital Sign    Days of Exercise per Week: 0 days    Minutes of Exercise per Session: Not on file  Stress: Stress Concern Present (12/14/2023)   [employer] of Occupational Health - Occupational Stress Questionnaire    Feeling of Stress: Rather much  Social Connections: Moderately Isolated (12/14/2023)   Social Connection and Isolation Panel    Frequency of Communication with Friends and Family: Three times a week    Frequency of Social Gatherings with Friends and Family: Once a week    Attends Religious Services: More than 4 times per year    Active Member of [employer] or Organizations: No    Attends Engineer, structural: Not on file    Marital Status: Never married    Her Allergies Are:  Allergies  Allergen Reactions   Latex Itching  :   Her Current Medications Are:  Outpatient Encounter Medications as of 02/13/2024  Medication Sig   albuterol   (VENTOLIN  HFA) 108 (90 Base) MCG/ACT inhaler Inhale 2 puffs into the lungs every 4 (four) hours as needed for wheezing or shortness of breath.   amitriptyline  (ELAVIL ) 50 MG tablet Take 1 tablet (50 mg total) by mouth at bedtime. One half tab PO qHS for a week, then one tab PO qHS.   amLODipine  (NORVASC ) 10 MG tablet Take 1 tablet (10 mg total) by mouth daily.   azelastine  (ASTELIN ) 0.1 % nasal spray Place 2 sprays into both nostrils 2 (two) times daily. Use in each nostril as directed   beclomethasone (QVAR  REDIHALER) 40 MCG/ACT inhaler  Inhale 2 puffs into the lungs 2 (two) times daily.   cyclobenzaprine  (FLEXERIL ) 10 MG tablet Take 1 tablet (10 mg total) by mouth 2 (two) times daily as needed for muscle spasms.   EPINEPHrine  0.3 mg/0.3 mL IJ SOAJ injection Inject 0.3 mg into the muscle as needed for anaphylaxis.   fluticasone  (FLONASE ) 50 MCG/ACT nasal spray Place 1 spray into both nostrils daily.   hydrochlorothiazide  (MICROZIDE ) 12.5 MG capsule Take 1 capsule (12.5 mg total) by mouth daily.   ipratropium (ATROVENT ) 0.03 % nasal spray Place 2 sprays into both nostrils 2 (two) times daily.   levocetirizine (XYZAL ) 5 MG tablet Take 1 tablet (5 mg total) by mouth every evening.   linaclotide  (LINZESS ) 72 MCG capsule Take 1 capsule (72 mcg total) by mouth daily before breakfast.   montelukast  (SINGULAIR ) 10 MG tablet Take 1 tablet (10 mg total) by mouth at bedtime.   Multiple Vitamin (THERA) TABS Take 1 tablet by mouth daily.   naproxen  (NAPROSYN ) 375 MG tablet Take 1 tablet (375 mg total) by mouth 2 (two) times daily.   pantoprazole  (PROTONIX ) 20 MG tablet Take 1 tablet (20 mg total) by mouth daily.   valsartan  (DIOVAN ) 160 MG tablet Take 1 tablet (160 mg total) by mouth daily.   Vitamin D , Ergocalciferol , (DRISDOL ) 1.25 MG (50000 UNIT) CAPS capsule Take 1 capsule (50,000 Units total) by mouth every 7 (seven) days.   famotidine  (PEPCID ) 20 MG tablet Take 1 tabs Thursday and Friday twice daily  before [facility] appt. (Patient not taking: Reported on 02/13/2024)   fluticasone  (FLOVENT  HFA) 44 MCG/ACT inhaler Inhale 2 puffs into the lungs 2 (two) times daily. (Patient not taking: Reported on 02/13/2024)   No facility-administered encounter medications on file as of 02/13/2024.  :   Review of Systems:  Out of a complete 14 point review of systems, all are reviewed and negative with the exception of these symptoms as listed below:  Review of Systems  Neurological:        Patient is here alone for sleep consult. Patient states she snores a lot and is tired all day long. Grandmother has narcolepsy. ESS 9 FSS 21    Objective:  Neurological Exam  Physical Exam Physical Examination:   Vitals:   02/13/24 1252  BP: 130/89  Pulse: 74    General Examination: The patient is a very pleasant 43 y.o. female in no acute distress. She appears well-developed and well-nourished and well groomed.   HEENT: Normocephalic, atraumatic, pupils are equal, round and reactive to light, extraocular tracking is good without limitation to gaze excursion or nystagmus noted. Hearing is grossly intact. Face is symmetric with normal facial animation. Speech is clear with no dysarthria noted. There is no hypophonia. There is no lip, neck/head, jaw or voice tremor. Neck is supple with full range of passive and active motion. There are no carotid bruits on auscultation. Oropharynx exam reveals: mild mouth dryness, good dental hygiene and mild airway crowding, due to small airway entry, slightly wider tongue, normal uvula, tonsils on the smaller side, Mallampati class II, neck circumference 15-1/4 inches, mild overbite noted.  Tongue protrudes centrally and palate elevates symmetrically.  Chest: Clear to auscultation without wheezing, rhonchi or crackles noted.  Heart: S1+S2+0, regular and normal without murmurs, rubs or gallops noted.   Abdomen: Soft, non-tender and non-distended.  Extremities: There is no pitting edema  in the distal lower extremities bilaterally.   Skin: Warm and dry without trophic changes noted.   Musculoskeletal: exam  reveals no obvious joint deformities.   Neurologically:  Mental status: The patient is awake, alert and oriented in all 4 spheres. Her immediate and remote memory, attention, language skills and fund of knowledge are appropriate. There is no evidence of aphasia, agnosia, apraxia or anomia. Speech is clear with normal prosody and enunciation. Thought process is linear. Mood is normal and affect is normal.  Cranial nerves II - XII are as described above under HEENT exam.  Motor exam: Normal bulk, strength and tone is noted. There is no obvious action or resting tremor.  Fine motor skills and coordination: grossly intact.  Cerebellar testing: No dysmetria or intention tremor. There is no truncal or gait ataxia.  Sensory exam: intact to light touch in the upper and lower extremities.  Gait, station and balance: She stands easily. No veering to one side is noted. No leaning to one side is noted. Posture is age-appropriate and stance is narrow based. Gait shows normal stride length and normal pace. No problems turning are noted.   Assessment and Plan:  In summary, Traci Frank is a very pleasant 43 y.o.-year old female with an underlying medical history of anemia, allergies, asthma, anxiety, depression, reflux disease, hypertension, headaches, uterine fibroid, vertigo, prediabetes, vitamin D  deficiency, chronic constipation, and obesity, whose history and physical exam are concerning for sleep disordered breathing, particularly obstructive sleep apnea (OSA). A laboratory attended sleep study is typically considered gold standard for evaluation of sleep disordered breathing.   I had a long chat with the patient about my findings and the diagnosis of sleep apnea, particularly OSA, its prognosis and treatment options. We talked about medical/conservative treatments, surgical  interventions and non-pharmacological approaches for symptom control. I explained, in particular, the risks and ramifications of untreated moderate to severe OSA, especially with respect to developing cardiovascular disease down the road, including congestive heart failure (CHF), difficult to treat hypertension, cardiac arrhythmias (particularly A-fib), neurovascular complications including TIA, stroke and dementia. Even type 2 diabetes has, in part, been linked to untreated OSA. Symptoms of untreated OSA may include (but may not be limited to) daytime sleepiness, nocturia (i.e. frequent nighttime urination), memory problems, mood irritability and suboptimally controlled or worsening mood disorder such as depression and/or anxiety, lack of energy, lack of motivation, physical discomfort, as well as recurrent headaches, especially morning or nocturnal headaches. We talked about the importance of maintaining a healthy lifestyle and striving for healthy weight. In addition, we talked about the importance of striving for and maintaining good sleep hygiene. I recommended a sleep study at this time. I outlined the differences between a laboratory attended sleep study which is considered more comprehensive and accurate over the option of a home sleep test (HST); the latter may lead to underestimation of sleep disordered breathing in some instances and does not help with diagnosing upper airway resistance syndrome and is not accurate enough to diagnose primary central sleep apnea typically. I outlined possible surgical and non-surgical treatment options of OSA, including the use of a positive airway pressure (PAP) device (i.e. CPAP, AutoPAP/APAP or BiPAP in certain circumstances), a custom-made dental device (aka oral appliance, which would require a referral to a specialist dentist or orthodontist typically, and is generally speaking not considered for patients with full dentures or edentulous state), upper airway  surgical options, such as traditional UPPP (which is not considered a first-line treatment) or the Inspire device (hypoglossal nerve stimulator, which would involve a referral for consultation with an ENT surgeon, after careful selection, following inclusion criteria -  also not first-line treatment). I explained the PAP treatment option to the patient in detail, as this is generally considered first-line treatment.  The patient indicated that she would be willing to try PAP therapy, if the need arises. I explained the importance of being compliant with PAP treatment, not only for insurance purposes but primarily to improve patient's symptoms symptoms, and for the patient's long term health benefit, including to reduce Her cardiovascular risks longer-term.    We will pick up our discussion about the next steps and treatment options after testing.  We will keep her  posted as to the test results by phone call and/or MyChart messaging where possible.  We will plan to follow-up in sleep clinic accordingly as well.  I answered all her questions today and the patient was in agreement.   I encouraged her to call with any interim questions, concerns, problems or updates or email us  through MyChart.  Generally speaking, sleep test authorizations may take up to 2 weeks, sometimes less, sometimes longer, the patient is encouraged to get in touch with us  if they do not hear back from the sleep lab staff directly within the next 2 weeks.  Thank you very much for allowing me to participate in the care of this nice patient. If I can be of any further assistance to you please do not hesitate to call me at [phone].  Sincerely,   True Mar, MD, PhD

## 2024-02-13 NOTE — Patient Instructions (Signed)

## 2024-02-16 ENCOUNTER — Telehealth: Payer: Self-pay | Admitting: Neurology

## 2024-02-16 NOTE — Telephone Encounter (Signed)
 NPSG [employer] pending

## 2024-02-19 NOTE — Telephone Encounter (Signed)
 HST [employer] no auth req NPSG denied  see below for denial reason.

## 2024-02-22 ENCOUNTER — Ambulatory Visit
Admission: EM | Admit: 2024-02-22 | Discharge: 2024-02-22 | Disposition: A | Discharge status: Home / Self Care | Attending: Family Medicine | Admitting: Family Medicine

## 2024-02-22 DIAGNOSIS — J029 Acute pharyngitis, unspecified: Secondary | ICD-10-CM | POA: Insufficient documentation

## 2024-02-22 DIAGNOSIS — R070 Pain in throat: Secondary | ICD-10-CM | POA: Diagnosis present

## 2024-02-22 DIAGNOSIS — J309 Allergic rhinitis, unspecified: Secondary | ICD-10-CM | POA: Diagnosis present

## 2024-02-22 LAB — POCT RAPID STREP A (OFFICE): Rapid Strep A Screen: NEGATIVE

## 2024-02-22 MED ORDER — PREDNISONE 10 MG PO TABS: 30.0000 mg | ORAL_TABLET | Freq: Every day | ORAL | 0 refills | Status: DC

## 2024-02-22 NOTE — ED Provider Notes (Signed)
 [facility]  Note:  This document was prepared using Conservation officer, historic buildings and may include unintentional dictation errors.  MRN: [id number] DOB: Apr 22, 1981  Subjective:   Traci Frank is a 43 y.o. female presenting for 1 day history of throat pain radiating into the the right ear. No fever, runny or stuffy nose, cough, chest pain, shob. 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  Patient is supposed to get immunotherapy for her allergies but has missed the past 2 weeks.  No current facility-administered medications for this encounter.  Current Outpatient Medications:    albuterol  (VENTOLIN  HFA) 108 (90 Base) MCG/ACT inhaler, Inhale 2 puffs into the lungs every 4 (four) hours as needed for wheezing or shortness of breath., Disp: 8 g, Rfl: 2   amitriptyline  (ELAVIL ) 50 MG tablet, Take 1 tablet (50 mg total) by mouth at bedtime. One half tab PO qHS for a week, then one tab PO qHS., Disp: 90 tablet, Rfl: 3   amLODipine  (NORVASC ) 10 MG tablet, Take 1 tablet (10 mg total) by mouth daily., Disp: 90 tablet, Rfl: 2   azelastine  (ASTELIN ) 0.1 % nasal spray, Place 2 sprays into both nostrils 2 (two) times daily. Use in each nostril as directed, Disp: 30 mL, Rfl: 5   beclomethasone (QVAR  REDIHALER) 40 MCG/ACT inhaler, Inhale 2 puffs into the lungs 2 (two) times daily., Disp: 1 each, Rfl: 5   cyclobenzaprine  (FLEXERIL ) 10 MG tablet, Take 1 tablet (10 mg total) by mouth 2 (two) times daily as needed for muscle spasms., Disp: 20 tablet, Rfl: 0   EPINEPHrine  0.3 mg/0.3 mL IJ SOAJ injection, Inject 0.3 mg into the muscle as needed for anaphylaxis., Disp: 1 each, Rfl: 1   famotidine  (PEPCID ) 20 MG tablet, Take 1 tabs Thursday and Friday twice daily before [facility] appt. (Patient not taking: No sig reported), Disp: 4 tablet, Rfl: 0   fluticasone  (FLONASE ) 50 MCG/ACT nasal spray, Place 1 spray into both nostrils daily., Disp: 16 g, Rfl: 2   fluticasone  (FLOVENT  HFA) 44 MCG/ACT  inhaler, Inhale 2 puffs into the lungs 2 (two) times daily. (Patient not taking: Reported on 02/13/2024), Disp: 10.6 each, Rfl: 5   hydrochlorothiazide  (MICROZIDE ) 12.5 MG capsule, Take 1 capsule (12.5 mg total) by mouth daily., Disp: 90 capsule, Rfl: 1   ipratropium (ATROVENT ) 0.03 % nasal spray, Place 2 sprays into both nostrils 2 (two) times daily., Disp: 30 mL, Rfl: 5   levocetirizine (XYZAL ) 5 MG tablet, Take 1 tablet (5 mg total) by mouth every evening., Disp: 30 tablet, Rfl: 2   linaclotide  (LINZESS ) 72 MCG capsule, Take 1 capsule (72 mcg total) by mouth daily before breakfast., Disp: 90 capsule, Rfl: 2   montelukast  (SINGULAIR ) 10 MG tablet, Take 1 tablet (10 mg total) by mouth at bedtime., Disp: 90 tablet, Rfl: 1   Multiple Vitamin (THERA) TABS, Take 1 tablet by mouth daily., Disp: , Rfl:    naproxen  (NAPROSYN ) 375 MG tablet, Take 1 tablet (375 mg total) by mouth 2 (two) times daily., Disp: 20 tablet, Rfl: 0   pantoprazole  (PROTONIX ) 20 MG tablet, Take 1 tablet (20 mg total) by mouth daily., Disp: 30 tablet, Rfl: 5   valsartan  (DIOVAN ) 160 MG tablet, Take 1 tablet (160 mg total) by mouth daily., Disp: 90 tablet, Rfl: 1   Vitamin D , Ergocalciferol , (DRISDOL ) 1.25 MG (50000 UNIT) CAPS capsule, Take 1 capsule (50,000 Units total) by mouth every 7 (seven) days., Disp: 12 capsule, Rfl: 0   Allergies  Allergen Reactions   Latex Itching    Past Medical History:  Diagnosis Date   Abnormal involuntary movements(781.0)    Right hand   Allergy     Anemia    Anxiety    Asthma    Depression    Disturbance of skin sensation    Right arm   GERD (gastroesophageal reflux disease)    Headache(784.0)    Hypertension    Tingling sensation 05/27/2013   Tremor of unknown origin 05/27/2013   Uterine fibroid    Vertigo      Past Surgical History:  Procedure Laterality Date   HERNIA REPAIR     HYSTERECTOMY ABDOMINAL WITH SALPINGECTOMY Bilateral 05/07/2020   Procedure: EXPLORATORY  LAPAROTOMY,TOTAL HYSTERECTOMY ABDOMINAL WITH BILATERAL  SALPINGECTOMY GREATER THAN 250GM;  Surgeon: Eloy Herring, MD;  Location: [facility];  Service: Gynecology;  Laterality: Bilateral;   MYOMECTOMY     OMENTECTOMY N/A 05/07/2020   Procedure: OMENTECTOMY, RADICAL TUMOR DEBULKING;  Surgeon: Eloy Herring, MD;  Location: [facility];  Service: Gynecology;  Laterality: N/A;   TOTAL ABDOMINAL HYSTERECTOMY Bilateral 05/07/2020    Family History  Problem Relation Age of Onset   Hypertension Mother    Arthritis Mother    Varicose Veins Mother    Drug abuse Father    Diabetes Maternal Grandmother    Asthma Maternal Grandmother    Obesity Maternal Grandmother    Narcolepsy Maternal Grandmother    Diabetes Maternal Grandfather    COPD Paternal Grandmother    Obesity Paternal Grandmother    Stroke Paternal Grandfather    Breast cancer Maternal Aunt    Asthma Maternal Aunt    Cancer Maternal Aunt    COPD Maternal Aunt    Breast cancer Paternal Aunt    Alcohol abuse Paternal Aunt    Cancer Paternal Aunt    Bipolar disorder Cousin    Sleep apnea Neg Hx     Social History   Tobacco Use   Smoking status: Never    Passive exposure: Never   Smokeless tobacco: Never  Vaping Use   Vaping status: Never Used  Substance Use Topics   Alcohol use: Not Currently   Drug use: No    ROS   Objective:   Vitals: BP 124/83 (BP Location: Left Arm)   Pulse 98   Temp 99.6 F (37.6 C) (Oral)   Resp 16   LMP 04/13/2020   SpO2 96%   Physical Exam Constitutional:      General: She is not in acute distress.    Appearance: Normal appearance. She is well-developed. She is not ill-appearing, toxic-appearing or diaphoretic.  HENT:     Head: Normocephalic and atraumatic.     Right Ear: Tympanic membrane, ear canal and external ear normal. No tenderness. There is no impacted cerumen. Tympanic membrane is not injected, perforated, erythematous or bulging.     Left Ear: Tympanic membrane, ear canal and  external ear normal. No tenderness. There is no impacted cerumen. Tympanic membrane is not injected, perforated, erythematous or bulging.     Nose: Nose normal.     Mouth/Throat:     Mouth: Mucous membranes are moist.     Pharynx: No pharyngeal swelling, oropharyngeal exudate, posterior oropharyngeal erythema or uvula swelling.     Tonsils: No tonsillar exudate or tonsillar abscesses. 0 on the right. 0 on the left.  Eyes:     General: No scleral icterus.       Right eye: No discharge.  Left eye: No discharge.     Extraocular Movements: Extraocular movements intact.  Cardiovascular:     Rate and Rhythm: Normal rate.  Pulmonary:     Effort: Pulmonary effort is normal.  Skin:    General: Skin is warm and dry.  Neurological:     General: No focal deficit present.     Mental Status: She is alert and oriented to person, place, and time.  Psychiatric:        Mood and Affect: Mood normal.        Behavior: Behavior normal.     Results for orders placed or performed during the hospital encounter of 02/22/24 (from the past 24 hours)  POCT rapid strep A     Status: None   Collection Time: 02/22/24 12:43 PM  Result Value Ref Range   Rapid Strep A Screen Negative Negative    Assessment and Plan :   PDMP not reviewed this encounter.  1. Acute pharyngitis, unspecified etiology   2. Throat pain   3. Allergic rhinitis, unspecified seasonality, unspecified trigger    Recommended patient undergo an oral prednisone  course of 30 mg only.  This would be for acute on chronic allergic rhinitis.  Strep culture pending.  Recommend supportive care otherwise.  Counseled patient on potential for adverse effects with medications prescribed/recommended today, ER and return-to-clinic precautions discussed, patient verbalized understanding.    Christopher Savannah, [place] 02/22/24 1258

## 2024-02-22 NOTE — ED Triage Notes (Signed)
 Pt reports sore throat since last night. 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 Pt has not taken any meds for complaint.

## 2024-02-26 ENCOUNTER — Ambulatory Visit (HOSPITAL_COMMUNITY): Payer: Self-pay

## 2024-02-26 LAB — CULTURE, GROUP A STREP (THRC)

## 2024-03-01 ENCOUNTER — Telehealth: Payer: Self-pay | Admitting: Neurology

## 2024-03-01 ENCOUNTER — Ambulatory Visit (INDEPENDENT_AMBULATORY_CARE_PROVIDER_SITE_OTHER): Admitting: Internal Medicine

## 2024-03-01 ENCOUNTER — Ambulatory Visit: Payer: Self-pay

## 2024-03-01 ENCOUNTER — Encounter: Payer: Self-pay | Admitting: Internal Medicine

## 2024-03-01 ENCOUNTER — Encounter

## 2024-03-01 ENCOUNTER — Other Ambulatory Visit: Payer: Self-pay

## 2024-03-01 VITALS — BP 116/86 | HR 86 | Temp 97.3°F | Resp 18

## 2024-03-01 DIAGNOSIS — J302 Other seasonal allergic rhinitis: Secondary | ICD-10-CM

## 2024-03-01 DIAGNOSIS — J309 Allergic rhinitis, unspecified: Secondary | ICD-10-CM

## 2024-03-01 DIAGNOSIS — J3089 Other allergic rhinitis: Secondary | ICD-10-CM

## 2024-03-01 DIAGNOSIS — J4531 Mild persistent asthma with (acute) exacerbation: Secondary | ICD-10-CM | POA: Diagnosis not present

## 2024-03-01 DIAGNOSIS — K219 Gastro-esophageal reflux disease without esophagitis: Secondary | ICD-10-CM

## 2024-03-01 MED ORDER — LEVALBUTEROL HCL 0.63 MG/3ML IN NEBU: 0.6300 mg | INHALATION_SOLUTION | RESPIRATORY_TRACT | 1 refills | Status: AC | PRN

## 2024-03-01 MED ORDER — METHYLPREDNISOLONE ACETATE 80 MG/ML IJ SUSP
80.0000 mg | Freq: Once | INTRAMUSCULAR | Status: AC
  Administered 2024-03-01: 80 mg via INTRAMUSCULAR

## 2024-03-01 MED ORDER — LEVALBUTEROL TARTRATE 45 MCG/ACT IN AERO: 2.0000 | INHALATION_SPRAY | Freq: Four times a day (QID) | RESPIRATORY_TRACT | 5 refills | Status: AC | PRN

## 2024-03-01 NOTE — Progress Notes (Signed)
 FOLLOW UP Date of Service/Encounter:   03/01/2024  Subjective:  Traci Frank (DOB: 22-Jun-1980) is a 43 y.o. female who [facility] in re-evaluation of the following: Acute visit for cough and chest tightness History obtained from: chart review and patient.  For Review, LV was on 01/15/24 with Dr. Lorin seen for routine follow-up. See below for summary of history and diagnostics.   Therapeutic plans/changes recommended: FEV1 85%, overall doing well, continue on AIT. ----------------------------------------------------- Pertinent History/Diagnostics:  Asthma: Mild persistent  New onset post-COVID-19 infection. Symptoms of shortness of breath and wheezing, more than twice a week.  Rx: flovent  44mcg  in block therapy, albuterol  as needed  Allergic Rhinitis:  Increased sneezing, runny nose, and sinus issues since COVID-19 infection in 2023 Trigers: exposure to air conditioning, strong odors, and dog fur.  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 Discussed the use of AI scribe software for clinical note transcription with the patient, who gave verbal consent to proceed.  History of Present Illness Traci Frank is a 43 year old female with asthma who presents with persistent cough and chest congestion.  Patient did not originally have an appointment.  Initially told our shot room nurse that she was doing fine.  She was given her allergy  injection.  Later stated that she was coughing and having chest pain.  This provider recommended a same-day acute appointment to evaluate symptoms as well as review  safety regarding allergy  injections.  Cough and chest congestion - Persistent cough and chest congestion since last Wednesday after sensation of something flying down her throat - Cough exacerbates throat pain - Symptoms initially included sore throat and inability to swallow, now primarily linger in the chest - due to throat soreness, went to UC and was given prednisone  30 mg daily x 5 days. - Worse in the morning with yellow mucus, clear throughout the day - No fever, runny nose, or ear symptoms  Asthma symptoms and medication use - Wheezing present - Inconsistent use of inhalers due to jittery sensation and teeth chattering - Uses Airsupra  (rescue inhaler) and Qvar  (controller inhaler), but unsure about proper usage - Completed a course of prednisone  30 mg daily for five days with initial improvement, but symptoms progressed from throat to chest - Has a nebulizer but lacks medication for it  Oropharyngeal symptoms - Initial scratchy throat sensation after possible foreign body exposure - Sore throat and inability to swallow at onset  Gastroesophageal reflux symptoms - Reflux is currently well controlled  Today she received 0.15 mL of her red vials.  Her previous injection was 3 weeks ago on 02/07/2024 and she received 0.2 mL of her red vials.   All medications reviewed by clinical staff and updated in chart. No new pertinent medical or surgical history except as noted in HPI.  ROS: All others negative except as noted per HPI.   Objective:  BP 116/86   Pulse 86   Temp (!) 97.3 F (36.3 C) (Temporal)   Resp 18   LMP 04/13/2020   SpO2 99%  There is no height or weight on file to calculate BMI. Physical Exam: General Appearance:  Alert, cooperative, no distress, appears  stated age  Head:  Normocephalic, without obvious abnormality, atraumatic  Eyes:  Conjunctiva clear, EOM's intact  Ears EACs normal bilaterally and normal TMs bilaterally  Nose: Nares normal, hypertrophic  turbinates, normal mucosa, and no visible anterior polyps  Throat: Lips, tongue normal; teeth and gums normal, normal posterior oropharynx  Neck: Supple, symmetrical  Lungs:   end-expiratory wheezing, Respirations unlabored, no coughing  Heart:  regular rate and rhythm and no murmur, Appears well perfused  Extremities: No edema  Skin: Skin color, texture, turgor normal and no rashes or lesions on visualized portions of skin  Neurologic: No gross deficits   Labs:  Lab Orders  No laboratory test(s) ordered today    Spirometry:  Tracings reviewed. Her effort: Good reproducible efforts. FVC: 2.50L FEV1: 1.99L, 81% predicted FEV1/FVC ratio: 0.80 Interpretation: Spirometry consistent with normal pattern.  Please see scanned spirometry results for details.  Assessment/Plan   Asthma with acute exacerbation Asthma exacerbation likely triggered by environmental allergies vs viral infection. Symptoms include wheezing, cough, and chest tightness. Recent prednisone  course provided partial relief, but symptoms persist. Wheezing on expiration. No fever or other concerning symptoms/signs of bacterial infection. Current inhaler (Airsupra ) causes jitteriness, leading to underuse. Qvar  not used consistently due to confusion about its role in management. - Administer steroid injection today.80 mg IM depo - CONTROLLER inhaler to be used DURING ASTHMA FLARES OR RESPIRATORY ILLNESS: Start Qvar  40 mcg, 4 puffs twice daily for 1-2 weeks during flare-ups or until symptoms improved. - RESCUE meds;  - Use Airsupra  as rescue inhaler as needed. - Attempt to get Xopenex  (levalbuterol ) approved to replace Airsupra  to reduce jitteriness.Use every 4 hours as needed for shortness of breath, chest tightness, wheezing. - alternatively can use nebulizer with Xopenex  (levalbuterol ) every 4-6 hours for shortness of breath, chest tightness, wheezing.  - okay to use rescue meds every 4 hours while awake for the first several  days of a flare up to help keep lungs open and then as needed - Avoid allergy  shots during asthma flares. Skip next week and if feeling better can return the following week. - Rinse mouth after using Qvar  to prevent oral thrush.  Allergic  Rhinitis on AIT  - Allergy  test: positive to grass, weed, tree, mold, tobacoo leaf  - Continue avoidance of irritant and allergic triggers  - Continue  Flonase  1 spray per nostril twice daily.  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 Symptoms resolved with avoidance. -Continue avoidance of acrylates, particularly nail glue.  Gastroesophageal Reflux Disease (GERD) Controlled with medication, occasional breakthrough symptoms with certain foods. -Continue pantoprazole  20 mg daily -Advise on diet control to prevent reflux-induced respiratory symptoms.  Follow up: in clinic in 12 weeks, sooner if needed with Dr. Lorin  Thank you so much for letting me partake in your care today.  Don't hesitate to reach out if you have any additional concerns!  Rocky Endow, MD [facility]  Other: none  Rocky Endow, MD  [facility] 

## 2024-03-01 NOTE — Telephone Encounter (Signed)
  Pt called to cancel home sleep study  Pt wants to reschedule appt she stated she will not be able to do it because Pt is very sick .

## 2024-03-01 NOTE — Patient Instructions (Addendum)
 Asthma with acute exacerbation Asthma exacerbation likely triggered by environmental allergies vs viral infection. Symptoms include wheezing, cough, and chest tightness. Recent prednisone  course provided partial relief, but symptoms persist. Wheezing on expiration. No fever or other concerning symptoms/signs of bacterial infection. Current inhaler (Airsupra ) causes jitteriness, leading to underuse. Qvar  not used consistently due to confusion about its role in management. - Administer steroid injection today.80 mg IM depo - CONTROLLER inhaler to be used DURING ASTHMA FLARES OR RESPIRATORY ILLNESS: Start Qvar  40 mcg, 4 puffs twice daily for 1-2 weeks during flare-ups or until symptoms improved. - RESCUE meds;  - Use Airsupra  as rescue inhaler as needed. - Attempt to get Xopenex  (levalbuterol ) approved to replace Airsupra  to reduce jitteriness.Use every 4 hours as needed for shortness of breath, chest tightness, wheezing. - alternatively can use nebulizer with Xopenex  (levalbuterol ) every 4-6 hours for shortness of breath, chest tightness, wheezing.  - okay to use rescue meds every 4 hours while awake for the first several days of a flare up to help keep lungs open and then as needed - Avoid allergy  shots during asthma flares. Skip next week and if feeling better can return the following week. - Rinse mouth after using Qvar  to prevent oral thrush.  Allergic  Rhinitis on AIT  - Allergy  test: positive to grass, weed, tree, mold, tobacoo leaf  - Continue avoidance of irritant and allergic triggers  - Continue  Flonase  1 spray per nostril twice daily.  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 Symptoms resolved with avoidance. -Continue avoidance of acrylates, particularly nail  glue.  Gastroesophageal Reflux Disease (GERD) Controlled with medication, occasional breakthrough symptoms with certain foods. -Continue pantoprazole  20 mg daily -Advise on diet control to prevent reflux-induced respiratory symptoms.  Follow up: in clinic in 12 weeks, sooner if needed with Dr. Lorin  Thank you so much for letting me partake in your care today.  Don't hesitate to reach out if you have any additional concerns!  Rocky Endow, MD [facility]

## 2024-03-13 ENCOUNTER — Ambulatory Visit

## 2024-03-13 DIAGNOSIS — J309 Allergic rhinitis, unspecified: Secondary | ICD-10-CM | POA: Diagnosis not present

## 2024-03-15 ENCOUNTER — Ambulatory Visit (INDEPENDENT_AMBULATORY_CARE_PROVIDER_SITE_OTHER): Admitting: Neurology

## 2024-03-15 DIAGNOSIS — R0683 Snoring: Secondary | ICD-10-CM

## 2024-03-15 DIAGNOSIS — E669 Obesity, unspecified: Secondary | ICD-10-CM

## 2024-03-15 DIAGNOSIS — Z9189 Other specified personal risk factors, not elsewhere classified: Secondary | ICD-10-CM

## 2024-03-15 DIAGNOSIS — G4719 Other hypersomnia: Secondary | ICD-10-CM

## 2024-03-15 DIAGNOSIS — G4733 Obstructive sleep apnea (adult) (pediatric): Secondary | ICD-10-CM

## 2024-03-15 DIAGNOSIS — R519 Headache, unspecified: Secondary | ICD-10-CM

## 2024-03-15 DIAGNOSIS — Z820 Family history of epilepsy and other diseases of the nervous system: Secondary | ICD-10-CM

## 2024-03-15 DIAGNOSIS — G4734 Idiopathic sleep related nonobstructive alveolar hypoventilation: Secondary | ICD-10-CM

## 2024-03-25 ENCOUNTER — Other Ambulatory Visit: Payer: Self-pay

## 2024-03-25 ENCOUNTER — Ambulatory Visit (INDEPENDENT_AMBULATORY_CARE_PROVIDER_SITE_OTHER)

## 2024-03-25 DIAGNOSIS — J309 Allergic rhinitis, unspecified: Secondary | ICD-10-CM

## 2024-03-28 NOTE — Progress Notes (Unsigned)
 [name]

## 2024-03-29 ENCOUNTER — Ambulatory Visit: Payer: Self-pay | Admitting: Neurology

## 2024-03-29 DIAGNOSIS — R0683 Snoring: Secondary | ICD-10-CM

## 2024-03-29 DIAGNOSIS — E669 Obesity, unspecified: Secondary | ICD-10-CM

## 2024-03-29 DIAGNOSIS — G4719 Other hypersomnia: Secondary | ICD-10-CM

## 2024-03-29 DIAGNOSIS — G4734 Idiopathic sleep related nonobstructive alveolar hypoventilation: Secondary | ICD-10-CM

## 2024-03-29 DIAGNOSIS — Z9189 Other specified personal risk factors, not elsewhere classified: Secondary | ICD-10-CM

## 2024-03-29 NOTE — Procedures (Signed)
 [facility]  HOME SLEEP TEST (SANSA) REPORT (Mail-Out Device):   STUDY DATE: 03/23/2024  DOB: 1981/02/05  MRN: [id number]  ORDERING CLINICIAN: True Mar, MD, PhD   REFERRING CLINICIAN: Lowella Benton CROME, PA   CLINICAL INFORMATION/HISTORY (obtained from visit note dated 02/13/2024): 43 year old female with an underlying medical history of anemia, allergies, asthma, anxiety, depression, reflux disease, hypertension, headaches, uterine fibroid, vertigo, prediabetes, vitamin D  deficiency, chronic constipation, and obesity, who reports snoring and excessive daytime somnolence.   PATIENT'S LAST REPORTED EPWORTH SLEEPINESS SCORE (ESS): 9/24.  BMI (at the time of sleep clinic visit and/or test date): 39.7 kg/m  FINDINGS:   Study Protocol:    The SANSA single-point-of-skin-contact chest-worn sensor - an FDA cleared and DOT approved type 4 home sleep test device - measures eight physiological channels,  including blood oxygen saturation (measured via PPG [photoplethysmography]), EKG-derived heart rate, respiratory effort, chest movement (measured via accelerometer), snoring, body position, and actigraphy. The device is designed to be worn for up to 10 hours per study.   Sleep Summary:   Total Recording Time (hours, min): 9 hours, 30 min  Total Effective Sleep Time (hours, min):  7 hours, 49 min  Sleep Efficiency (%):    82%   Respiratory Indices:   Calculated sAHI (per hour):  4.7/hour         Oxygen Saturation Statistics:    Oxygen Saturation (%) Mean: 92.7%   Minimum oxygen saturation (%):                 73.7%    O2 Saturation Range (%): 73.7-98.2%   Time below or at 88% saturation: 2 min   Pulse Rate Statistics:   Pulse Mean (bpm):    71/min    Pulse Range (56-110/min)   Snoring: Mild to moderate  IMPRESSION/DIAGNOSES:     Primary snoring Oxygen desaturations while asleep At risk for obstructive sleep apnea   RECOMMENDATIONS:   This  home sleep test does not demonstrate any significant obstructive sleep apnea with an AHI borderline at 4.7/h but there were several oxygen desaturations during sleep and mild to moderate snoring was detected.  While treatment with a positive airway pressure device is not warranted based on this test, if clinical concern for underlying obstructive sleep apnea persists, consideration should be given to repeating her sleep test with a laboratory attended sleep study for better clarification of her sleep disordered breathing.  Home sleep testing can result in underestimation of sleep apnea.  If patient is agreeable, I recommend repeating sleep testing with a nocturnal polysomnogram in lab.   For disturbing snoring, an oral appliance through dentistry or orthodontics can be considered.  Other causes of the patient's symptoms, including circadian rhythm disturbances, an underlying mood disorder, medication effect and/or an underlying medical problem cannot be ruled out based on this test. Clinical correlation is recommended.  The patient should be cautioned not to drive, work at heights, or operate dangerous or heavy equipment when tired or sleepy. Review and reiteration of good sleep hygiene measures should be pursued with any patient. The patient will be advised to follow up with her referring provider, who will be notified of the test results.   I certify that I have reviewed the raw data recording prior to the issuance of this report in accordance with the standards of the American Academy of Sleep Medicine (AASM).    INTERPRETING PHYSICIAN:   True Mar, MD, PhD Medical Director, [facility]) Diplomat,  ABPN (Neurology and Sleep)   [facility] [address] [place], [place] [address] [phone]

## 2024-04-01 NOTE — Telephone Encounter (Signed)
 Order for PSG was already placed, see under procedure tab.

## 2024-04-02 ENCOUNTER — Telehealth: Payer: Self-pay | Admitting: Neurology

## 2024-04-02 NOTE — Telephone Encounter (Signed)
 Noted, case as been started NPSG pending with [employer].

## 2024-04-02 NOTE — Telephone Encounter (Signed)
 NPSG [employer] pending

## 2024-04-04 NOTE — Telephone Encounter (Signed)
 NPSG [employer] shara: [id number] (Exp. 04/02/24 to 07/01/24)

## 2024-04-05 ENCOUNTER — Ambulatory Visit (INDEPENDENT_AMBULATORY_CARE_PROVIDER_SITE_OTHER): Admitting: *Deleted

## 2024-04-05 DIAGNOSIS — J309 Allergic rhinitis, unspecified: Secondary | ICD-10-CM | POA: Diagnosis not present

## 2024-04-16 DIAGNOSIS — J301 Allergic rhinitis due to pollen: Secondary | ICD-10-CM | POA: Diagnosis not present

## 2024-04-16 DIAGNOSIS — J302 Other seasonal allergic rhinitis: Secondary | ICD-10-CM | POA: Diagnosis not present

## 2024-04-16 NOTE — Progress Notes (Signed)
 VIALS MADE ON 04/16/24

## 2024-04-19 ENCOUNTER — Ambulatory Visit (INDEPENDENT_AMBULATORY_CARE_PROVIDER_SITE_OTHER)

## 2024-04-19 DIAGNOSIS — J309 Allergic rhinitis, unspecified: Secondary | ICD-10-CM

## 2024-04-27 ENCOUNTER — Other Ambulatory Visit: Payer: Self-pay | Admitting: Urgent Care

## 2024-04-27 DIAGNOSIS — J302 Other seasonal allergic rhinitis: Secondary | ICD-10-CM

## 2024-04-29 ENCOUNTER — Ambulatory Visit (INDEPENDENT_AMBULATORY_CARE_PROVIDER_SITE_OTHER)

## 2024-04-29 DIAGNOSIS — J309 Allergic rhinitis, unspecified: Secondary | ICD-10-CM

## 2024-04-29 NOTE — Telephone Encounter (Signed)
 Spoke with the patient. She is scheduled at [facility] for 04/30/24 at 8 pm.  She states she will arrive by 8:30 pm.  Sent mychart message.  NPSG [employer] shara: [id number] (exp. 04/02/24 to 07/01/24)

## 2024-04-30 ENCOUNTER — Ambulatory Visit: Admitting: Neurology

## 2024-04-30 DIAGNOSIS — G4733 Obstructive sleep apnea (adult) (pediatric): Secondary | ICD-10-CM

## 2024-04-30 DIAGNOSIS — G4719 Other hypersomnia: Secondary | ICD-10-CM

## 2024-04-30 DIAGNOSIS — E669 Obesity, unspecified: Secondary | ICD-10-CM

## 2024-04-30 DIAGNOSIS — G4720 Circadian rhythm sleep disorder, unspecified type: Secondary | ICD-10-CM

## 2024-04-30 DIAGNOSIS — R0683 Snoring: Secondary | ICD-10-CM

## 2024-04-30 DIAGNOSIS — G4734 Idiopathic sleep related nonobstructive alveolar hypoventilation: Secondary | ICD-10-CM

## 2024-04-30 DIAGNOSIS — Z9189 Other specified personal risk factors, not elsewhere classified: Secondary | ICD-10-CM

## 2024-05-14 ENCOUNTER — Ambulatory Visit: Payer: Self-pay | Admitting: Neurology

## 2024-05-14 DIAGNOSIS — G4733 Obstructive sleep apnea (adult) (pediatric): Secondary | ICD-10-CM

## 2024-05-14 NOTE — Procedures (Signed)
 Physician Interpretation: Please see link under Procedure Tab or under Encounters tab for physician report, technical report, as well as O2 titration and/or PAP titration tables (if applicable).

## 2024-05-21 NOTE — Telephone Encounter (Signed)
 Pt called requesting for a nurse to call her back with her sleep study results. Please advise.

## 2024-05-24 NOTE — Telephone Encounter (Signed)
-----   Message from True Mar, MD sent at 05/14/2024  6:02 PM EST ----- Patient referred by PCP, seen by me on 02/13/24, diagnostic PSG on 04/30/24 (after an HST which was suspicious for desats but neg for OSA by AHI criteria).    Please call and notify the patient that the recent sleep study did confirm the diagnosis of obstructive sleep apnea. OSA is overall mild, but worth treating to see if she feels better after  treatment. To that end I recommend treatment for this in the form of autoPAP, which means, that we don't have to bring her back for a second sleep study with CPAP, but will let him try an autoPAP  machine at home, through a DME company (of her choice, or as per insurance requirement). The DME representative will educate her on how to use the machine, how to put the mask on, etc. I have placed  an order in the chart. Please send referral, talk to patient, send report to referring MD. We will need a FU in sleep clinic for 10 weeks post-PAP set up, please arrange that with me or one of our  NPs. Thanks,   True Mar, MD, PhD [facility])   ----- Message ----- From: Rebecka Fleeta Higashi In One Three One Sent: 05/14/2024   6:00 PM EST To: True Mar, MD

## 2024-05-24 NOTE — Progress Notes (Signed)
 Tried calling patient but unable to reach. LVM and will sent mychart message.

## 2024-05-27 ENCOUNTER — Ambulatory Visit

## 2024-05-27 DIAGNOSIS — J309 Allergic rhinitis, unspecified: Secondary | ICD-10-CM

## 2024-06-02 ENCOUNTER — Ambulatory Visit
Admission: EM | Admit: 2024-06-02 | Discharge: 2024-06-02 | Disposition: A | Discharge status: Home / Self Care | Attending: Family Medicine | Admitting: Family Medicine

## 2024-06-02 DIAGNOSIS — R051 Acute cough: Secondary | ICD-10-CM

## 2024-06-02 DIAGNOSIS — J4521 Mild intermittent asthma with (acute) exacerbation: Secondary | ICD-10-CM | POA: Diagnosis not present

## 2024-06-02 DIAGNOSIS — R110 Nausea: Secondary | ICD-10-CM | POA: Diagnosis not present

## 2024-06-02 DIAGNOSIS — J101 Influenza due to other identified influenza virus with other respiratory manifestations: Secondary | ICD-10-CM

## 2024-06-02 LAB — POCT INFLUENZA A/B
Influenza A, POC: POSITIVE — AB
Influenza B, POC: NEGATIVE

## 2024-06-02 MED ORDER — OSELTAMIVIR PHOSPHATE 75 MG PO CAPS: 75.0000 mg | ORAL_CAPSULE | Freq: Two times a day (BID) | ORAL | 0 refills | Status: AC

## 2024-06-02 MED ORDER — IPRATROPIUM-ALBUTEROL 0.5-2.5 (3) MG/3ML IN SOLN
3.0000 mL | Freq: Once | RESPIRATORY_TRACT | Status: AC
  Administered 2024-06-02: 3 mL via RESPIRATORY_TRACT

## 2024-06-02 MED ORDER — ONDANSETRON 4 MG PO TBDP
4.0000 mg | ORAL_TABLET | Freq: Once | ORAL | Status: AC
  Administered 2024-06-02: 4 mg via ORAL

## 2024-06-02 MED ORDER — ONDANSETRON 4 MG PO TBDP: 4.0000 mg | ORAL_TABLET | Freq: Three times a day (TID) | ORAL | 0 refills | Status: AC | PRN

## 2024-06-02 MED ORDER — PROMETHAZINE-DM 6.25-15 MG/5ML PO SYRP: 5.0000 mL | ORAL_SOLUTION | Freq: Four times a day (QID) | ORAL | 0 refills | Status: AC | PRN

## 2024-06-02 MED ORDER — PREDNISONE 20 MG PO TABS: 40.0000 mg | ORAL_TABLET | Freq: Every day | ORAL | 0 refills | Status: AC

## 2024-06-02 NOTE — ED Triage Notes (Addendum)
 Pt states cough for the past 2 days.  States she is also nauseous and has a stuffy nose.  States this morning she woke up with a fever.  States she has been taking Robitussin at home with no relief. States she had a negative home covid test.

## 2024-06-02 NOTE — ED Provider Notes (Addendum)
 " [facility]    CSN: [id number] Arrival date & time: 06/02/24  1054      History   Chief Complaint Chief Complaint  Patient presents with   Cough    HPI Traci Frank is a 43 y.o. female  presents for evaluation of URI symptoms for 2 days. Patient reports associated symptoms of cough, congestion, sore throat, nausea vomiting, wheezing, fever this morning of 101. Denies diarrhea, ear pain, body aches. Patient does have a hx of asthma.  Has been using her albuterol  inhaler with temporary improvement in symptoms.  Patient is not an active smoker.   Reports no known sick contacts.  Pt has taken Robitussin OTC for symptoms. Pt has no other concerns at this time.    Cough Associated symptoms: fever, sore throat and wheezing     Past Medical History:  Diagnosis Date   Abnormal involuntary movements(781.0)    Right hand   Allergy     Anemia    Anxiety    Asthma    Depression    Disturbance of skin sensation    Right arm   GERD (gastroesophageal reflux disease)    Headache(784.0)    Hypertension    Tingling sensation 05/27/2013   Tremor of unknown origin 05/27/2013   Uterine fibroid    Vertigo     Patient Active Problem List   Diagnosis Date Noted   Seasonal and perennial allergic rhinitis 03/01/2024   Wheezing 02/27/2023   Seasonal allergies 02/16/2023   Rhinorrhea 02/16/2023   Snoring 02/16/2023   Other fatigue 02/16/2023   Primary hypertension 01/19/2023   Asthma with acute exacerbation 07/14/2021   GERD (gastroesophageal reflux disease) 07/14/2021   Pelvic mass in female 05/07/2020   Leiomyomatosis 03/30/2020   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   Obesity (BMI 30-39.9) 03/30/2020   Iron deficiency anemia due to chronic blood loss 03/30/2020   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   Major depressive disorder, recurrent episode, in full remission 02/01/2019   GAD (generalized anxiety disorder) 02/01/2019   Panic disorder  02/01/2019   Current severe episode of major depressive disorder without psychotic features without prior episode (HCC) 12/16/2018   Abdominal fullness in left lower quadrant 01/11/2018   History of uterine fibroid 01/11/2018   Essential hypertension 07/05/2016   Chronic idiopathic constipation 07/05/2016   Tingling sensation 05/27/2013   Tremor of unknown origin 05/27/2013    Past Surgical History:  Procedure Laterality Date   HERNIA REPAIR     HYSTERECTOMY ABDOMINAL WITH SALPINGECTOMY Bilateral 05/07/2020   Procedure: EXPLORATORY LAPAROTOMY,TOTAL HYSTERECTOMY ABDOMINAL WITH BILATERAL  SALPINGECTOMY GREATER THAN 250GM;  Surgeon: Eloy Herring, MD;  Location: [facility];  Service: Gynecology;  Laterality: Bilateral;   MYOMECTOMY     OMENTECTOMY N/A 05/07/2020   Procedure: OMENTECTOMY, RADICAL TUMOR DEBULKING;  Surgeon: Eloy Herring, MD;  Location: [facility];  Service: Gynecology;  Laterality: N/A;   TOTAL ABDOMINAL HYSTERECTOMY Bilateral 05/07/2020    OB History     Gravida  0   Para  0   Term  0   Preterm  0   AB  0   Living  0      SAB  0   IAB  0   Ectopic  0   Multiple  0   Live Births  0            Home Medications    Prior to Admission medications  Medication Sig Start Date End Date Taking? Authorizing Provider  ondansetron  (  ZOFRAN -ODT) 4 MG disintegrating tablet Take 1 tablet (4 mg total) by mouth every 8 (eight) hours as needed for nausea or vomiting. 06/02/24  Yes Anginette Espejo, Jodi R, NP  oseltamivir  (TAMIFLU ) 75 MG capsule Take 1 capsule (75 mg total) by mouth every 12 (twelve) hours. 06/02/24  Yes Stefan Karen, Jodi R, NP  predniSONE  (DELTASONE ) 20 MG tablet Take 2 tablets (40 mg total) by mouth daily with breakfast for 5 days. 06/02/24 06/07/24 Yes Jarita Raval, Jodi R, NP  promethazine -dextromethorphan (PROMETHAZINE -DM) 6.25-15 MG/5ML syrup Take 5 mLs by mouth 4 (four) times daily as needed for cough. 06/02/24  Yes Satya Bohall, Jodi R, NP  amitriptyline  (ELAVIL ) 50 MG tablet Take  1 tablet (50 mg total) by mouth at bedtime. One half tab PO qHS for a week, then one tab PO qHS. 02/08/24   Crain, Whitney L, PA  amLODipine  (NORVASC ) 10 MG tablet Take 1 tablet (10 mg total) by mouth daily. 12/14/23   Crain, Whitney L, PA  azelastine  (ASTELIN ) 0.1 % nasal spray Place 2 sprays into both nostrils 2 (two) times daily. Use in each nostril as directed 01/15/24   Lorin Norris, MD  beclomethasone (QVAR  REDIHALER) 40 MCG/ACT inhaler Inhale 2 puffs into the lungs 2 (two) times daily. 01/15/24   Lorin Norris, MD  cyclobenzaprine  (FLEXERIL ) 10 MG tablet Take 1 tablet (10 mg total) by mouth 2 (two) times daily as needed for muscle spasms. 12/18/23   Randol Simmonds, MD  EPINEPHrine  0.3 mg/0.3 mL IJ SOAJ injection Inject 0.3 mg into the muscle as needed for anaphylaxis. 08/10/23   Lorin Norris, MD  famotidine  (PEPCID ) 20 MG tablet Take 1 tabs Thursday and Friday twice daily before [facility] appt. Patient not taking: Reported on 03/01/2024 08/10/23   Lorin Norris, MD  fluticasone  (FLONASE ) 50 MCG/ACT nasal spray Place 1 spray into both nostrils daily. 04/04/23   Lorin Norris, MD  fluticasone  (FLOVENT  HFA) 44 MCG/ACT inhaler Inhale 2 puffs into the lungs 2 (two) times daily. Patient not taking: Reported on 03/01/2024 09/18/23   Lorin Norris, MD  hydrochlorothiazide  (MICROZIDE ) 12.5 MG capsule Take 1 capsule (12.5 mg total) by mouth daily. 12/14/23   Crain, Whitney L, PA  ipratropium (ATROVENT ) 0.03 % nasal spray Place 2 sprays into both nostrils 2 (two) times daily. 09/18/23   Lorin Norris, MD  levalbuterol  (XOPENEX  HFA) 45 MCG/ACT inhaler Inhale 2 puffs into the lungs every 6 (six) hours as needed for wheezing. 03/01/24   Marinda Rocky SAILOR, MD  levalbuterol  (XOPENEX ) 0.63 MG/3ML nebulizer solution Take 3 mLs (0.63 mg total) by nebulization every 4 (four) hours as needed for wheezing or shortness of breath. 03/01/24   Marinda Rocky SAILOR, MD  levocetirizine (XYZAL ) 5 MG tablet Take 1 tablet (5 mg total)  by mouth every evening. 12/14/23   Crain, Whitney L, PA  linaclotide  (LINZESS ) 72 MCG capsule Take 1 capsule (72 mcg total) by mouth daily before breakfast. 12/14/23   Crain, Whitney L, PA  montelukast  (SINGULAIR ) 10 MG tablet Take 1 tablet (10 mg total) by mouth at bedtime. 12/14/23   Crain, Benton L, PA  Multiple Vitamin (THERA) TABS Take 1 tablet by mouth daily.    [provider]  naproxen  (NAPROSYN ) 375 MG tablet Take 1 tablet (375 mg total) by mouth 2 (two) times daily. 12/18/23   Randol Simmonds, MD  pantoprazole  (PROTONIX ) 20 MG tablet Take 1 tablet (20 mg total) by mouth daily. 12/14/23   Crain, Whitney L, PA  valsartan  (DIOVAN ) 160 MG tablet Take 1 tablet (  160 mg total) by mouth daily. 12/14/23   Crain, Whitney L, PA  Vitamin D , Ergocalciferol , (DRISDOL ) 1.25 MG (50000 UNIT) CAPS capsule Take 1 capsule (50,000 Units total) by mouth every 7 (seven) days. 12/15/23   Lowella Benton CROME, PA    Family History Family History  Problem Relation Age of Onset   Hypertension Mother    Arthritis Mother    Varicose Veins Mother    Drug abuse Father    Diabetes Maternal Grandmother    Asthma Maternal Grandmother    Obesity Maternal Grandmother    Narcolepsy Maternal Grandmother    Diabetes Maternal Grandfather    COPD Paternal Grandmother    Obesity Paternal Grandmother    Stroke Paternal Grandfather    Breast cancer Maternal Aunt    Asthma Maternal Aunt    Cancer Maternal Aunt    COPD Maternal Aunt    Breast cancer Paternal Aunt    Alcohol abuse Paternal Aunt    Cancer Paternal Aunt    Bipolar disorder Cousin    Sleep apnea Neg Hx     Social History Social History[1]   Allergies   Latex   Review of Systems Review of Systems  Constitutional:  Positive for fever.  HENT:  Positive for congestion and sore throat.   Respiratory:  Positive for cough and wheezing.   Gastrointestinal:  Positive for nausea and vomiting.     Physical Exam Triage Vital Signs ED Triage Vitals   Encounter Vitals Group     BP 06/02/24 1202 (!) 138/91     Girls Systolic BP Percentile --      Girls Diastolic BP Percentile --      Boys Systolic BP Percentile --      Boys Diastolic BP Percentile --      Pulse Rate 06/02/24 1202 97     Resp 06/02/24 1202 16     Temp 06/02/24 1202 98.4 F (36.9 C)     Temp Source 06/02/24 1202 Oral     SpO2 06/02/24 1202 96 %     Weight --      Height --      Head Circumference --      Peak Flow --      Pain Score 06/02/24 1201 0     Pain Loc --      Pain Education --      Exclude from Growth Chart --    No data found.  Updated Vital Signs BP (!) 138/91 (BP Location: Left Arm)   Pulse 97   Temp 98.4 F (36.9 C) (Oral)   Resp 16   LMP 04/13/2020   SpO2 96%   Visual Acuity Right Eye Distance:   Left Eye Distance:   Bilateral Distance:    Right Eye Near:   Left Eye Near:    Bilateral Near:     Physical Exam Vitals and nursing note reviewed.  Constitutional:      General: She is not in acute distress.    Appearance: She is well-developed. She is not ill-appearing.  HENT:     Head: Normocephalic and atraumatic.     Right Ear: Tympanic membrane and ear canal normal.     Left Ear: Tympanic membrane and ear canal normal.     Nose: Congestion present.     Mouth/Throat:     Mouth: Mucous membranes are moist.     Pharynx: Oropharynx is clear. Uvula midline. No oropharyngeal exudate or posterior oropharyngeal erythema.     Tonsils:  No tonsillar exudate or tonsillar abscesses.  Eyes:     Conjunctiva/sclera: Conjunctivae normal.     Pupils: Pupils are equal, round, and reactive to light.  Cardiovascular:     Rate and Rhythm: Normal rate and regular rhythm.     Heart sounds: Normal heart sounds.  Pulmonary:     Effort: Pulmonary effort is normal.     Breath sounds: Normal breath sounds.  Musculoskeletal:     Cervical back: Normal range of motion and neck supple.  Lymphadenopathy:     Cervical: No cervical adenopathy.  Skin:     General: Skin is warm and dry.  Neurological:     General: No focal deficit present.     Mental Status: She is alert and oriented to person, place, and time.  Psychiatric:        Mood and Affect: Mood normal.        Behavior: Behavior normal.      UC Treatments / Results  Labs (all labs ordered are listed, but only abnormal results are displayed) Labs Reviewed  POCT INFLUENZA A/B - Abnormal; Notable for the following components:      Result Value   Influenza A, POC Positive (*)    All other components within normal limits    EKG   Radiology No results found.  Procedures Procedures (including critical care time)  Medications Ordered in UC Medications  ondansetron  (ZOFRAN -ODT) disintegrating tablet 4 mg (4 mg Oral Given 06/02/24 1235)  ipratropium-albuterol  (DUONEB) 0.5-2.5 (3) MG/3ML nebulizer solution 3 mL (3 mLs Nebulization Given 06/02/24 1235)    Initial Impression / Assessment and Plan / UC Course  I have reviewed the triage vital signs and the nursing notes.  Pertinent labs & imaging results that were available during my care of the patient were reviewed by me and considered in my medical decision making (see chart for details).     Patient requested breathing treatment in clinic for shortness of breath with improvement in symptoms..  Lungs remain CTA.  Positive influenza A, will start Tamiflu .  Will do prednisone  to manage her asthma symptoms and she will continue her albuterol  inhaler as needed.  Promethazine  DM as needed for cough.  Zofran  as needed for nausea vomiting advised OTC analgesics/fever reducer medications as needed, rest fluids.  PCP follow-up if symptoms do not improve.  ER precautions reviewed. Final Clinical Impressions(s) / UC Diagnoses   Final diagnoses:  Acute cough  Mild intermittent asthma with acute exacerbation  Nausea  Influenza A     Discharge Instructions      You have tested positive for the flu.  Start Tamiflu  antiviral to  help prevent complication and severity of symptoms.  Continue your albuterol  inhaler and start prednisone  daily for 5 days to help manage your asthma symptoms.  You may take Promethazine  DM as needed for your cough, this will make you drowsy.  Do not drink alcohol or drive while on this medication.  You may use over-the-counter Tylenol  or ibuprofen  as needed for fever/body aches.  Lots of rest and fluids.  Follow-up with your PCP if your symptoms do not improve.  Please go to the ER for any worsening symptoms.  Hope you feel better soon!     ED Prescriptions     Medication Sig Dispense Auth. Provider   oseltamivir  (TAMIFLU ) 75 MG capsule Take 1 capsule (75 mg total) by mouth every 12 (twelve) hours. 10 capsule Maurissa Ambrose, Jodi R, NP   predniSONE  (DELTASONE ) 20 MG tablet Take  2 tablets (40 mg total) by mouth daily with breakfast for 5 days. 10 tablet Diamonds Lippard, Jodi R, NP   promethazine -dextromethorphan (PROMETHAZINE -DM) 6.25-15 MG/5ML syrup Take 5 mLs by mouth 4 (four) times daily as needed for cough. 118 mL Gokul Waybright, Jodi R, NP   ondansetron  (ZOFRAN -ODT) 4 MG disintegrating tablet Take 1 tablet (4 mg total) by mouth every 8 (eight) hours as needed for nausea or vomiting. 8 tablet Alexandr Oehler, Jodi R, NP      PDMP not reviewed this encounter.    Loreda Myla SAUNDERS, NP 06/02/24 1306     [1]  Social History Tobacco Use   Smoking status: Never    Passive exposure: Never   Smokeless tobacco: Never  Vaping Use   Vaping status: Never Used  Substance Use Topics   Alcohol use: Not Currently   Drug use: No     Loreda Myla SAUNDERS, NP 06/02/24 1309  "

## 2024-06-02 NOTE — Discharge Instructions (Signed)
 You have tested positive for the flu.  Start Tamiflu  antiviral to help prevent complication and severity of symptoms.  Continue your albuterol  inhaler and start prednisone  daily for 5 days to help manage your asthma symptoms.  You may take Promethazine  DM as needed for your cough, this will make you drowsy.  Do not drink alcohol or drive while on this medication.  You may use over-the-counter Tylenol  or ibuprofen  as needed for fever/body aches.  Lots of rest and fluids.  Follow-up with your PCP if your symptoms do not improve.  Please go to the ER for any worsening symptoms.  Hope you feel better soon!

## 2024-06-28 ENCOUNTER — Ambulatory Visit (INDEPENDENT_AMBULATORY_CARE_PROVIDER_SITE_OTHER)

## 2024-06-28 DIAGNOSIS — J302 Other seasonal allergic rhinitis: Secondary | ICD-10-CM | POA: Diagnosis not present

## 2024-07-10 ENCOUNTER — Ambulatory Visit
Admission: EM | Admit: 2024-07-10 | Discharge: 2024-07-10 | Disposition: A | Discharge status: Home / Self Care | Source: Home / Self Care

## 2024-07-10 DIAGNOSIS — J018 Other acute sinusitis: Secondary | ICD-10-CM | POA: Diagnosis not present

## 2024-07-10 MED ORDER — AMOXICILLIN-POT CLAVULANATE 875-125 MG PO TABS: 1.0000 | ORAL_TABLET | Freq: Two times a day (BID) | ORAL | 0 refills | Status: AC

## 2024-07-10 NOTE — ED Provider Notes (Signed)
 " [facility]  Note:  This document was prepared using Conservation officer, historic buildings and may include unintentional dictation errors.  MRN: [id number] DOB: 07-07-1980  Subjective:   Traci Frank is a 44 y.o. female presenting for 1 week history of recurrent persistent sinus congestion, sinus fullness, left ear pain.  Patient underwent treatment for flu a at the end of December, beginning of January.  She did not end up using the prednisone  then.  This past week she started it as her symptoms returned.  No chest pain, shortness of breath or wheezing.  She has coughed up plenty of phlegm.  She did have exposure to mold at the [employer]  This is when her symptoms started.  Current Outpatient Medications  Medication Instructions   amitriptyline  (ELAVIL ) 50 mg, Oral, Daily at bedtime, One half tab PO qHS for a week, then one tab PO qHS.   amLODipine  (NORVASC ) 10 mg, Oral, Daily   azelastine  (ASTELIN ) 0.1 % nasal spray 2 sprays, Each Nare, 2 times daily, Use in each nostril as directed   beclomethasone (QVAR  REDIHALER) 40 MCG/ACT inhaler 2 puffs, Inhalation, 2 times daily   cyclobenzaprine  (FLEXERIL ) 10 mg, Oral, 2 times daily PRN   EPINEPHrine  (EPI-PEN) 0.3 mg, Intramuscular, As needed   famotidine  (PEPCID ) 20 MG tablet Take 1 tabs Thursday and Friday twice daily before [facility] appt.   fluticasone  (FLONASE ) 50 MCG/ACT nasal spray 1 spray, Each Nare, Daily   fluticasone  (FLOVENT  HFA) 44 MCG/ACT inhaler 2 puffs, Inhalation, 2 times daily   hydrochlorothiazide  (MICROZIDE ) 12.5 mg, Oral, Daily   ipratropium (ATROVENT ) 0.03 % nasal spray 2 sprays, Each Nare, 2 times daily   levalbuterol  (XOPENEX  HFA) 45 MCG/ACT inhaler 2 puffs, Inhalation, Every 6 hours PRN   levalbuterol  (XOPENEX ) 0.63 mg, Nebulization, Every 4 hours PRN   levocetirizine (XYZAL ) 5 mg, Oral, Every evening   linaclotide  (LINZESS ) 72 mcg, Oral, Daily before breakfast   montelukast  (SINGULAIR ) 10 mg,  Oral, Daily at bedtime   Multiple Vitamin (THERA) TABS 1 tablet, Daily   naproxen  (NAPROSYN ) 375 mg, Oral, 2 times daily   ondansetron  (ZOFRAN -ODT) 4 mg, Oral, Every 8 hours PRN   oseltamivir  (TAMIFLU ) 75 mg, Oral, Every 12 hours   pantoprazole  (PROTONIX ) 20 mg, Oral, Daily   promethazine -dextromethorphan (PROMETHAZINE -DM) 6.25-15 MG/5ML syrup 5 mLs, Oral, 4 times daily PRN   valsartan  (DIOVAN ) 160 mg, Oral, Daily   Vitamin D  (Ergocalciferol ) (DRISDOL ) 50,000 Units, Oral, Every 7 days    Allergies[1]  Past Medical History:  Diagnosis Date   Abnormal involuntary movements(781.0)    Right hand   Allergy     Anemia    Anxiety    Asthma    Depression    Disturbance of skin sensation    Right arm   GERD (gastroesophageal reflux disease)    Headache(784.0)    Hypertension    Tingling sensation 05/27/2013   Tremor of unknown origin 05/27/2013   Uterine fibroid    Vertigo      Past Surgical History:  Procedure Laterality Date   HERNIA REPAIR     HYSTERECTOMY ABDOMINAL WITH SALPINGECTOMY Bilateral 05/07/2020   Procedure: EXPLORATORY LAPAROTOMY,TOTAL HYSTERECTOMY ABDOMINAL WITH BILATERAL  SALPINGECTOMY GREATER THAN 250GM;  Surgeon: Eloy Herring, MD;  Location: [facility];  Service: Gynecology;  Laterality: Bilateral;   MYOMECTOMY     OMENTECTOMY N/A 05/07/2020   Procedure: OMENTECTOMY, RADICAL TUMOR DEBULKING;  Surgeon: Eloy Herring, MD;  Location: [facility];  Service: Gynecology;  Laterality: N/A;  TOTAL ABDOMINAL HYSTERECTOMY Bilateral 05/07/2020    Family History  Problem Relation Age of Onset   Hypertension Mother    Arthritis Mother    Varicose Veins Mother    Drug abuse Father    Diabetes Maternal Grandmother    Asthma Maternal Grandmother    Obesity Maternal Grandmother    Narcolepsy Maternal Grandmother    Diabetes Maternal Grandfather    COPD Paternal Grandmother    Obesity Paternal Grandmother    Stroke Paternal Grandfather    Breast cancer Maternal Aunt    Asthma  Maternal Aunt    Cancer Maternal Aunt    COPD Maternal Aunt    Breast cancer Paternal Aunt    Alcohol abuse Paternal Aunt    Cancer Paternal Aunt    Bipolar disorder Cousin    Sleep apnea Neg Hx     Social History   Occupational History   Occupation: call center rep    Employer: [employer]  Tobacco Use   Smoking status: Never    Passive exposure: Never   Smokeless tobacco: Never  Vaping Use   Vaping status: Never Used  Substance and Sexual Activity   Alcohol use: Not Currently   Drug use: No   Sexual activity: Not Currently    Partners: Male    Birth control/protection: Surgical     ROS   Objective:   Vitals: BP (!) 154/99 (BP Location: Right Arm)   Pulse 85   Temp 99.7 F (37.6 C) (Oral)   Resp 16   LMP 04/13/2020   SpO2 95%   Physical Exam Constitutional:      General: She is not in acute distress.    Appearance: Normal appearance. She is well-developed and normal weight. She is not ill-appearing, toxic-appearing or diaphoretic.  HENT:     Head: Normocephalic and atraumatic.     Right Ear: Tympanic membrane, ear canal and external ear normal. No drainage or tenderness. No middle ear effusion. There is no impacted cerumen. Tympanic membrane is not erythematous or bulging.     Left Ear: Tympanic membrane, ear canal and external ear normal. No drainage or tenderness.  No middle ear effusion. There is no impacted cerumen. Tympanic membrane is not erythematous or bulging.     Nose: Congestion present. No rhinorrhea.     Mouth/Throat:     Mouth: Mucous membranes are moist. No oral lesions.     Pharynx: No pharyngeal swelling, oropharyngeal exudate, posterior oropharyngeal erythema or uvula swelling.     Tonsils: No tonsillar exudate or tonsillar abscesses.     Comments: Thick streaks of postnasal drainage overlying pharynx.  Eyes:     General: No scleral icterus.       Right eye: No discharge.        Left eye: No discharge.     Extraocular Movements: Extraocular  movements intact.     Right eye: Normal extraocular motion.     Left eye: Normal extraocular motion.     Conjunctiva/sclera: Conjunctivae normal.  Cardiovascular:     Rate and Rhythm: Normal rate and regular rhythm.     Heart sounds: Normal heart sounds. No murmur heard.    No friction rub. No gallop.  Pulmonary:     Effort: Pulmonary effort is normal. No respiratory distress.     Breath sounds: No stridor. No wheezing, rhonchi or rales.  Chest:     Chest wall: No tenderness.  Musculoskeletal:     Cervical back: Normal range of motion and  neck supple.  Lymphadenopathy:     Cervical: No cervical adenopathy.  Skin:    General: Skin is warm and dry.  Neurological:     General: No focal deficit present.     Mental Status: She is alert and oriented to person, place, and time.  Psychiatric:        Mood and Affect: Mood normal.        Behavior: Behavior normal.     Assessment and Plan :   PDMP not reviewed this encounter.  1. Acute non-recurrent sinusitis of other sinus      Will start empiric treatment for sinusitis with Augmentin .  Recommended supportive care otherwise. Counseled patient on potential for adverse effects with medications prescribed/recommended today, ER and return-to-clinic precautions discussed, patient verbalized understanding.     [1]  Allergies Allergen Reactions   Latex Itching     Christopher Savannah, PA-C 07/10/24 1757  "

## 2024-07-10 NOTE — Discharge Instructions (Signed)
 We will manage this as a sinus infection with Augmentin . For sore throat or cough try using a honey-based tea. Use 3 teaspoons of honey with juice squeezed from half lemon. Place shaved pieces of ginger into 1/2-1 cup of water and warm over stove top. Then mix the ingredients and repeat every 4 hours as needed. Please take Tylenol  500mg -650mg  every 6 hours for throat pain, fevers, aches and pains. Hydrate very well with at least 2 liters of water. Eat light meals such as soups (chicken and noodles, vegetable, chicken and wild rice).  Do not eat foods that you are allergic to.  Taking an antihistamine like Zyrtec can help against postnasal drainage, sinus congestion which can cause sinus pain, sinus headaches, throat pain, painful swallowing, coughing.  You can take this together with prednisone  for your sinus inflammation.  Use cough medication as needed.

## 2024-07-10 NOTE — ED Triage Notes (Signed)
 Pt reports pressure in left ear and nasal pressure x 1 week. Pt taking prednisone  (prescribed for cough 1 week ago), tomorrow will be the last dose, gives no relief. [name]

## 2024-12-16 ENCOUNTER — Encounter: Admitting: Urgent Care
